# Patient Record
Sex: Female | Born: 1935 | Race: White | Hispanic: No | Smoking: Former smoker
Health system: Southern US, Community
[De-identification: ages and names within clinical notes are randomized; demographics above are authoritative.]

## PROBLEM LIST (undated history)

## (undated) DIAGNOSIS — I700 Atherosclerosis of aorta: Secondary | ICD-10-CM

## (undated) DIAGNOSIS — M1990 Unspecified osteoarthritis, unspecified site: Secondary | ICD-10-CM

## (undated) DIAGNOSIS — I5032 Chronic diastolic (congestive) heart failure: Secondary | ICD-10-CM

## (undated) DIAGNOSIS — I499 Cardiac arrhythmia, unspecified: Secondary | ICD-10-CM

## (undated) DIAGNOSIS — Z8719 Personal history of other diseases of the digestive system: Secondary | ICD-10-CM

## (undated) DIAGNOSIS — C801 Malignant (primary) neoplasm, unspecified: Secondary | ICD-10-CM

## (undated) DIAGNOSIS — I509 Heart failure, unspecified: Secondary | ICD-10-CM

## (undated) DIAGNOSIS — I4891 Unspecified atrial fibrillation: Secondary | ICD-10-CM

## (undated) DIAGNOSIS — I10 Essential (primary) hypertension: Secondary | ICD-10-CM

## (undated) DIAGNOSIS — I619 Nontraumatic intracerebral hemorrhage, unspecified: Secondary | ICD-10-CM

## (undated) DIAGNOSIS — K219 Gastro-esophageal reflux disease without esophagitis: Secondary | ICD-10-CM

## (undated) DIAGNOSIS — M4800 Spinal stenosis, site unspecified: Secondary | ICD-10-CM

## (undated) DIAGNOSIS — F419 Anxiety disorder, unspecified: Secondary | ICD-10-CM

## (undated) DIAGNOSIS — I639 Cerebral infarction, unspecified: Secondary | ICD-10-CM

## (undated) HISTORY — PX: HIP SURGERY: SHX245

## (undated) HISTORY — PX: CARPAL TUNNEL RELEASE: SHX101

## (undated) HISTORY — PX: KNEE SURGERY: SHX244

## (undated) HISTORY — PX: TONSILLECTOMY: SUR1361

## (undated) HISTORY — PX: ABDOMINAL HYSTERECTOMY: SHX81

## (undated) HISTORY — PX: ROTATOR CUFF REPAIR: SHX139

## (undated) HISTORY — PX: CHOLECYSTECTOMY: SHX55

## (undated) HISTORY — PX: KIDNEY SURGERY: SHX687

---

## 2004-08-25 ENCOUNTER — Other Ambulatory Visit: Payer: Self-pay

## 2004-08-31 ENCOUNTER — Ambulatory Visit: Payer: Self-pay | Admitting: Unknown Physician Specialty

## 2004-09-26 ENCOUNTER — Encounter: Payer: Self-pay | Admitting: Rheumatology

## 2004-09-27 ENCOUNTER — Encounter: Payer: Self-pay | Admitting: Rheumatology

## 2004-10-27 ENCOUNTER — Encounter: Payer: Self-pay | Admitting: Rheumatology

## 2004-11-27 ENCOUNTER — Encounter: Payer: Self-pay | Admitting: Rheumatology

## 2004-12-28 ENCOUNTER — Encounter: Payer: Self-pay | Admitting: Rheumatology

## 2005-01-27 ENCOUNTER — Ambulatory Visit: Payer: Self-pay | Admitting: Unknown Physician Specialty

## 2005-04-06 ENCOUNTER — Ambulatory Visit: Payer: Self-pay | Admitting: Internal Medicine

## 2005-05-04 ENCOUNTER — Ambulatory Visit: Payer: Self-pay | Admitting: Unknown Physician Specialty

## 2006-06-01 ENCOUNTER — Ambulatory Visit: Payer: Self-pay

## 2006-06-05 ENCOUNTER — Ambulatory Visit: Payer: Self-pay | Admitting: Internal Medicine

## 2006-06-07 ENCOUNTER — Ambulatory Visit: Payer: Self-pay | Admitting: Internal Medicine

## 2006-12-10 ENCOUNTER — Ambulatory Visit: Payer: Self-pay | Admitting: Internal Medicine

## 2007-12-25 ENCOUNTER — Ambulatory Visit: Payer: Self-pay | Admitting: Internal Medicine

## 2008-06-17 ENCOUNTER — Ambulatory Visit: Payer: Self-pay | Admitting: Unknown Physician Specialty

## 2009-01-04 ENCOUNTER — Ambulatory Visit: Payer: Self-pay | Admitting: Internal Medicine

## 2010-01-19 ENCOUNTER — Ambulatory Visit: Payer: Self-pay | Admitting: Internal Medicine

## 2010-10-08 ENCOUNTER — Emergency Department: Payer: Self-pay | Admitting: Emergency Medicine

## 2010-11-24 ENCOUNTER — Ambulatory Visit: Payer: Self-pay | Admitting: General Surgery

## 2010-11-25 LAB — PATHOLOGY REPORT

## 2011-02-07 ENCOUNTER — Ambulatory Visit: Payer: Self-pay | Admitting: Internal Medicine

## 2011-11-23 ENCOUNTER — Encounter: Payer: Self-pay | Admitting: Internal Medicine

## 2011-11-28 ENCOUNTER — Encounter: Payer: Self-pay | Admitting: Internal Medicine

## 2012-02-06 ENCOUNTER — Ambulatory Visit: Payer: Self-pay | Admitting: Physical Medicine and Rehabilitation

## 2012-05-15 ENCOUNTER — Ambulatory Visit: Payer: Self-pay | Admitting: Internal Medicine

## 2012-11-18 ENCOUNTER — Emergency Department: Payer: Self-pay | Admitting: Emergency Medicine

## 2012-11-19 LAB — BASIC METABOLIC PANEL
Anion Gap: 11 (ref 7–16)
BUN: 20 mg/dL — ABNORMAL HIGH (ref 7–18)
Chloride: 108 mmol/L — ABNORMAL HIGH (ref 98–107)
Co2: 22 mmol/L (ref 21–32)
Creatinine: 0.96 mg/dL (ref 0.60–1.30)
EGFR (Non-African Amer.): 55 — ABNORMAL LOW
Osmolality: 284 (ref 275–301)
Potassium: 4.1 mmol/L (ref 3.5–5.1)

## 2012-11-19 LAB — CBC
HGB: 12.9 g/dL (ref 12.0–16.0)
MCH: 31.5 pg (ref 26.0–34.0)
MCHC: 33.7 g/dL (ref 32.0–36.0)
Platelet: 208 10*3/uL (ref 150–440)
RBC: 4.1 10*6/uL (ref 3.80–5.20)
WBC: 8.2 10*3/uL (ref 3.6–11.0)

## 2012-11-19 LAB — CK TOTAL AND CKMB (NOT AT ARMC): CK, Total: 53 U/L (ref 21–215)

## 2012-11-19 LAB — TROPONIN I: Troponin-I: 0.02 ng/mL

## 2013-03-06 ENCOUNTER — Ambulatory Visit: Payer: Self-pay | Admitting: General Practice

## 2013-03-06 LAB — BASIC METABOLIC PANEL
Anion Gap: 4 — ABNORMAL LOW (ref 7–16)
BUN: 21 mg/dL — ABNORMAL HIGH (ref 7–18)
Calcium, Total: 9.1 mg/dL (ref 8.5–10.1)
Chloride: 100 mmol/L (ref 98–107)
EGFR (African American): >60
EGFR (Non-African Amer.): >60
Glucose: 127 mg/dL — ABNORMAL HIGH (ref 65–99)
Osmolality: 277 (ref 275–301)
Potassium: 3.5 mmol/L (ref 3.5–5.1)

## 2013-03-06 LAB — HEMOGLOBIN: HGB: 13.4 g/dL (ref 12.0–16.0)

## 2013-03-12 ENCOUNTER — Ambulatory Visit: Payer: Self-pay | Admitting: General Practice

## 2013-05-28 ENCOUNTER — Ambulatory Visit: Payer: Self-pay | Admitting: Internal Medicine

## 2013-09-17 ENCOUNTER — Ambulatory Visit: Payer: Self-pay | Admitting: Unknown Physician Specialty

## 2014-06-02 ENCOUNTER — Ambulatory Visit: Payer: Self-pay | Admitting: Internal Medicine

## 2014-06-04 ENCOUNTER — Ambulatory Visit: Payer: Self-pay | Admitting: Internal Medicine

## 2014-06-18 ENCOUNTER — Ambulatory Visit: Payer: Self-pay | Admitting: General Practice

## 2014-06-30 ENCOUNTER — Emergency Department: Payer: Self-pay | Admitting: Emergency Medicine

## 2014-06-30 LAB — BASIC METABOLIC PANEL
Anion Gap: 10 (ref 7–16)
BUN: 14 mg/dL (ref 7–18)
CALCIUM: 9.1 mg/dL (ref 8.5–10.1)
CHLORIDE: 98 mmol/L (ref 98–107)
CREATININE: 0.86 mg/dL (ref 0.60–1.30)
Co2: 27 mmol/L (ref 21–32)
EGFR (African American): >60
EGFR (Non-African Amer.): >60
GLUCOSE: 104 mg/dL — AB (ref 65–99)
Osmolality: 271 (ref 275–301)
POTASSIUM: 3.7 mmol/L (ref 3.5–5.1)
Sodium: 135 mmol/L — ABNORMAL LOW (ref 136–145)

## 2014-06-30 LAB — CBC
HCT: 38.4 % (ref 35.0–47.0)
HGB: 12.8 g/dL (ref 12.0–16.0)
MCH: 31.8 pg (ref 26.0–34.0)
MCHC: 33.3 g/dL (ref 32.0–36.0)
MCV: 96 fL (ref 80–100)
PLATELETS: 193 10*3/uL (ref 150–440)
RBC: 4.01 10*6/uL (ref 3.80–5.20)
RDW: 13 % (ref 11.5–14.5)
WBC: 6 10*3/uL (ref 3.6–11.0)

## 2014-06-30 LAB — TROPONIN I

## 2014-06-30 LAB — PRO B NATRIURETIC PEPTIDE: B-Type Natriuretic Peptide: 1521 pg/mL — ABNORMAL HIGH (ref 0–450)

## 2014-08-19 ENCOUNTER — Ambulatory Visit: Payer: Self-pay | Admitting: General Practice

## 2014-08-19 LAB — CBC WITH DIFFERENTIAL/PLATELET
BASOS PCT: 0.4 %
Basophil #: 0 10*3/uL (ref 0.0–0.1)
EOS PCT: 0.3 %
Eosinophil #: 0 10*3/uL (ref 0.0–0.7)
HCT: 38.9 % (ref 35.0–47.0)
HGB: 12.9 g/dL (ref 12.0–16.0)
LYMPHS ABS: 1.7 10*3/uL (ref 1.0–3.6)
Lymphocyte %: 27.7 %
MCH: 31.4 pg (ref 26.0–34.0)
MCHC: 33.1 g/dL (ref 32.0–36.0)
MCV: 95 fL (ref 80–100)
MONO ABS: 0.6 x10 3/mm (ref 0.2–0.9)
Monocyte %: 9.2 %
NEUTROS ABS: 3.8 10*3/uL (ref 1.4–6.5)
Neutrophil %: 62.4 %
PLATELETS: 221 10*3/uL (ref 150–440)
RBC: 4.11 10*6/uL (ref 3.80–5.20)
RDW: 13.3 % (ref 11.5–14.5)
WBC: 6.2 10*3/uL (ref 3.6–11.0)

## 2014-08-26 ENCOUNTER — Ambulatory Visit: Payer: Self-pay | Admitting: General Practice

## 2015-03-19 NOTE — Op Note (Signed)
PATIENT NAME:  Theresa Harris, Theresa Harris MR#:  [id number] DATE OF BIRTH:  07-Apr-1927  DATE OF PROCEDURE:  03/12/2013  PREOPERATIVE DIAGNOSIS: Left carpal tunnel syndrome.   POSTOPERATIVE DIAGNOSIS: Left carpal tunnel syndrome.  PROCEDURE PERFORMED: Left carpal tunnel release.   SURGEON: Laurice Record. Hooten, MD  ANESTHESIA: General.   ESTIMATED BLOOD LOSS: Minimal.   FLUIDS REPLACED:  550 mL of crystalloid.   DRAINS: None.   TOURNIQUET TIME:  26 minutes.   INDICATIONS FOR SURGERY:   The patient is an 81 year old female who has been seen for complaints of left hand numbness and tingling. She demonstrated decreased sensation in the median nerve distribution with no significant improvement despite splinting. After discussion of the risks and benefits of surgical intervention, the patient expressed understanding of the risks, benefits and agreed with plans for left carpal tunnel release.   PROCEDURE IN DETAIL: The patient was brought into the operating room and after adequate general anesthesia was achieved, a tourniquet was placed on the patient's upper left arm. The patient's left hand and arm were cleaned and prepped with alcohol and DuraPrep draped in the usual sterile fashion. A "timeout" was performed as per usual protocol. Loupe magnification was used throughout the procedure. The left upper extremity was exsanguinated using an Esmarch, and the tourniquet was inflated to 250 mmHg. Curvilinear incision was made just ulnar to the thenar palmar crease. Dissection was carried down through the palmar fascia to the transverse carpal ligament. The transverse carpal ligament was sharply incised, taking care to protect the underlying structures within the carpal tunnel.  A complete release of the transverse carpal ligament was achieved. Inspection of the carpal tunnel and showed no evidence of ganglion cyst or lipoma. The median nerve had an hourglass type appearance under the extent of the transverse carpal  ligament and this improved after decompression.   The wound was irrigated with copious amounts of normal saline with antibiotic solution. Skin edges were reapproximated using interrupted sutures of 5-0 nylon. 10 mL of 0.25% Marcaine were injected along the incision line. A sterile dressing was applied followed by application of a volar splint. Tourniquet was deflated after total tourniquet time of 26 minutes. The patient tolerated procedure well. She was transported to the recovery room in stable condition.     ____________________________ Laurice Record. Holley Bouche., MD jph:ct D: 03/12/2013 08:43:51 ET T: 03/12/2013 09:00:07 ET JOB#: [id number]  cc: Jeneen Rinks P. Holley Bouche., MD, <Dictator> JAMES P Holley Bouche MD ELECTRONICALLY SIGNED 03/12/2013 20:19

## 2015-03-20 NOTE — Consult Note (Signed)
PATIENT NAME:  Theresa Harris, Theresa Harris MR#:  [id number] DATE OF BIRTH:  1927-01-24  DATE OF CONSULTATION:  08/26/2014   CONSULTING PHYSICIAN:  Epifanio Lesches, MD  PRIMARY DOCTOR:  Dr. Ouida Sills.    CONSULTATION REQUESTING PHYSICIAN:  Dr. Marry Guan.    REASON FOR CONSULTATION:  Atrial fibrillation.    HISTORY OF PRESENT ILLNESS: The patient is an 81 year old female patient with right knee arthroscopy seen by Dr. Marry Guan, supposed to go home, developed atrial fibrillation. The patient never had RVR and heart rate is controlled around 60 and the patient's rhythm strips showed she developed atrial fibrillation in PACU. The patient is asymptomatic and she is a recovering from arthroscopy. The patient's vitals, blood pressure is 170/80. Rhythm strips reviewed, she had multiple rhythm strips and EKG, EKG showed atrial fibrillation and rate of 68 beats per minute. The patient EKG results reviewed and we contacted Dr. Clayborn Bigness, he recommended that the patient can go home on aspirin 325 mg daily in addition to her medications and they will see the patient in the office. The patient's primary cardiologist is Dr. Ubaldo Glassing.   PAST MEDICAL HISTORY: Significant for hypertension, anxiety, history of ventricular bigeminy before.    ALLERGIES: MORPHINE.   PAST SURGICAL HISTORY: Significant for hysterectomy, cataract operation, gallbladder surgery, right kidney operation, appendectomy.   MEDICATIONS:  At home she is on Tylenol 500 mg every 4 hours as needed for pain, estradiol 1 mg p.o. daily, HCTZ 25 mg p.o. daily, magnesium 250 mg p.o. daily, metoprolol succinate 25 mg p.o. every other day, omeprazole 20 mg p.o. by mouth daily, oxazepam 15 mg p.o. daily as needed for anxiety, triamcinolone nasal spray 2 sprays in nostril daily.   FAMILY HISTORY: No hypertension or diabetes.   REVIEW OF SYSTEMS.  CONSTITUTIONAL: Denies any fever or fatigue.  EYES: No blurred vision. EARS, NOSE, AND THROAT: No tinnitus. No ear pain. No  epistaxis. No difficulty swallowing.  RESPIRATIONS: No cough. No wheezing.  CARDIOVASCULAR: No chest pain.  No orthopnea.  GASTROINTESTINAL: No nausea.  No vomiting. No abdominal pain.   GENITOURINARY: No dysuria.  ENDOCRINE: No polyuria or nocturia.  HEMATOLOGIC: No anemia.  INTEGUMENTARY: No skin rash.   MUSCULOSKELETAL:  The patient had a right knee arthroscopic.  PSYCHIATRIC: Denies any history of anxiety.   PHYSICAL EXAMINATION:  VITAL SIGNS: Temperature 98 Fahrenheit, heart rate around 64, blood pressure 170/60. The patient's heart rate averages around 60.   GENERAL:  Alert, awake, oriented, elderly female, not in distress, answering questions appropriately.  HEENT: Head is normocephalic.  EYES: Pupils equal, reacting to light. Extraocular movements are intact.  EARS, NOSE, AND THROAT: No turbinate hypertrophy.  No tympanic membrane congestion. No lymphadenopathy.  NECK: Supple. No JVD. No carotid bruit.   CARDIOVASCULAR: S1, S2 irregularly irregular.  Good pedal pulses present. No peripheral edema.  ABDOMEN: Soft, nontender, nondistended. Bowel sounds present.  MUSCULOSKELETAL: Strength 5/5 in upper and lower extremities. No skin rashes.  NEUROLOGIC: Cranial nerves II-XII intact. 4 out of 5 upper and lower extremity strength. Sensation intact. DTRs 2 + bilaterally.  PSYCHIATRIC: The patient is slightly anxious.   EKG showed atrial fibrillation with rate of 64 beats per minute.   ASSESSMENT AND PLAN: The patient is an 81 year old female with atrial fibrillation postoperatively. The patient's rate is controlled and she is asymptomatic and hemodynamically stable. I spoke with Dr. Clayborn Bigness, he recommended to discharge her and he will see her in the office and he also said the patient can have aspirin 325  mg daily in addition to her medications. Her CHADS2 score is 2.  The patient will be seen by Dr. Ubaldo Glassing or Dr. Clayborn Bigness in the office tomorrow and we have faxed the EKG to his office  number.  The patient's previous EKGs are compared and she has a history of ventricular bigeminy and some PVCs before on this EKG when compared with August 4 EKG.    TIME SPENT: About 50 minutes.   The patient can be discharged from PACU.    ____________________________ Epifanio Lesches, MD sk:bu D: 08/26/2014 18:39:01 ET T: 08/26/2014 19:20:06 ET JOB#: [id number]  cc: Epifanio Lesches, MD, <Dictator> Epifanio Lesches MD ELECTRONICALLY SIGNED 09/19/2014 18:34

## 2015-03-20 NOTE — Op Note (Signed)
PATIENT NAME:  Theresa Harris, HAGANS MR#:  [id number] DATE OF BIRTH:  [birth year]  DATE OF PROCEDURE:  08/26/2014  PREOPERATIVE DIAGNOSIS: Internal derangement of the right knee.   POSTOPERATIVE DIAGNOSES: 1.  Tear of the anterior and posterior horns of the lateral meniscus, right knee.  2.  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   PROCEDURES PERFORMED: Right knee arthroscopy, partial lateral meniscectomy, and medial chondroplasty.   SURGEON: Laurice Record. Holley Bouche., MD.   ANESTHESIA: General.   ESTIMATED BLOOD LOSS: Minimal.   TOURNIQUET TIME: Not used.   DRAINS: None.   INDICATIONS FOR SURGERY: The patient is an 81 year old female who has been seen for complaints of right knee pain and swelling. MRI demonstrated findings suggestive of meniscal pathology. After discussion of risks and benefits of surgical intervention, the patient expressed understanding of the risks, benefits, and agreed with plans for surgical intervention.   PROCEDURE IN DETAIL: The patient was brought to the Operating Room and after adequate general anesthesia was achieved, a tourniquet was placed on the patient's right thigh, and the leg was placed in a leg holder. All bony prominences were well padded. The patient's right knee and leg were cleaned and prepped with alcohol and DuraPrep draped in the usual sterile fashion. A "timeout" was performed as per usual protocol. The anticipated portal sites were injected with 0.25% Marcaine with epinephrine. An anterolateral portal was created and a cannula was inserted. A moderate effusion was evacuated. The scope was inserted and the knee was distended with fluid using the pump. The scope was advanced down the medial gutter into the medial compartment of the knee. Under visualization with the scope, an anteromedial portal was created and a hook probe was inserted. Inspection of the medial compartment demonstrated some grade 2 to early grade 3 changes of  chondromalacia involving both the femur and tibia. These areas were debrided and contoured using the 50-degree ArthroCare wand. The medial meniscus was visualized and probed and felt to be stable. Some mild fraying was noted along the inner aspect of the meniscus, and this was freed using the 50-degree ArthroCare wand. The scope was advanced into the intracondylar region. The anterior cruciate ligament was visualized and probed and felt to be stable. The scope was removed from the anterolateral portal and reinserted via the anteromedial portal so as to better visualize the lateral compartment. There was a complex tear of the lateral meniscus involving both the anterior and posterior horns. The tear was debrided using meniscal punches and a 4.5 mm shaver. Additional contouring and debridement was performed using a 50-degree ArthroCare wand. The remaining rim of meniscus was visualized and probed and felt to be stable. There was some mild fraying of the articular surface. This was debrided using the ArthroCare wand. Finally, the scope was positioned so as to visualize the patellofemoral articulation. Mild chondral changes were noted. Good patellar tracking was appreciated.   The knee was irrigated with copious amounts of fluid and then suctioned dry. The anterolateral portal was reapproximated using 3-0 nylon. A combination of 0.25% Marcaine with epinephrine and 4 mg morphine was injected via the scope. The scope was removed and the anteromedial portal was reapproximated using 3-0 nylon. A sterile dressing was applied followed by application of an ice wrap. The patient tolerated the procedure well. She was transported to the recovery room in stable condition.   ____________________________ Laurice Record. Holley Bouche., MD jph:at D: 08/26/2014 18:40:05 ET T: 08/26/2014 18:56:34 ET JOB#: [id number]  cc: Laurice Record. Holley Bouche., MD, <Dictator> JAMES P Holley Bouche MD ELECTRONICALLY SIGNED 08/27/2014 23:14

## 2015-10-22 ENCOUNTER — Emergency Department: Payer: PPO

## 2015-10-22 ENCOUNTER — Inpatient Hospital Stay (HOSPITAL_COMMUNITY)
Admission: AD | Admit: 2015-10-22 | Discharge: 2015-10-27 | DRG: 065 | Disposition: A | Discharge status: Rehabilitation Facility | Payer: PPO | Source: Other Acute Inpatient Hospital | Attending: Neurology | Admitting: Neurology

## 2015-10-22 ENCOUNTER — Encounter: Payer: Self-pay | Admitting: Emergency Medicine

## 2015-10-22 ENCOUNTER — Emergency Department
Admission: EM | Admit: 2015-10-22 | Discharge: 2015-10-22 | Disposition: A | Discharge status: Other Acute Inpatient Hospital | Payer: PPO | Attending: Emergency Medicine | Admitting: Emergency Medicine

## 2015-10-22 DIAGNOSIS — Y9389 Activity, other specified: Secondary | ICD-10-CM | POA: Insufficient documentation

## 2015-10-22 DIAGNOSIS — Z79899 Other long term (current) drug therapy: Secondary | ICD-10-CM

## 2015-10-22 DIAGNOSIS — I639 Cerebral infarction, unspecified: Secondary | ICD-10-CM

## 2015-10-22 DIAGNOSIS — D329 Benign neoplasm of meninges, unspecified: Secondary | ICD-10-CM

## 2015-10-22 DIAGNOSIS — Z79890 Hormone replacement therapy (postmenopausal): Secondary | ICD-10-CM

## 2015-10-22 DIAGNOSIS — D320 Benign neoplasm of cerebral meninges: Secondary | ICD-10-CM | POA: Diagnosis present

## 2015-10-22 DIAGNOSIS — I5022 Chronic systolic (congestive) heart failure: Secondary | ICD-10-CM | POA: Diagnosis present

## 2015-10-22 DIAGNOSIS — I5032 Chronic diastolic (congestive) heart failure: Secondary | ICD-10-CM | POA: Diagnosis not present

## 2015-10-22 DIAGNOSIS — R197 Diarrhea, unspecified: Secondary | ICD-10-CM | POA: Diagnosis present

## 2015-10-22 DIAGNOSIS — S8991XA Unspecified injury of right lower leg, initial encounter: Secondary | ICD-10-CM | POA: Insufficient documentation

## 2015-10-22 DIAGNOSIS — I610 Nontraumatic intracerebral hemorrhage in hemisphere, subcortical: Principal | ICD-10-CM | POA: Diagnosis present

## 2015-10-22 DIAGNOSIS — S8992XA Unspecified injury of left lower leg, initial encounter: Secondary | ICD-10-CM | POA: Insufficient documentation

## 2015-10-22 DIAGNOSIS — S199XXA Unspecified injury of neck, initial encounter: Secondary | ICD-10-CM | POA: Diagnosis not present

## 2015-10-22 DIAGNOSIS — F419 Anxiety disorder, unspecified: Secondary | ICD-10-CM | POA: Diagnosis present

## 2015-10-22 DIAGNOSIS — I10 Essential (primary) hypertension: Secondary | ICD-10-CM | POA: Diagnosis present

## 2015-10-22 DIAGNOSIS — M4800 Spinal stenosis, site unspecified: Secondary | ICD-10-CM | POA: Diagnosis present

## 2015-10-22 DIAGNOSIS — R29703 NIHSS score 3: Secondary | ICD-10-CM | POA: Diagnosis present

## 2015-10-22 DIAGNOSIS — Z96642 Presence of left artificial hip joint: Secondary | ICD-10-CM | POA: Diagnosis not present

## 2015-10-22 DIAGNOSIS — I671 Cerebral aneurysm, nonruptured: Secondary | ICD-10-CM

## 2015-10-22 DIAGNOSIS — R270 Ataxia, unspecified: Secondary | ICD-10-CM | POA: Diagnosis not present

## 2015-10-22 DIAGNOSIS — R739 Hyperglycemia, unspecified: Secondary | ICD-10-CM | POA: Diagnosis present

## 2015-10-22 DIAGNOSIS — W11XXXA Fall on and from ladder, initial encounter: Secondary | ICD-10-CM | POA: Diagnosis not present

## 2015-10-22 DIAGNOSIS — Y9289 Other specified places as the place of occurrence of the external cause: Secondary | ICD-10-CM | POA: Diagnosis not present

## 2015-10-22 DIAGNOSIS — R52 Pain, unspecified: Secondary | ICD-10-CM

## 2015-10-22 DIAGNOSIS — I6789 Other cerebrovascular disease: Secondary | ICD-10-CM | POA: Diagnosis not present

## 2015-10-22 DIAGNOSIS — M6281 Muscle weakness (generalized): Secondary | ICD-10-CM | POA: Diagnosis present

## 2015-10-22 DIAGNOSIS — S4992XA Unspecified injury of left shoulder and upper arm, initial encounter: Secondary | ICD-10-CM | POA: Insufficient documentation

## 2015-10-22 DIAGNOSIS — S4991XA Unspecified injury of right shoulder and upper arm, initial encounter: Secondary | ICD-10-CM | POA: Diagnosis not present

## 2015-10-22 DIAGNOSIS — G4762 Sleep related leg cramps: Secondary | ICD-10-CM | POA: Diagnosis not present

## 2015-10-22 DIAGNOSIS — I69898 Other sequelae of other cerebrovascular disease: Secondary | ICD-10-CM | POA: Diagnosis not present

## 2015-10-22 DIAGNOSIS — I619 Nontraumatic intracerebral hemorrhage, unspecified: Secondary | ICD-10-CM | POA: Insufficient documentation

## 2015-10-22 DIAGNOSIS — Y998 Other external cause status: Secondary | ICD-10-CM | POA: Insufficient documentation

## 2015-10-22 DIAGNOSIS — Z87891 Personal history of nicotine dependence: Secondary | ICD-10-CM | POA: Diagnosis not present

## 2015-10-22 DIAGNOSIS — S06340A Traumatic hemorrhage of right cerebrum without loss of consciousness, initial encounter: Secondary | ICD-10-CM | POA: Diagnosis not present

## 2015-10-22 DIAGNOSIS — I509 Heart failure, unspecified: Secondary | ICD-10-CM | POA: Diagnosis not present

## 2015-10-22 DIAGNOSIS — R208 Other disturbances of skin sensation: Secondary | ICD-10-CM | POA: Diagnosis not present

## 2015-10-22 HISTORY — DX: Essential (primary) hypertension: I10

## 2015-10-22 HISTORY — DX: Heart failure, unspecified: I50.9

## 2015-10-22 HISTORY — DX: Anxiety disorder, unspecified: F41.9

## 2015-10-22 HISTORY — DX: Spinal stenosis, site unspecified: M48.00

## 2015-10-22 LAB — PROTIME-INR
INR: 1.08
PROTHROMBIN TIME: 14.2 s (ref 11.4–15.0)

## 2015-10-22 LAB — COMPREHENSIVE METABOLIC PANEL
ALBUMIN: 4 g/dL (ref 3.5–5.0)
ALT: 12 U/L — ABNORMAL LOW (ref 14–54)
ANION GAP: 8 (ref 5–15)
AST: 25 U/L (ref 15–41)
Alkaline Phosphatase: 54 U/L (ref 38–126)
BILIRUBIN TOTAL: 0.4 mg/dL (ref 0.3–1.2)
BUN: 20 mg/dL (ref 6–20)
CALCIUM: 9.4 mg/dL (ref 8.9–10.3)
CO2: 27 mmol/L (ref 22–32)
Chloride: 102 mmol/L (ref 101–111)
Creatinine, Ser: 0.72 mg/dL (ref 0.44–1.00)
Glucose, Bld: 141 mg/dL — ABNORMAL HIGH (ref 65–99)
POTASSIUM: 3.5 mmol/L (ref 3.5–5.1)
Sodium: 137 mmol/L (ref 135–145)
TOTAL PROTEIN: 7.2 g/dL (ref 6.5–8.1)

## 2015-10-22 LAB — DIFFERENTIAL
BASOS PCT: 0 %
Basophils Absolute: 0 10*3/uL (ref 0–0.1)
EOS ABS: 0.1 10*3/uL (ref 0–0.7)
Eosinophils Relative: 1 %
Lymphocytes Relative: 26 %
Lymphs Abs: 2 10*3/uL (ref 1.0–3.6)
MONO ABS: 0.8 10*3/uL (ref 0.2–0.9)
MONOS PCT: 11 %
NEUTROS ABS: 4.6 10*3/uL (ref 1.4–6.5)
Neutrophils Relative %: 62 %

## 2015-10-22 LAB — CBC
HEMATOCRIT: 39.1 % (ref 35.0–47.0)
Hemoglobin: 13 g/dL (ref 12.0–16.0)
MCH: 30.3 pg (ref 26.0–34.0)
MCHC: 33.2 g/dL (ref 32.0–36.0)
MCV: 91.3 fL (ref 80.0–100.0)
PLATELETS: 201 10*3/uL (ref 150–440)
RBC: 4.28 MIL/uL (ref 3.80–5.20)
RDW: 13.7 % (ref 11.5–14.5)
WBC: 7.4 10*3/uL (ref 3.6–11.0)

## 2015-10-22 LAB — MRSA PCR SCREENING: MRSA BY PCR: NEGATIVE

## 2015-10-22 LAB — APTT: aPTT: 31 seconds (ref 24–36)

## 2015-10-22 MED ORDER — ACETAMINOPHEN 650 MG RE SUPP: 650.0000 mg | RECTAL | Status: DC | PRN

## 2015-10-22 MED ORDER — SENNOSIDES-DOCUSATE SODIUM 8.6-50 MG PO TABS
1.0000 | ORAL_TABLET | Freq: Two times a day (BID) | ORAL | Status: DC
Start: 2015-10-22 — End: 2015-10-25
  Administered 2015-10-23: 1 via ORAL
  Filled 2015-10-22 (×4): qty 1

## 2015-10-22 MED ORDER — SODIUM CHLORIDE 0.9 % IV SOLN
INTRAVENOUS | Status: DC
  Administered 2015-10-22: 22:00:00 via INTRAVENOUS

## 2015-10-22 MED ORDER — STROKE: EARLY STAGES OF RECOVERY BOOK
Freq: Once | Status: DC
  Filled 2015-10-22: qty 1

## 2015-10-22 MED ORDER — LABETALOL HCL 5 MG/ML IV SOLN
10.0000 mg | Freq: Once | INTRAVENOUS | Status: AC
  Administered 2015-10-22: 10 mg via INTRAVENOUS
  Filled 2015-10-22: qty 4

## 2015-10-22 MED ORDER — LABETALOL HCL 5 MG/ML IV SOLN
10.0000 mg | INTRAVENOUS | Status: DC | PRN
  Administered 2015-10-23: 10 mg via INTRAVENOUS
  Administered 2015-10-23: 20 mg via INTRAVENOUS
  Filled 2015-10-22 (×2): qty 4

## 2015-10-22 MED ORDER — LORAZEPAM 2 MG/ML IJ SOLN
1.0000 mg | Freq: Once | INTRAMUSCULAR | Status: AC
  Administered 2015-10-22: 1 mg via INTRAVENOUS
  Filled 2015-10-22: qty 1

## 2015-10-22 MED ORDER — ACETAMINOPHEN 325 MG PO TABS
650.0000 mg | ORAL_TABLET | ORAL | Status: DC | PRN
  Administered 2015-10-22 – 2015-10-23 (×2): 650 mg via ORAL
  Administered 2015-10-24: 325 mg via ORAL
  Administered 2015-10-26: 650 mg via ORAL
  Filled 2015-10-22 (×4): qty 2

## 2015-10-22 MED ORDER — PANTOPRAZOLE SODIUM 40 MG IV SOLR
40.0000 mg | Freq: Every day | INTRAVENOUS | Status: DC
Start: 2015-10-22 — End: 2015-10-23
  Administered 2015-10-22: 40 mg via INTRAVENOUS
  Filled 2015-10-22: qty 40

## 2015-10-22 NOTE — ED Provider Notes (Signed)
Time Seen: Approximately 1610 I have reviewed the triage notes  Chief Complaint: Code Stroke   History of Present Illness: Theresa Harris is a 81 y.o. female who presents after she fell from a short small ladder. Patient states that she was putting stuff in a cabinet and her leg gave out. She initially had some left-sided weakness in both upper and lower extremity and some slurred speech. She still has trouble with left leg weakness however the speech and left arm weakness has corrected. She was apparently on the floor for an unknown period of time. She denies any loss of consciousness. She denies any thoracic or lumbar spine pain but does have some discomfort at the base of her cervical spine. She was transported here by EMS uneventfully. She denies any headache, nausea, vomiting. She denies being on any current blood thinners. Past Medical History  Diagnosis Date  . Spinal stenosis   . CHF (congestive heart failure) ([place])   . Anxiety   . HTN (hypertension)     Patient Active Problem List   Diagnosis Date Noted  . ICH (intracerebral hemorrhage) ([place]) 10/22/2015    Past Surgical History  Procedure Laterality Date  . Knee surgery      right  . Abdominal hysterectomy    . Hip surgery    . Cholecystectomy    . Carpal tunnel release      Past Surgical History  Procedure Laterality Date  . Knee surgery      right  . Abdominal hysterectomy    . Hip surgery    . Cholecystectomy    . Carpal tunnel release      No current outpatient prescriptions on file.  Allergies:  Morphine and related  Family History: No family history on file.  Social History: Social History  Substance Use Topics  . Smoking status: Former Research scientist (life sciences)  . Smokeless tobacco: None  . Alcohol Use: No     Review of Systems:   10 point review of systems was performed and was otherwise negative:  Constitutional: No fever Eyes: No visual disturbances ENT: No sore throat, ear pain Cardiac: No chest  pain Respiratory: No shortness of breath, wheezing, or stridor Abdomen: No abdominal pain, no vomiting, No diarrhea Endocrine: No weight loss, No night sweats Extremities: No peripheral edema, cyanosis Skin: No rashes, easy bruising Neurologic: Focal weakness mentioned above which is improving. She denies any trouble swallowing. Urologic: No dysuria, Hematuria, or urinary frequency   Physical Exam:  ED Triage Vitals  Enc Vitals Group     BP 10/22/15 1555 188/45 mmHg     Pulse Rate 10/22/15 1555 56     Resp 10/22/15 1555 13     Temp 10/22/15 1555 97.4 F (36.3 C)     Temp Source 10/22/15 1555 Oral     SpO2 10/22/15 1555 99 %     Weight 10/22/15 1555 126 lb (57.153 kg)     Height 10/22/15 1555 5' (1.524 m)     Head Cir --      Peak Flow --      Pain Score 10/22/15 1556 9     Pain Loc --      Pain Edu? --      Excl. in [place]? --     General: Awake , Alert , and Oriented times 3; GCS 49fcial asymmetry. Head: Normal cephalic , atraumatic Eyes: Pupils equal , round, reactive to light Nose/Throat: No nasal drainage, patent upper airway without erythema or exudate.  Neck: Mild tenderness at the base of the cervical spine with no crepitus or step-off noted. No neuropraxia Lungs: Clear to ascultation without wheezes , rhonchi, or rales Heart: Regular rate, regular rhythm without murmurs , gallops , or rubs Abdomen: Soft, non tender without rebound, guarding , or rigidity; bowel sounds positive and symmetric in all 4 quadrants. No organomegaly .        Extremities: 2 plus symmetric pulses. No edema, clubbing or cyanosis Neurologic: Patient has left lower extremity weakness compared to the right. The left upper and right upper extremity appeared abnormal 5 out of 5 strength. 3 out of 5 in the left lower extremity, 5 out of 5 in the right lower extremity. Skin: warm, dry, no rashes   Labs:   All laboratory work was reviewed including any pertinent negatives or positives listed below:   Labs Reviewed  COMPREHENSIVE METABOLIC PANEL - Abnormal; Notable for the following:    Glucose, Bld 141 (*)    ALT 12 (*)    All other components within normal limits  CBC  DIFFERENTIAL  PROTIME-INR  APTT  CBG MONITORING, ED   laboratory work was reviewed and shows no significant findings  EKG:  ED ECG REPORT I, Daymon Larsen, the attending physician, personally viewed and interpreted this ECG.  Date: 10/22/2015 EKG Time: 1549 Rate: 67 Rhythm: normal sinus rhythm QRS Axis: normal Intervals: Right bundle-branch block pattern ST/T Wave abnormalities: normal Conduction Disutrbances: none Narrative Interpretation: unremarkable No acute ischemic changes noted  Radiology: EXAM: CT HEAD WITHOUT CONTRAST  CT CERVICAL SPINE WITHOUT CONTRAST  TECHNIQUE: Multidetector CT imaging of the head and cervical spine was performed following the standard protocol without intravenous contrast. Multiplanar CT image reconstructions of the cervical spine were also generated.  COMPARISON: MRI of the cervical spine 02/06/2012.  FINDINGS: CT HEAD FINDINGS  An acute hemorrhagic infarct is present in the posterior limb right internal capsule and thalamus measuring 14 x 9 mm. No other acute hemorrhage is present. A calcified extra-axial mass adjacent of the right frontal lobe measures 15 mm, compatible with a meningioma. There is a remote lacunar infarct in the left lentiform nucleus.  Mild periventricular white matter hypoattenuation is present. There is blood in the right lateral ventricle without hydrocephalus.  Atherosclerotic calcifications are present in the cavernous internal carotid arteries bilaterally. The paranasal sinuses and mastoid air cells are clear. The calvarium is intact. No significant extracranial soft tissue lesions are present. Bilateral lens replacements are present. The globes and orbits are otherwise intact.  CT CERVICAL SPINE FINDINGS  The cervical spine  is imaged from the skullbase through T1-2. Vertebral body heights alignment are maintained. There is chronic loss of disc height at each level from C3-4 through C7-T1. Facet hypertrophy is noted. Osseous foraminal narrowing is present on the left at C3-4. Mild to moderate foraminal narrowing is present bilaterally from C4-5 through C7-T1. No acute fracture or traumatic subluxation is evident.  Atherosclerotic calcifications are present proximally in the great vessels. Atherosclerotic changes are also present at the carotid bifurcations bilaterally without definite stenosis. Scarring is present at the lung apices. The soft tissues are otherwise unremarkable. Calcifications within the posterior cervical musculature at C5 and C6 is likely related to prior trauma.  IMPRESSION: 1. 14 x 9 mm acute hemorrhagic infarct involving the posterior limb right internal capsule and right thalamus. This is likely a hypertensive hemorrhage. For 2. Extension of hemorrhage into the right lateral ventricle without hydrocephalus. 3. Remote nonhemorrhagic infarct of the  left lentiform nucleus. 4. 15 mm right frontal meningioma. 5. Advanced degenerative changes in the cervical spine without evidence for acute fracture or traumatic subluxation. These results were called by telephone at the time of interpretation on 10/22/2015 at 3:54 pm to Dr. Lenise Arena , who verbally acknowledged these results.   Electronically Signed       I personally reviewed the radiologic studies    Critical Care: CRITICAL CARE Performed by: Daymon Larsen   Total critical care time: 33 minutes minutes  Critical care time was exclusive of separately billable procedures and treating other patients.  Critical care was necessary to treat or prevent imminent or life-threatening deterioration.  Critical care was time spent personally by me on the following activities: development of treatment plan with patient  and/or surrogate as well as nursing, discussions with consultants, evaluation of patient's response to treatment, examination of patient, obtaining history from patient or surrogate, ordering and performing treatments and interventions, ordering and review of laboratory studies, ordering and review of radiographic studies, pulse oximetry and re-evaluation of patient's condition. Management of acute hemorrhagic stroke    ED Course: Patient arrives hypertensive and was given labetalol 10 mg IV. Patient was also given some Ativan which also seemed to help with her blood pressure. Her findings were reviewed with her at the bedside. She was initially called as a code stroke and had immediate head CT evaluation. Patient will require transfer and I spoke to the neurologist at [facility] accepted [facility]. Patient will be transferred by critical care unit.   Assessment:  Hemorrhagic stroke  Final Clinical Impression:  Final diagnoses:  Hemorrhagic stroke [facility])     Plan: * Transferred to [place], MD 10/22/15 [id number]

## 2015-10-22 NOTE — H&P (Signed)
Admission H&P    Chief Complaint: Numbness involving left side of the face and left upper extremity as well as weakness and coordination difficulty of left upper extremity.  HPI: Theresa Harris is an 81 y.o. female history of hypertension, congestive heart failure, spinal stenosis and anxiety, transferred from [facility] with an acute hemorrhage or parenchymal right cerebral hemorrhage. Patient was last known well at about 1300 today. She was standing on a step stool at the time of symptom onset and fell to her left side. CT scan of her head showed a small to parenchymal hemorrhage involving right thalamus and internal capsule. There was no significant mass effect. NIH stroke score was 3. Patient has no previous history of stroke or TIA.  LSN: 1 PM on 10/22/2015 tPA Given: No: Acute ICH mRankin:  Past Medical History  Diagnosis Date  . Spinal stenosis   . CHF (congestive heart failure) ([place])   . Anxiety   . HTN (hypertension)     Past Surgical History  Procedure Laterality Date  . Knee surgery      right  . Abdominal hysterectomy    . Hip surgery    . Cholecystectomy    . Carpal tunnel release      No family history on file. Social History:  reports that she has quit smoking. She does not have any smokeless tobacco history on file. She reports that she does not drink alcohol or use illicit drugs.  Allergies:  Allergies  Allergen Reactions  . Morphine And Related Nausea And Vomiting    Medications Prior to Admission  Medication Sig Dispense Refill  . acetaminophen (TYLENOL) 500 MG tablet Take 500 mg by mouth every 6 (six) hours as needed for mild pain.    Marland Kitchen b complex vitamins tablet Take 1 tablet by mouth daily.    . Calcium Carbonate-Vitamin D (CALCIUM 600+D) 600-400 MG-UNIT tablet Take 1 tablet by mouth 2 (two) times daily.    Marland Kitchen estradiol (ESTRACE) 1 MG tablet Take 0.5 mg by mouth daily.     . hydrochlorothiazide (HYDRODIURIL) 25 MG tablet Take 25 mg by mouth daily.    .  magnesium oxide (MAG-OX) 400 MG tablet Take 400 mg by mouth daily.    . metoprolol succinate (TOPROL-XL) 25 MG 24 hr tablet Take 25 mg by mouth 2 (two) times daily.    Marland Kitchen omeprazole (PRILOSEC) 20 MG capsule Take 20 mg by mouth 2 (two) times daily.    Marland Kitchen oxazepam (SERAX) 15 MG capsule Take 15 mg by mouth 3 (three) times daily as needed for sleep.    Marland Kitchen tiZANidine (ZANAFLEX) 2 MG tablet Take 2 mg by mouth every 8 (eight) hours as needed for muscle spasms.    Marland Kitchen triamcinolone (NASACORT ALLERGY 24HR) 55 MCG/ACT AERO nasal inhaler Place 2 sprays into the nose daily as needed (for rhinitis).      ROS: History obtained from the patient  General ROS: negative for - chills, fatigue, fever, night sweats, weight gain or weight loss Psychological ROS: negative for - behavioral disorder, hallucinations, memory difficulties, mood swings or suicidal ideation Ophthalmic ROS: negative for - blurry vision, double vision, eye pain or loss of vision ENT ROS: negative for - epistaxis, nasal discharge, oral lesions, sore throat, tinnitus or vertigo Allergy and Immunology ROS: negative for - hives or itchy/watery eyes Hematological and Lymphatic ROS: negative for - bleeding problems, bruising or swollen lymph nodes Endocrine ROS: negative for - galactorrhea, hair pattern changes, polydipsia/polyuria or temperature intolerance Respiratory  ROS: negative for - cough, hemoptysis, shortness of breath or wheezing Cardiovascular ROS: negative for - chest pain, dyspnea on exertion, edema or irregular heartbeat Gastrointestinal ROS: negative for - abdominal pain, diarrhea, hematemesis, nausea/vomiting or stool incontinence Genito-Urinary ROS: negative for - dysuria, hematuria, incontinence or urinary frequency/urgency Musculoskeletal ROS: negative for - joint swelling or muscular weakness Neurological ROS: as noted in HPI Dermatological ROS: negative for rash and skin lesion changes  Physical Examination: Blood pressure  153/68, pulse 101, resp. rate 14, height 5' (1.524 m), weight 58.7 kg (129 lb 6.6 oz), SpO2 97 %.  HEENT-  Normocephalic, no lesions, without obvious abnormality.  Normal external eye and conjunctiva.  Normal TM's bilaterally.  Normal auditory canals and external ears. Normal external nose, mucus membranes and septum.  Normal pharynx. Neck supple with no masses, nodes, nodules or enlargement. Cardiovascular - regular rate and rhythm, S1, S2 normal, no murmur, click, rub or gallop Lungs - chest clear, no wheezing, rales, normal symmetric air entry Abdomen - soft, non-tender; bowel sounds normal; no masses,  no organomegaly Extremities - no joint deformities, effusion, or inflammation and no edema  Neurologic Examination: Mental Status: Alert, oriented, thought content appropriate.  Speech fluent without evidence of aphasia. Able to follow commands without difficulty. Cranial Nerves: II-Visual fields were normal. III/IV/VI-Pupils were equal and reacted. Extraocular movements were full and conjugate.    V/VII-mild left lower facial numbness; no facial weakness. VIII-normal. X-normal speech and symmetrical palatal movement. XI: trapezius strength/neck flexion strength normal bilaterally XII-midline tongue extension with normal strength. Motor: Mild drift of left upper extremity as well as reduced grip strength of left hand; motor exam otherwise unremarkable Sensory: Reduced sensation involving left hand, compared to the right.. Deep Tendon Reflexes: 1+ and symmetric. Plantars: Flexor bilaterally Cerebellar: Moderately impaired coordination of upper extremity. Carotid auscultation: Normal  Results for orders placed or performed during the hospital encounter of 10/22/15 (from the past 48 hour(s))  CBC     Status: None   Collection Time: 10/22/15  5:12 PM  Result Value Ref Range   WBC 7.4 3.6 - 11.0 K/uL   RBC 4.28 3.80 - 5.20 MIL/uL   Hemoglobin 13.0 12.0 - 16.0 g/dL   HCT 39.1 35.0 -  47.0 %   MCV 91.3 80.0 - 100.0 fL   MCH 30.3 26.0 - 34.0 pg   MCHC 33.2 32.0 - 36.0 g/dL   RDW 13.7 11.5 - 14.5 %   Platelets 201 150 - 440 K/uL  Differential     Status: None   Collection Time: 10/22/15  5:12 PM  Result Value Ref Range   Neutrophils Relative % 62 %   Neutro Abs 4.6 1.4 - 6.5 K/uL   Lymphocytes Relative 26 %   Lymphs Abs 2.0 1.0 - 3.6 K/uL   Monocytes Relative 11 %   Monocytes Absolute 0.8 0.2 - 0.9 K/uL   Eosinophils Relative 1 %   Eosinophils Absolute 0.1 0 - 0.7 K/uL   Basophils Relative 0 %   Basophils Absolute 0.0 0 - 0.1 K/uL  Protime-INR     Status: None   Collection Time: 10/22/15  5:12 PM  Result Value Ref Range   Prothrombin Time 14.2 11.4 - 15.0 seconds   INR 1.08   APTT     Status: None   Collection Time: 10/22/15  5:12 PM  Result Value Ref Range   aPTT 31 24 - 36 seconds  Comprehensive metabolic panel     Status: Abnormal   Collection  Time: 10/22/15  5:12 PM  Result Value Ref Range   Sodium 137 135 - 145 mmol/L   Potassium 3.5 3.5 - 5.1 mmol/L   Chloride 102 101 - 111 mmol/L   CO2 27 22 - 32 mmol/L   Glucose, Bld 141 (H) 65 - 99 mg/dL   BUN 20 6 - 20 mg/dL   Creatinine, Ser 0.72 0.44 - 1.00 mg/dL   Calcium 9.4 8.9 - 10.3 mg/dL   Total Protein 7.2 6.5 - 8.1 g/dL   Albumin 4.0 3.5 - 5.0 g/dL   AST 25 15 - 41 U/L   ALT 12 (L) 14 - 54 U/L   Alkaline Phosphatase 54 38 - 126 U/L   Total Bilirubin 0.4 0.3 - 1.2 mg/dL   GFR calc non Af Amer >60 >60 mL/min   GFR calc Af Amer >60 >60 mL/min    Comment: (NOTE) The eGFR has been calculated using the CKD EPI equation. This calculation has not been validated in all clinical situations. eGFR's persistently <60 mL/min signify possible Chronic Kidney Disease.    Anion gap 8 5 - 15   Ct Head Wo Contrast  10/22/2015  CLINICAL DATA:  Acute onset of left-sided weakness and slurred speech beginning 3 hours ago. EXAM: CT HEAD WITHOUT CONTRAST CT CERVICAL SPINE WITHOUT CONTRAST TECHNIQUE: Multidetector  CT imaging of the head and cervical spine was performed following the standard protocol without intravenous contrast. Multiplanar CT image reconstructions of the cervical spine were also generated. COMPARISON:  MRI of the cervical spine 02/06/2012. FINDINGS: CT HEAD FINDINGS An acute hemorrhagic infarct is present in the posterior limb right internal capsule and thalamus measuring 14 x 9 mm. No other acute hemorrhage is present. A calcified extra-axial mass adjacent of the right frontal lobe measures 15 mm, compatible with a meningioma. There is a remote lacunar infarct in the left lentiform nucleus. Mild periventricular white matter hypoattenuation is present. There is blood in the right lateral ventricle without hydrocephalus. Atherosclerotic calcifications are present in the cavernous internal carotid arteries bilaterally. The paranasal sinuses and mastoid air cells are clear. The calvarium is intact. No significant extracranial soft tissue lesions are present. Bilateral lens replacements are present. The globes and orbits are otherwise intact. CT CERVICAL SPINE FINDINGS The cervical spine is imaged from the skullbase through T1-2. Vertebral body heights alignment are maintained. There is chronic loss of disc height at each level from C3-4 through C7-T1. Facet hypertrophy is noted. Osseous foraminal narrowing is present on the left at C3-4. Mild to moderate foraminal narrowing is present bilaterally from C4-5 through C7-T1. No acute fracture or traumatic subluxation is evident. Atherosclerotic calcifications are present proximally in the great vessels. Atherosclerotic changes are also present at the carotid bifurcations bilaterally without definite stenosis. Scarring is present at the lung apices. The soft tissues are otherwise unremarkable. Calcifications within the posterior cervical musculature at C5 and C6 is likely related to prior trauma. IMPRESSION: 1. 14 x 9 mm acute hemorrhagic infarct involving the  posterior limb right internal capsule and right thalamus. This is likely a hypertensive hemorrhage. For 2. Extension of hemorrhage into the right lateral ventricle without hydrocephalus. 3. Remote nonhemorrhagic infarct of the left lentiform nucleus. 4. 15 mm right frontal meningioma. 5. Advanced degenerative changes in the cervical spine without evidence for acute fracture or traumatic subluxation. These results were called by telephone at the time of interpretation on 10/22/2015 at 3:54 pm to Dr. Lenise Arena , who verbally acknowledged these results. Electronically Signed  By: San Morelle M.D.   On: 10/22/2015 15:58   Ct Cervical Spine Wo Contrast  10/22/2015  CLINICAL DATA:  Acute onset of left-sided weakness and slurred speech beginning 3 hours ago. EXAM: CT HEAD WITHOUT CONTRAST CT CERVICAL SPINE WITHOUT CONTRAST TECHNIQUE: Multidetector CT imaging of the head and cervical spine was performed following the standard protocol without intravenous contrast. Multiplanar CT image reconstructions of the cervical spine were also generated. COMPARISON:  MRI of the cervical spine 02/06/2012. FINDINGS: CT HEAD FINDINGS An acute hemorrhagic infarct is present in the posterior limb right internal capsule and thalamus measuring 14 x 9 mm. No other acute hemorrhage is present. A calcified extra-axial mass adjacent of the right frontal lobe measures 15 mm, compatible with a meningioma. There is a remote lacunar infarct in the left lentiform nucleus. Mild periventricular white matter hypoattenuation is present. There is blood in the right lateral ventricle without hydrocephalus. Atherosclerotic calcifications are present in the cavernous internal carotid arteries bilaterally. The paranasal sinuses and mastoid air cells are clear. The calvarium is intact. No significant extracranial soft tissue lesions are present. Bilateral lens replacements are present. The globes and orbits are otherwise intact. CT  CERVICAL SPINE FINDINGS The cervical spine is imaged from the skullbase through T1-2. Vertebral body heights alignment are maintained. There is chronic loss of disc height at each level from C3-4 through C7-T1. Facet hypertrophy is noted. Osseous foraminal narrowing is present on the left at C3-4. Mild to moderate foraminal narrowing is present bilaterally from C4-5 through C7-T1. No acute fracture or traumatic subluxation is evident. Atherosclerotic calcifications are present proximally in the great vessels. Atherosclerotic changes are also present at the carotid bifurcations bilaterally without definite stenosis. Scarring is present at the lung apices. The soft tissues are otherwise unremarkable. Calcifications within the posterior cervical musculature at C5 and C6 is likely related to prior trauma. IMPRESSION: 1. 14 x 9 mm acute hemorrhagic infarct involving the posterior limb right internal capsule and right thalamus. This is likely a hypertensive hemorrhage. For 2. Extension of hemorrhage into the right lateral ventricle without hydrocephalus. 3. Remote nonhemorrhagic infarct of the left lentiform nucleus. 4. 15 mm right frontal meningioma. 5. Advanced degenerative changes in the cervical spine without evidence for acute fracture or traumatic subluxation. These results were called by telephone at the time of interpretation on 10/22/2015 at 3:54 pm to Dr. Lenise Arena , who verbally acknowledged these results. Electronically Signed   By: San Morelle M.D.   On: 10/22/2015 15:58    Assessment: 81 y.o. female with a history of hypertension presenting with acute right thalamic and internal capsular hemorrhage most likely hypertension-related  Stroke Risk Factors - hypertension  Plan: 1. HgbA1c, fasting lipid panel 2. MRI, MRA  of the brain without contrast 3. PT consult, OT consult, Speech consult 4. Echocardiogram 5. Carotid dopplers 6. Prophylactic therapy-None 7. Risk factor  modification 8. Telemetry monitoring  This patient is critically ill and at significant risk of neurological worsening or death, and care requires constant monitoring of vital signs, hemodynamics,respiratory and cardiac monitoring, neurological assessment, discussion with family, other specialists and medical decision making of high complexity. Total critical care time was 50 minutes.  C.R. Nicole Kindred, MD 10/22/2015, 7:18 PM

## 2015-10-22 NOTE — ED Notes (Signed)
Pt reports falling and hitting head; reports laying in floor for unknown amount of time until neighbor found her. Pt reports last time looking at clock was 1300 before falling. Pt with left sided weakness and slurred speech.

## 2015-10-22 NOTE — ED Notes (Signed)
MD at bedside explaining CT scan to patient and discussing treatment plan.

## 2015-10-23 ENCOUNTER — Inpatient Hospital Stay (HOSPITAL_COMMUNITY): Payer: PPO

## 2015-10-23 ENCOUNTER — Encounter (HOSPITAL_COMMUNITY): Payer: Self-pay | Admitting: General Practice

## 2015-10-23 MED ORDER — PANTOPRAZOLE SODIUM 40 MG PO TBEC
40.0000 mg | DELAYED_RELEASE_TABLET | Freq: Every day | ORAL | Status: DC
  Administered 2015-10-23 – 2015-10-26 (×4): 40 mg via ORAL
  Filled 2015-10-23 (×4): qty 1

## 2015-10-23 MED ORDER — METOPROLOL SUCCINATE ER 25 MG PO TB24
25.0000 mg | ORAL_TABLET | Freq: Two times a day (BID) | ORAL | Status: DC
  Administered 2015-10-23 – 2015-10-26 (×7): 25 mg via ORAL
  Filled 2015-10-23 (×9): qty 1

## 2015-10-23 MED ORDER — CALCIUM CARBONATE-VITAMIN D 500-200 MG-UNIT PO TABS
1.0000 | ORAL_TABLET | Freq: Two times a day (BID) | ORAL | Status: DC
  Administered 2015-10-23 – 2015-10-26 (×6): 1 via ORAL
  Filled 2015-10-23 (×9): qty 1

## 2015-10-23 MED ORDER — MAGNESIUM OXIDE 400 (241.3 MG) MG PO TABS
400.0000 mg | ORAL_TABLET | Freq: Every day | ORAL | Status: DC
  Administered 2015-10-23 – 2015-10-27 (×5): 400 mg via ORAL
  Filled 2015-10-23 (×5): qty 1

## 2015-10-23 MED ORDER — HYDROCHLOROTHIAZIDE 25 MG PO TABS
25.0000 mg | ORAL_TABLET | Freq: Every day | ORAL | Status: DC
  Administered 2015-10-23 – 2015-10-24 (×2): 25 mg via ORAL
  Filled 2015-10-23 (×2): qty 1

## 2015-10-23 MED ORDER — LORAZEPAM 1 MG PO TABS
1.0000 mg | ORAL_TABLET | Freq: Three times a day (TID) | ORAL | Status: DC | PRN
  Administered 2015-10-23 – 2015-10-27 (×8): 1 mg via ORAL
  Filled 2015-10-23 (×8): qty 1

## 2015-10-23 MED ORDER — ESTRADIOL 1 MG PO TABS
0.5000 mg | ORAL_TABLET | Freq: Every day | ORAL | Status: DC
  Administered 2015-10-23 – 2015-10-27 (×4): 0.5 mg via ORAL
  Filled 2015-10-23 (×6): qty 0.5

## 2015-10-23 NOTE — Evaluation (Signed)
Physical Therapy Evaluation Patient Details Name: Theresa Harris MRN: [id number] DOB: March 13, 1927 Today's Date: 10/23/2015   History of Present Illness  pt presents R Thalamic and Internal Capsule ICH.  pt with hx of L hip fx and THA, HTN, CHF, and Anxiety.    Clinical Impression  Pt very motivated to improve mobility to return to home.  Pt with L sided weakness, ataxic movement, and decreased proprioception requiring A for all aspects of mobility.  Feel pt will need CIR level of therapies at D/C to maximize independence.  Will continue to follow.      Follow Up Recommendations CIR    Equipment Recommendations  None recommended by PT    Recommendations for Other Services Rehab consult     Precautions / Restrictions Precautions Precautions: Fall Restrictions Weight Bearing Restrictions: No      Mobility  Bed Mobility               General bed mobility comments: pt sitting in recliner.    Transfers Overall transfer level: Needs assistance Equipment used: Rolling walker (2 wheeled) Transfers: Sit to/from Stand Sit to Stand: Min assist         General transfer comment: cues for UE use and controlling descent to sitting.    Ambulation/Gait Ambulation/Gait assistance: Mod assist Ambulation Distance (Feet): 10 Feet Assistive device: Rolling walker (2 wheeled) Gait Pattern/deviations: Step-through pattern;Decreased stance time - left;Ataxic;Trunk flexed     General Gait Details: pt with L lateral lean and drift.  pt with ataxic movements in L LE and pt needs max cueing for safe technique.  A with RW management as well.    Stairs            Wheelchair Mobility    Modified Rankin (Stroke Patients Only) Modified Rankin (Stroke Patients Only) Pre-Morbid Rankin Score: Slight disability Modified Rankin: Moderately severe disability     Balance Overall balance assessment: Needs assistance;History of Falls Sitting-balance support: No upper extremity  supported;Feet supported Sitting balance-Leahy Scale: Good     Standing balance support: Bilateral upper extremity supported;During functional activity Standing balance-Leahy Scale: Poor                               Pertinent Vitals/Pain Pain Assessment: No/denies pain    Home Living Family/patient expects to be discharged to:: Inpatient rehab                      Prior Function Level of Independence: Independent with assistive device(s)         Comments: pt uses single crutch for stability.       Hand Dominance        Extremity/Trunk Assessment   Upper Extremity Assessment: Defer to OT evaluation           Lower Extremity Assessment: LLE deficits/detail   LLE Deficits / Details: Strength grossly 4-/5, decreased proprioception, ataxic movement.    Cervical / Trunk Assessment: Kyphotic  Communication   Communication: No difficulties  Cognition Arousal/Alertness: Awake/alert Behavior During Therapy: WFL for tasks assessed/performed Overall Cognitive Status: Within Functional Limits for tasks assessed                      General Comments      Exercises        Assessment/Plan    PT Assessment Patient needs continued PT services  PT Diagnosis Difficulty walking;Hemiplegia non-dominant side  PT Problem List Decreased strength;Decreased activity tolerance;Decreased balance;Decreased mobility;Decreased coordination;Decreased knowledge of use of DME;Impaired sensation  PT Treatment Interventions DME instruction;Gait training;Stair training;Functional mobility training;Therapeutic activities;Therapeutic exercise;Balance training;Neuromuscular re-education;Patient/family education   PT Goals (Current goals can be found in the Care Plan section) Acute Rehab PT Goals Patient Stated Goal: Get home. PT Goal Formulation: With patient Time For Goal Achievement: 11/06/15 Potential to Achieve Goals: Good    Frequency Min 4X/week    Barriers to discharge        Co-evaluation               End of Session Equipment Utilized During Treatment: Gait belt Activity Tolerance: Patient tolerated treatment well Patient left: in chair;with call bell/phone within reach;with family/visitor present Nurse Communication: Mobility status         Time: 8101-7510 PT Time Calculation (min) (ACUTE ONLY): 33 min   Charges:   PT Evaluation $Initial PT Evaluation Tier I: 1 Procedure PT Treatments $Gait Training: 8-22 mins   PT G CodesCatarina Hartshorn, [place] 10/23/2015, 11:52 AM

## 2015-10-23 NOTE — Progress Notes (Signed)
Stroke Team Progress Note  HISTORY  81 y.o. female history of hypertension, congestive heart failure, spinal stenosis and anxiety, transferred from [facility] with an acute R subcortical hemorrhage.   ICH score of 1 Much improved today  SUBJECTIVE LUE numbness improved.     OBJECTIVE Most recent Vital Signs: Filed Vitals:   10/23/15 0600 10/23/15 0700 10/23/15 0739 10/23/15 0800  BP: 146/67 124/35  137/41  Pulse: 92 68  42  Temp:   98.1 F (36.7 C)   TempSrc:   Oral   Resp: '14 17  14  '$ Height:      Weight:      SpO2: 97% 94%  94%   CBG (last 3)  No results for input(s): GLUCAP in the last 72 hours.  IV Fluid Intake:   . sodium chloride 75 mL/hr at 10/22/15 2200    MEDICATIONS  .  stroke: mapping our early stages of recovery book   Does not apply Once  . pantoprazole (PROTONIX) IV  40 mg Intravenous QHS  . senna-docusate  1 tablet Oral BID   PRN:  acetaminophen **OR** acetaminophen, labetalol  Diet:  Diet Heart Room service appropriate?: Yes; Fluid consistency:: Thin Activity:  Up with assistance DVT Prophylaxis:  SCDs  CLINICALLY SIGNIFICANT STUDIES Basic Metabolic Panel:  Recent Labs Lab 10/22/15 1712  NA 137  K 3.5  CL 102  CO2 27  GLUCOSE 141*  BUN 20  CREATININE 0.72  CALCIUM 9.4   Liver Function Tests:  Recent Labs Lab 10/22/15 1712  AST 25  ALT 12*  ALKPHOS 54  BILITOT 0.4  PROT 7.2  ALBUMIN 4.0   CBC:  Recent Labs Lab 10/22/15 1712  WBC 7.4  NEUTROABS 4.6  HGB 13.0  HCT 39.1  MCV 91.3  PLT 201   Coagulation:  Recent Labs Lab 10/22/15 1712  LABPROT 14.2  INR 1.08   Cardiac Enzymes: No results for input(s): CKTOTAL, CKMB, CKMBINDEX, TROPONINI in the last 168 hours. Urinalysis: No results for input(s): COLORURINE, LABSPEC, PHURINE, GLUCOSEU, HGBUR, BILIRUBINUR, KETONESUR, PROTEINUR, UROBILINOGEN, NITRITE, LEUKOCYTESUR in the last 168 hours.  Invalid input(s): APPERANCEUR Lipid PanelNo results found for: CHOL, TRIG, HDL, CHOLHDL,  VLDL, LDLCALC HgbA1C No results found for: HGBA1C  Urine Drug Screen:  No results found for: LABOPIA, COCAINSCRNUR, LABBENZ, AMPHETMU, THCU, LABBARB  Alcohol Level: No results for input(s): ETH in the last 168 hours.  Ct Head Wo Contrast  10/22/2015  CLINICAL DATA:  Acute onset of left-sided weakness and slurred speech beginning 3 hours ago. EXAM: CT HEAD WITHOUT CONTRAST CT CERVICAL SPINE WITHOUT CONTRAST TECHNIQUE: Multidetector CT imaging of the head and cervical spine was performed following the standard protocol without intravenous contrast. Multiplanar CT image reconstructions of the cervical spine were also generated. COMPARISON:  MRI of the cervical spine 02/06/2012. FINDINGS: CT HEAD FINDINGS An acute hemorrhagic infarct is present in the posterior limb right internal capsule and thalamus measuring 14 x 9 mm. No other acute hemorrhage is present. A calcified extra-axial mass adjacent of the right frontal lobe measures 15 mm, compatible with a meningioma. There is a remote lacunar infarct in the left lentiform nucleus. Mild periventricular white matter hypoattenuation is present. There is blood in the right lateral ventricle without hydrocephalus. Atherosclerotic calcifications are present in the cavernous internal carotid arteries bilaterally. The paranasal sinuses and mastoid air cells are clear. The calvarium is intact. No significant extracranial soft tissue lesions are present. Bilateral lens replacements are present. The globes and orbits are otherwise intact. CT  CERVICAL SPINE FINDINGS The cervical spine is imaged from the skullbase through T1-2. Vertebral body heights alignment are maintained. There is chronic loss of disc height at each level from C3-4 through C7-T1. Facet hypertrophy is noted. Osseous foraminal narrowing is present on the left at C3-4. Mild to moderate foraminal narrowing is present bilaterally from C4-5 through C7-T1. No acute fracture or traumatic subluxation is evident.  Atherosclerotic calcifications are present proximally in the great vessels. Atherosclerotic changes are also present at the carotid bifurcations bilaterally without definite stenosis. Scarring is present at the lung apices. The soft tissues are otherwise unremarkable. Calcifications within the posterior cervical musculature at C5 and C6 is likely related to prior trauma. IMPRESSION: 1. 14 x 9 mm acute hemorrhagic infarct involving the posterior limb right internal capsule and right thalamus. This is likely a hypertensive hemorrhage. For 2. Extension of hemorrhage into the right lateral ventricle without hydrocephalus. 3. Remote nonhemorrhagic infarct of the left lentiform nucleus. 4. 15 mm right frontal meningioma. 5. Advanced degenerative changes in the cervical spine without evidence for acute fracture or traumatic subluxation. These results were called by telephone at the time of interpretation on 10/22/2015 at 3:54 pm to Dr. Lenise Arena , who verbally acknowledged these results. Electronically Signed   By: San Morelle M.D.   On: 10/22/2015 15:58   Ct Cervical Spine Wo Contrast  10/22/2015  CLINICAL DATA:  Acute onset of left-sided weakness and slurred speech beginning 3 hours ago. EXAM: CT HEAD WITHOUT CONTRAST CT CERVICAL SPINE WITHOUT CONTRAST TECHNIQUE: Multidetector CT imaging of the head and cervical spine was performed following the standard protocol without intravenous contrast. Multiplanar CT image reconstructions of the cervical spine were also generated. COMPARISON:  MRI of the cervical spine 02/06/2012. FINDINGS: CT HEAD FINDINGS An acute hemorrhagic infarct is present in the posterior limb right internal capsule and thalamus measuring 14 x 9 mm. No other acute hemorrhage is present. A calcified extra-axial mass adjacent of the right frontal lobe measures 15 mm, compatible with a meningioma. There is a remote lacunar infarct in the left lentiform nucleus. Mild periventricular white  matter hypoattenuation is present. There is blood in the right lateral ventricle without hydrocephalus. Atherosclerotic calcifications are present in the cavernous internal carotid arteries bilaterally. The paranasal sinuses and mastoid air cells are clear. The calvarium is intact. No significant extracranial soft tissue lesions are present. Bilateral lens replacements are present. The globes and orbits are otherwise intact. CT CERVICAL SPINE FINDINGS The cervical spine is imaged from the skullbase through T1-2. Vertebral body heights alignment are maintained. There is chronic loss of disc height at each level from C3-4 through C7-T1. Facet hypertrophy is noted. Osseous foraminal narrowing is present on the left at C3-4. Mild to moderate foraminal narrowing is present bilaterally from C4-5 through C7-T1. No acute fracture or traumatic subluxation is evident. Atherosclerotic calcifications are present proximally in the great vessels. Atherosclerotic changes are also present at the carotid bifurcations bilaterally without definite stenosis. Scarring is present at the lung apices. The soft tissues are otherwise unremarkable. Calcifications within the posterior cervical musculature at C5 and C6 is likely related to prior trauma. IMPRESSION: 1. 14 x 9 mm acute hemorrhagic infarct involving the posterior limb right internal capsule and right thalamus. This is likely a hypertensive hemorrhage. For 2. Extension of hemorrhage into the right lateral ventricle without hydrocephalus. 3. Remote nonhemorrhagic infarct of the left lentiform nucleus. 4. 15 mm right frontal meningioma. 5. Advanced degenerative changes in the cervical spine without  evidence for acute fracture or traumatic subluxation. These results were called by telephone at the time of interpretation on 10/22/2015 at 3:54 pm to Dr. Lenise Arena , who verbally acknowledged these results. Electronically Signed   By: San Morelle M.D.   On: 10/22/2015  15:58    CT of the brain  14 x 9 mm acute hemorrhagic infarct involving the posterior limb right internal capsule and right thalamus.   MRI of the brain    MRA of the brain    Carotid Doppler  Pending   2D Echocardiogram  Pending   CXR    EKG  sinus Therapy Recommendations pending  Physical Exam   Neurologic Examination: Mental Status: Alert, oriented, thought content appropriate. Speech fluent without evidence of aphasia. Able to follow commands without difficulty. Cranial Nerves: II-Visual fields were normal. III/IV/VI-Pupils were equal and reacted. Extraocular movements were full and conjugate.   V/VII-mild left lower facial numbness; no facial weakness. VIII-normal. X-normal speech and symmetrical palatal movement. XI: trapezius strength/neck flexion strength normal bilaterally XII-midline tongue extension with normal strength. Motor: Mild drift of left upper extremity as well as reduced grip strength of left hand; motor exam otherwise unremarkable Sensory: Reduced sensation involving left hand, compared to the right.. Deep Tendon Reflexes: 1+ and symmetric. Plantars: Flexor bilaterally Cerebellar: Moderately impaired coordination of upper extremity. Carotid auscultation: Normal  ASSESSMENT  81 y.o. female history of hypertension, congestive heart failure, spinal stenosis and anxiety, transferred from [facility] with an acute R subcortical hemorrhage.   ICH score of 1 Hospital day # 1  TREATMENT/PLAN - Con't SCDs, SQ heparin start tomorrow - No need for MRI as this is a hypertensive bleed - out of bed to chair - PT/OT - X ray R shoulder due to pain 2 view - labetalol/hydralazine PRN for SBP <160  SIGNED    To contact Stroke Continuity provider, please refer to [email or web address] After hours, contact General Neurology

## 2015-10-23 NOTE — Progress Notes (Signed)
Rehab Admissions Coordinator Note:  Patient was screened by Cleatrice Burke for appropriateness for an Inpatient Acute Rehab Consult per PT recommendation.  At this time, we are recommending an inpt rehab consult.  Cleatrice Burke 10/23/2015, 2:19 PM  I can be reached at [phone].

## 2015-10-23 NOTE — Progress Notes (Signed)
PT Cancellation Note  Patient Details Name: Theresa Harris MRN: [id number] DOB: 1927/01/30   Cancelled Treatment:    Reason Eval/Treat Not Completed: Patient not medically ready.  Pt currently with bedrest ordered.  Please advance activity order once appropriate for PT and mobility.     Camara Rosander, Thornton Papas 10/23/2015, 8:00 AM

## 2015-10-23 NOTE — Evaluation (Signed)
Speech Language Pathology Evaluation Patient Details Name: Theresa Harris MRN: [id number] DOB: 06-08-1927 Today's Date: 10/23/2015 Time: 6010-9323 SLP Time Calculation (min) (ACUTE ONLY): 30 min  Problem List:  Patient Active Problem List   Diagnosis Date Noted  . ICH (intracerebral hemorrhage) ([place]) 10/22/2015   Past Medical History:  Past Medical History  Diagnosis Date  . Spinal stenosis   . CHF (congestive heart failure) ([place])   . Anxiety   . HTN (hypertension)    Past Surgical History:  Past Surgical History  Procedure Laterality Date  . Knee surgery      right  . Abdominal hysterectomy    . Hip surgery    . Cholecystectomy    . Carpal tunnel release     HPI:  81 y.o. female history of hypertension, congestive heart failure, spinal stenosis and anxiety, transferred from [facility] with an acute R subcortical hemorrhage.    Assessment / Plan / Recommendation Clinical Impression  Patient presents with cognitive-linguisitic and speech functioning WFL-WNL. Patient did not exhibit any speech impairment, no concerns for safety awareness or problem solving, no impairment of memory, reasoning, orientation as per this evaluation.     SLP Assessment  Patient does not need any further Speech Lanaguage Pathology Services    Follow Up Recommendations  None    Frequency and Duration           SLP Evaluation Prior Functioning  Cognitive/Linguistic Baseline: Within functional limits   Cognition  Overall Cognitive Status: Within Functional Limits for tasks assessed Orientation Level: Oriented X4    Comprehension  Auditory Comprehension Overall Auditory Comprehension: Appears within functional limits for tasks assessed    Expression Expression Primary Mode of Expression: Verbal Verbal Expression Overall Verbal Expression: Appears within functional limits for tasks assessed   Oral / Motor Oral Motor/Sensory Function Overall Oral Motor/Sensory Function: Within functional  limits Motor Speech Overall Motor Speech: Appears within functional limits for tasks assessed    Sonia Baller, MA, CCC-SLP 10/23/2015 4:52 PM

## 2015-10-24 MED ORDER — ALUM & MAG HYDROXIDE-SIMETH 200-200-20 MG/5ML PO SUSP
30.0000 mL | ORAL | Status: DC | PRN
  Administered 2015-10-24: 30 mL via ORAL
  Filled 2015-10-24: qty 30

## 2015-10-24 MED ORDER — ONDANSETRON HCL 4 MG/2ML IJ SOLN
4.0000 mg | Freq: Four times a day (QID) | INTRAMUSCULAR | Status: DC | PRN
  Administered 2015-10-24: 4 mg via INTRAVENOUS

## 2015-10-24 MED ORDER — HYDROCHLOROTHIAZIDE 12.5 MG PO CAPS
12.5000 mg | ORAL_CAPSULE | Freq: Every day | ORAL | Status: DC
  Administered 2015-10-26: 12.5 mg via ORAL
  Filled 2015-10-24 (×3): qty 1

## 2015-10-24 NOTE — Progress Notes (Signed)
STROKE TEAM PROGRESS NOTE   HISTORY Theresa Harris is an 81 y.o. female history of hypertension, congestive heart failure, spinal stenosis and anxiety, transferred from [facility] with an acute hemorrhage or parenchymal right cerebral hemorrhage. Patient was last known well at about 1300 today. She was standing on a step stool at the time of symptom onset and fell to her left side. CT scan of her head showed a small to parenchymal hemorrhage involving right thalamus and internal capsule. There was no significant mass effect. NIH stroke score was 3. Patient has no previous history of stroke or TIA.  LSN: 1 PM on 10/22/2015 tPA Given: No: Acute ICH mRankin:    SUBJECTIVE (INTERVAL HISTORY) Her daughter  is at the bedside.  Overall she feels her condition is improved. She had some nausea this morning which improved. No headache, no dizziness, no new focal neurologic symptoms. Numbness in the face is improved.    OBJECTIVE Temp:  [97.3 F (36.3 C)-98.9 F (37.2 C)] 97.3 F (36.3 C) (11/27 0800) Pulse Rate:  [43-87] 72 (11/27 0800) Cardiac Rhythm:  [-] Heart block (11/27 0700) Resp:  [13-21] 16 (11/27 0800) BP: (109-182)/(24-157) 161/66 mmHg (11/27 0800) SpO2:  [93 %-99 %] 93 % (11/27 0800)  CBC:  Recent Labs Lab 10/22/15 1712  WBC 7.4  NEUTROABS 4.6  HGB 13.0  HCT 39.1  MCV 91.3  PLT 505    Basic Metabolic Panel:  Recent Labs Lab 10/22/15 1712  NA 137  K 3.5  CL 102  CO2 27  GLUCOSE 141*  BUN 20  CREATININE 0.72  CALCIUM 9.4    Lipid Panel: No results found for: CHOL, TRIG, HDL, CHOLHDL, VLDL, LDLCALC HgbA1c: No results found for: HGBA1C Urine Drug Screen: No results found for: LABOPIA, COCAINSCRNUR, LABBENZ, AMPHETMU, THCU, LABBARB    IMAGING  Dg Shoulder Right 10/23/2015   1. No fracture or dislocation in the right shoulder.  2. Mild right AC joint and moderate inferior right glenohumeral joint osteoarthritis.    Ct Head Wo Contrast 10/22/2015   1. 14 x 9  mm acute hemorrhagic infarct involving the posterior limb right internal capsule and right thalamus. This is likely a hypertensive hemorrhage. 2. Extension of hemorrhage into the right lateral ventricle without hydrocephalus.  3. Remote nonhemorrhagic infarct of the left lentiform nucleus.  4. 15 mm right frontal meningioma.  5. Advanced degenerative changes in the cervical spine without evidence for acute fracture or traumatic subluxation.    Ct Cervical Spine Wo Contrast 10/22/2015    Advanced degenerative changes in the cervical spine without evidence for acute fracture or traumatic subluxation.   PHYSICAL EXAM CV: RRR, no carotid bruits. No m/r/g General: NAD  Neurologic Examination: Mental Status: Alert, oriented, thought content appropriate.Alert, oriented to name, date, location, situation. Follows complex commands. Speech fluent without evidence of aphasia, no dysarthria. Able to follow commands without difficulty. Cranial Nerves: II-Visual fields were normal. III/IV/VI-Pupils were equal and reacted. Extraocular movements were full and conjugate.  V/VII-no facial weakness, facial numbness of the left side improved with some mild residual sensory changes. VIII-normal. X-normal speech and symmetrical palatal movement. XI: trapezius strength/neck flexion strength normal bilaterally XII-midline tongue extension with normal strength. Motor: Mild drift of left upper extremity as well as reduced grip strength of left hand with mildly reduced fine motor of the left hand; left leg 4/5. motor exam otherwise unremarkable Sensory: Reduced sensation involving left hand, compared to the right. Deep Tendon Reflexes: 1+ and symmetric. Plantars: Flexor bilaterally  Carotid auscultation: Normal    ASSESSMENT/PLAN Ms. SHERINA Harris is a 81 y.o. female with history of hypertension, congestive heart failure, spinal stenosis, and anxiety presenting with numbness and weakness involving the  left side of the face and left upper extremity as well as coordination difficulties of the left upper extremity . She did not receive IV t-PA due to acute intracranial hemorrhage.  Stroke:  Non-dominant hemorrhagic infarct secondary to hypertension.  Resultant  Mild left face and hand numbness and very minimal left-sided weakness. (Patient has some chronic left-sided leg weakness due to hip and knee surgeries.)  MRI  not performed  MRA  not performed  CT - 14 x 9 mm acute hemorrhagic infarct involving the posterior limb right internal capsule and right thalamus.  Carotid Doppler not indicated  2D Echo - pending  LDL - not indicated  HgbA1c - pending  VTE prophylaxis - SCDs  Diet Heart Room service appropriate?: Yes; Fluid consistency:: Thin  No antithrombotic prior to admission, now on No antithrombotic secondary to hemorrhage.  Ongoing aggressive stroke risk factor management  Therapy recommendations: Pending  Disposition: Pending  Hypertension  Stable   Other Stroke Risk Factors  Advanced age  Cigarette smoker, quit smoking    Other Active Problems  Elevated glucose - check hemoglobin A1c   TREATMENT/PLAN - Con't SCDs, SQ heparin start tomorrow - No need for MRI as this is a hypertensive bleed - out of bed to chair - PT/OT - X ray R shoulder due to pain 2 view - no fracture - labetalol/hydralazine PRN for SBP <160     Hospital day # 2  Personally examined patient and images, personally conducted neuro exam,documented assessment and plan as stated above.  I have personally obtained the history, evaluated lab data, reviewed imaging studies and agree with radiology interpretations.     Sarina Ill, MD Stroke Neurology [phone] [facility]    To contact Stroke Continuity provider, please refer to [email or web address] After hours, contact General Neurology

## 2015-10-24 NOTE — Progress Notes (Signed)
Pt transferred from [facility] with diagnosis of hemorhagic stroke, alert and oriented, pt settled in bed with call light within pt's reach, family (son Hoover Browns) called and notified of transfer, will however continue to monitor. Obasogie-Asidi, Amandeep Hogston Efe

## 2015-10-25 ENCOUNTER — Inpatient Hospital Stay (HOSPITAL_COMMUNITY): Payer: PPO

## 2015-10-25 ENCOUNTER — Other Ambulatory Visit (HOSPITAL_COMMUNITY): Payer: PPO

## 2015-10-25 DIAGNOSIS — I509 Heart failure, unspecified: Secondary | ICD-10-CM

## 2015-10-25 DIAGNOSIS — I10 Essential (primary) hypertension: Secondary | ICD-10-CM

## 2015-10-25 DIAGNOSIS — I6789 Other cerebrovascular disease: Secondary | ICD-10-CM

## 2015-10-25 DIAGNOSIS — Z96642 Presence of left artificial hip joint: Secondary | ICD-10-CM

## 2015-10-25 DIAGNOSIS — F411 Generalized anxiety disorder: Secondary | ICD-10-CM

## 2015-10-25 DIAGNOSIS — I610 Nontraumatic intracerebral hemorrhage in hemisphere, subcortical: Principal | ICD-10-CM

## 2015-10-25 DIAGNOSIS — M4806 Spinal stenosis, lumbar region: Secondary | ICD-10-CM

## 2015-10-25 MED ORDER — LOPERAMIDE HCL 2 MG PO CAPS: 2.0000 mg | ORAL_CAPSULE | ORAL | Status: DC | PRN

## 2015-10-25 MED ORDER — HYDROCORTISONE 1 % EX CREA
TOPICAL_CREAM | Freq: Three times a day (TID) | CUTANEOUS | Status: DC
  Administered 2015-10-25: 22:00:00 via TOPICAL
  Administered 2015-10-26: 1 via TOPICAL
  Administered 2015-10-26 – 2015-10-27 (×3): via TOPICAL
  Filled 2015-10-25 (×2): qty 28

## 2015-10-25 MED ORDER — LOPERAMIDE HCL 2 MG PO CAPS
2.0000 mg | ORAL_CAPSULE | Freq: Once | ORAL | Status: AC
  Administered 2015-10-25: 2 mg via ORAL
  Filled 2015-10-25: qty 1

## 2015-10-25 NOTE — Progress Notes (Signed)
[facility] STROKE TEAM PROGRESS NOTE   HISTORY Theresa Harris is an 81 y.o. female history of hypertension, congestive heart failure, spinal stenosis and anxiety, transferred from [facility] with an acute hemorrhage or parenchymal right cerebral hemorrhage. Patient was last known well at about 1300 today 10/22/2015. She was standing on a step stool at the time of symptom onset and fell to her left side. CT scan of her head showed a small to parenchymal hemorrhage involving right thalamus and internal capsule. There was no significant mass effect. NIH stroke score was 3. Patient has no previous history of stroke or TIA.   SUBJECTIVE (INTERVAL HISTORY) Complains of diarrhea. Agreeable to MRI. Neurologically stable. Blood pressure adequately controlled.   OBJECTIVE Temp:  [97 F (36.1 C)-100.1 F (37.8 C)] 98.3 F (36.8 C) (11/28 0934) Pulse Rate:  [35-83] 79 (11/28 0934) Cardiac Rhythm:  [-] Heart block (11/27 2000) Resp:  [14-32] 16 (11/28 0934) BP: (110-149)/(33-51) 145/40 mmHg (11/28 0934) SpO2:  [95 %-98 %] 98 % (11/28 0934)  CBC:   Recent Labs Lab 10/22/15 1712  WBC 7.4  NEUTROABS 4.6  HGB 13.0  HCT 39.1  MCV 91.3  PLT 710    Basic Metabolic Panel:   Recent Labs Lab 10/22/15 1712  NA 137  K 3.5  CL 102  CO2 27  GLUCOSE 141*  BUN 20  CREATININE 0.72  CALCIUM 9.4    Lipid Panel: No results found for: CHOL, TRIG, HDL, CHOLHDL, VLDL, LDLCALC HgbA1c: No results found for: HGBA1C Urine Drug Screen: No results found for: LABOPIA, COCAINSCRNUR, LABBENZ, AMPHETMU, THCU, LABBARB    IMAGING  Dg Shoulder Right 10/23/2015   1. No fracture or dislocation in the right shoulder.  2. Mild right AC joint and moderate inferior right glenohumeral joint osteoarthritis.   Ct Head Wo Contrast 10/22/2015   1. 14 x 9 mm acute hemorrhagic infarct involving the posterior limb right internal capsule and right thalamus. This is likely a hypertensive hemorrhage. 2. Extension of hemorrhage  into the right lateral ventricle without hydrocephalus.  3. Remote nonhemorrhagic infarct of the left lentiform nucleus.  4. 15 mm right frontal meningioma.  5. Advanced degenerative changes in the cervical spine without evidence for acute fracture or traumatic subluxation.   Ct Cervical Spine Wo Contrast 10/22/2015    Advanced degenerative changes in the cervical spine without evidence for acute fracture or traumatic subluxation.    PHYSICAL EXAM CV: RRR, no carotid bruits. No m/r/g General: NAD Neurologic Examination: Mental Status: Alert, oriented, thought content appropriate.Alert, oriented to name, date, location, situation. Follows complex commands. Speech fluent without evidence of aphasia, no dysarthria. Able to follow commands without difficulty. Cranial Nerves: II-Visual fields were normal. III/IV/VI-Pupils were equal and reacted. Extraocular movements were full and conjugate.  V/VII-no facial weakness, facial numbness of the left side improved with some mild residual sensory changes. VIII-normal. X-normal speech and symmetrical palatal movement. XI: trapezius strength/neck flexion strength normal bilaterally XII-midline tongue extension with normal strength. Motor: Mild drift of left upper extremity as well as reduced grip strength of left hand with mildly reduced fine motor of the left hand; left leg 4/5. motor exam otherwise unremarkable Sensory: Reduced sensation involving left hand, compared to the right. Deep Tendon Reflexes: 1+ and symmetric. Plantars: Flexor bilaterally Carotid auscultation: Normal    ASSESSMENT/PLAN Ms. Theresa Harris is a 81 y.o. female with history of hypertension, congestive heart failure, spinal stenosis, and anxiety presenting with numbness and weakness involving the left side of the  face and left upper extremity as well as coordination difficulties of the left upper extremity . She did not receive IV t-PA due to acute intracranial  hemorrhage.  Stroke:  Non-dominant R thalamic/PLIC hemorrhage vs hemorrhagic infarct, unclear etiology. Hemorrhage is not consistent with trauma.  Resultant  Mild left face and hand numbness and very minimal left-sided weakness. (Patient has some chronic left-sided leg weakness due to hip and knee surgeries.)  CT - 14 x 9 mm acute hemorrhagic infarct involving the posterior limb right internal capsule and right thalamus.  Check MRI / MRA to look for source of hemorrhage  Carotid Doppler ordered  2D Echo - canceled with bubble, ordered regular echo  HgbA1c - pending   VTE prophylaxis - SCDs Diet regular Room service appropriate?: Yes; Fluid consistency:: Thin  No antithrombotic prior to admission, now on No antithrombotic secondary to hemorrhage.  Ongoing aggressive stroke risk factor management  Therapy recommendations:  No ST, CIR. Consult in place  Disposition pending.   Hypertension  BP 188/50 on arrival  Stable  labetalol/hydralazine PRN for SBP <160  Other Stroke Risk Factors  Advanced age  Cigarette smoker, quit smoking   Other Active Problems  Elevated glucose - check hemoglobin A1c  R shoulder pain  X ray R shoulder - no fracture, dislocation  Loose stools  X 3 this am  Present at home, PTA since cholecystectomy  Discontinued bid senokot   imodium prn, preparation H  Incidental R frontal meningioma  No intervention recommended  Hospital day # [place] for Pager information 10/25/2015 11:34 AM    I have personally examined this patient, reviewed notes, independently viewed imaging studies, participated in medical decision making and plan of care. I have made any additions or clarifications directly to the above note. Agree with note above.  Plan check MRI scan of the brain with MRA. Mobilize out of bed. Therapy consults.  Antony Contras, MD Medical Director [facility] Pager:  [phone] 10/25/2015 4:05 PM  To contact Stroke Continuity provider, please refer to [email or web address] After hours, contact General Neurology

## 2015-10-25 NOTE — Progress Notes (Signed)
Physical Therapy Treatment Patient Details Name: Theresa Harris MRN: [id number] DOB: Oct 15, 1927 Today's Date: 10/25/2015    History of Present Illness pt presents R Thalamic and Internal Capsule ICH.  pt with hx of L hip fx and THA, HTN, CHF, and Anxiety.      PT Comments    Pt progressing towards physical therapy goals. Was able to ambulate around the bed to the recliner - about 15 feet. Pt continues to demonstrate increased L lateral lean, and required assist to maintain 4 legs of walker on floor. In standing, pt able to achieve L-side SLS with BUE support, and assist for terminal knee extension/hip extension, as part of therapeutic exercise. Will continue to follow.   Follow Up Recommendations  CIR     Equipment Recommendations  None recommended by PT    Recommendations for Other Services Rehab consult     Precautions / Restrictions Precautions Precautions: Fall Restrictions Weight Bearing Restrictions: No    Mobility  Bed Mobility Overal bed mobility: Needs Assistance Bed Mobility: Supine to Sit     Supine to sit: Min assist     General bed mobility comments: Pt able to transition to EOB with heavy use of bed rails, and min assist for trunk support as she elevated shoulders to full sitting position. Pt swaying in sitting initially, requiring assist for safety, and was able to scoot out to EOB so feet were supported, with extra time.   Transfers Overall transfer level: Needs assistance Equipment used: Rolling walker (2 wheeled) Transfers: Sit to/from Stand Sit to Stand: Min assist         General transfer comment: VC's for hand placement on seated surface for safety. Pt was able to power-up to full standing with assist for balance/support initially.   Ambulation/Gait Ambulation/Gait assistance: Min assist;+2 physical assistance;+2 safety/equipment Ambulation Distance (Feet): 15 Feet Assistive device: Rolling walker (2 wheeled) Gait Pattern/deviations:  Step-through pattern;Decreased stride length;Trunk flexed;Ataxic;Decreased weight shift to left;Decreased stance time - left Gait velocity: Decreased Gait velocity interpretation: Below normal speed for age/gender General Gait Details: Pt with L lateral lean and drift. Assist to keep R side of walker on ground as well as to manage walker direction during turns, and frequent cues required for safe technique.    Stairs            Wheelchair Mobility    Modified Rankin (Stroke Patients Only) Modified Rankin (Stroke Patients Only) Pre-Morbid Rankin Score: Slight disability Modified Rankin: Moderately severe disability     Balance Overall balance assessment: Needs assistance;History of Falls Sitting-balance support: Feet supported;No upper extremity supported Sitting balance-Leahy Scale: Fair Sitting balance - Comments: Occasional assist required to maintain sitting balance initially   Standing balance support: Bilateral upper extremity supported;During functional activity Standing balance-Leahy Scale: Poor                      Cognition Arousal/Alertness: Awake/alert Behavior During Therapy: WFL for tasks assessed/performed Overall Cognitive Status: Within Functional Limits for tasks assessed                      Exercises Other Exercises Other Exercises: In standing with BUE support, pt able to stand on RLE with cues for increased R lateral lean. SLS on L required assist for terminal knee extension, and support to avoid hip flexion. x5 on RLE, x3 on LLE    General Comments        Pertinent Vitals/Pain Pain Assessment: Faces Faces Pain  Scale: Hurts little more Pain Location: Burning from recent bouts of diarrhea.  Pain Intervention(s): Monitored during session;Repositioned    Home Living                      Prior Function            PT Goals (current goals can now be found in the care plan section) Acute Rehab PT Goals Patient Stated  Goal: Get home. PT Goal Formulation: With patient Time For Goal Achievement: 11/06/15 Potential to Achieve Goals: Good Progress towards PT goals: Progressing toward goals    Frequency  Min 4X/week    PT Plan Current plan remains appropriate    Co-evaluation             End of Session Equipment Utilized During Treatment: Gait belt Activity Tolerance: Patient tolerated treatment well Patient left: in chair;with call bell/phone within reach;with chair alarm set     Time: 4996-9249 PT Time Calculation (min) (ACUTE ONLY): 19 min  Charges:  $Gait Training: 8-22 mins                    G Codes:      Rolinda Roan October 29, 2015, 9:09 AM   Rolinda Roan, PT, DPT Acute Rehabilitation Services Pager: [phone]

## 2015-10-25 NOTE — Care Management Note (Signed)
Case Management Note  Patient Details  Name: Theresa Harris MRN: [id number] Date of Birth: Sep 14, 1927  Subjective/Objective:                    Action/Plan: Patient admitted with [facility]. Patient is from home with family. PT recommending CIR. CM will continue to follow for discharge planning.   Expected Discharge Date:                  Expected Discharge Plan:  [place]  In-House Referral:     Discharge planning Services     Post Acute Care Choice:    Choice offered to:     DME Arranged:    DME Agency:     HH Arranged:    [facility] Agency:     Status of Service:  In process, will continue to follow  Medicare Important Message Given:  Yes Date Medicare IM Given:    Medicare IM give by:    Date Additional Medicare IM Given:    Additional Medicare Important Message give by:     If discussed at [facility] of Stay Meetings, dates discussed:    Additional Comments:  Pollie Friar, RN 10/25/2015, 4:15 PM

## 2015-10-25 NOTE — Care Management Important Message (Signed)
Important Message  Patient Details  Name: Theresa Harris MRN: [id number] Date of Birth: 12/29/1926   Medicare Important Message Given:  Yes    Brandi Tomlinson P Gillespie 10/25/2015, 1:58 PM

## 2015-10-25 NOTE — Evaluation (Addendum)
Occupational Therapy Evaluation Patient Details Name: Theresa Harris MRN: [id number] DOB: May 08, 1927 Today's Date: 10/25/2015    History of Present Illness pt presents R Thalamic and Internal Capsule ICH.  pt with hx of L hip fx and THA, HTN, CHF, and Anxiety.     Clinical Impression   Patient presenting with decreased ADL and functional mobility independence secondary to above. Patient independent to mod I PTA. Patient currently functioning at an overall min to mod assist level. Patient will benefit from acute OT to increase overall independence in the areas of ADLs, functional mobility, and overall safety in order to safely discharge to venue listed below.     Follow Up Recommendations  CIR;Supervision/Assistance - 24 hour    Equipment Recommendations  Other (comment) (TBD)    Recommendations for Other Services  None at this time   Precautions / Restrictions Precautions Precautions: Fall Restrictions Weight Bearing Restrictions: No    Mobility Bed Mobility - Per PT note Overal bed mobility: Needs Assistance Bed Mobility: Supine to Sit     Supine to sit: Min assist     General bed mobility comments: Pt able to transition to EOB with heavy use of bed rails, and min assist for trunk support as she elevated shoulders to full sitting position. Pt swaying in sitting initially, requiring assist for safety, and was able to scoot out to EOB so feet were supported, with extra time.   Transfers Overall transfer level: Needs assistance Equipment used: None Transfers: Sit to/from [facility] Sit to Stand: Min assist Stand pivot transfers: Min assist       General transfer comment: No AD used, pt performed sit to/from stand and stand pivot transfer from [facility] to recliner    Balance Overall balance assessment: Needs assistance Sitting-balance support: No upper extremity supported;Feet supported Sitting balance-Leahy Scale: Fair Sitting balance - Comments:  Occasional assist required to maintain sitting balance initially Postural control: Left lateral lean Standing balance support: No upper extremity supported;During functional activity Standing balance-Leahy Scale: Poor    ADL Overall ADL's : Needs assistance/impaired Eating/Feeding: Set up;Sitting   Grooming: Set up;Sitting   Upper Body Bathing: Minimal assitance;Sitting   Lower Body Bathing: Moderate assistance;Sit to/from stand   Upper Body Dressing : Minimal assistance;Sitting   Lower Body Dressing: Moderate assistance;Sit to/from stand   Toilet Transfer: Moderate assistance;Stand-pivot;BSC   Toileting- Clothing Manipulation and Hygiene: Minimal assistance;Sitting/lateral lean       Functional mobility during ADLs: Moderate assistance General ADL Comments: Pt with left lateral lean with dynamic sitting and standing activities.     Vision Vision Assessment?: No apparent visual deficits          Pertinent Vitals/Pain Pain Assessment: Faces Faces Pain Scale: Hurts little more Pain Location: "rectum" from recent bouts of diarrhea Pain Descriptors / Indicators: Burning Pain Intervention(s): Monitored during session     Hand Dominance Right   Extremity/Trunk Assessment Upper Extremity Assessment Upper Extremity Assessment: LUE deficits/detail;RUE deficits/detail RUE Deficits / Details: h/o frozen shoulder LUE Deficits / Details: decreased shoulder strength (3/5), elbow to distally WFL; pt with complaints of tingling sensation in left hand; decreased coordination    Lower Extremity Assessment Lower Extremity Assessment: Defer to PT evaluation   Cervical / Trunk Assessment Cervical / Trunk Assessment: Kyphotic   Communication Communication Communication: No difficulties   Cognition Arousal/Alertness: Awake/alert Behavior During Therapy: WFL for tasks assessed/performed Overall Cognitive Status: Within Functional Limits for tasks assessed  Home  Living Family/patient expects to be discharged to:: Inpatient rehab Living Arrangements: Alone Available Help at Discharge:  (none, "unless I hire someone.Marland KitchenMarland KitchenI have long term health insurance") Type of Home: House Home Access: Ramped entrance     Home Layout: One level     Bathroom Shower/Tub: Tub/shower unit;Curtain   Bathroom Toilet: Handicapped height     Home Equipment: Hand held shower head;Tub bench;Bedside commode;Walker - 2 wheels;Cane - single point;Crutches   Prior Functioning/Environment Level of Independence: Independent with assistive device(s)  Comments: Pt uses RW in house, pt uses single crutch for stability in community.      OT Diagnosis: Generalized weakness;Acute pain;Hemiplegia non-dominant side   OT Problem List: Decreased strength;Decreased range of motion;Decreased activity tolerance;Impaired balance (sitting and/or standing);Decreased coordination;Decreased safety awareness;Decreased knowledge of use of DME or AE;Decreased knowledge of precautions;Pain   OT Treatment/Interventions: Self-care/ADL training;Therapeutic exercise;Neuromuscular education;Energy conservation;DME and/or AE instruction;Therapeutic activities;Patient/family education;Balance training    OT Goals(Current goals can be found in the care plan section) Acute Rehab OT Goals Patient Stated Goal: go to rehab OT Goal Formulation: With patient Time For Goal Achievement: 11/08/15 Potential to Achieve Goals: Good ADL Goals Pt Will Perform Grooming: with set-up;standing Pt Will Perform Lower Body Bathing: with set-up;sit to/from stand Pt Will Perform Lower Body Dressing: with set-up;sit to/from stand Pt Will Transfer to Toilet: with supervision;ambulating;bedside commode Pt Will Perform Tub/Shower Transfer: Tub transfer;tub bench;rolling walker;ambulating;with supervision Additional ADL Goal #1: Pt will be supervision using RW for functional mobility   OT Frequency: Min 2X/week   Barriers to  D/C: Decreased caregiver support   End of Session Activity Tolerance: Patient tolerated treatment well Patient left: in chair;with call bell/phone within reach;with chair alarm set   Time: 0928-1000 OT Time Calculation (min): 32 min Charges:  OT General Charges $OT Visit: 1 Procedure OT Evaluation $Initial OT Evaluation Tier I: 1 Procedure OT Treatments $Self Care/Home Management : 8-22 mins  Brennon Otterness , MS, OTR/L, CLT Pager: [phone]  10/25/2015, 10:15 AM

## 2015-10-25 NOTE — Progress Notes (Signed)
Echocardiogram 2D Echocardiogram has been performed.  Theresa Harris 10/25/2015, 12:48 PM

## 2015-10-25 NOTE — Consult Note (Addendum)
Physical Medicine and Rehabilitation Consult Reason for Consult: Traumatic ICH Referring Physician: Dr.Xu   HPI: Theresa Harris is a 81 y.o. right handed female with history of hypertension, systolic congestive heart failure, spinal stenosis. Patient lives alone in [place]  Name 1 story home and used a crutch and walker prior to admission due to history of multiple left hip surgeries. One level home with ramp. Presented to [facility] 10/22/2015 after a fall from a step stool landing on her left side. Blood pressure 188/45. CT of the head showed a small parenchymal hemorrhage involving right thalamus internal capsule measuring 14 x 9 mm with extension of hemorrhage into the right lateral ventricle without hydrocephalus. Incidental finding of 15 mm right frontal meningioma. She was transferred to [facility] for further evaluation. Patient did not receive TPA secondary to [place]. Echocardiogram is pending. Neurology consulted with the advise of close monitoring of blood pressure. Tolerating a regular consistency diet. Physical therapy evaluation completed 10/23/2015 with recommendations of physical medicine rehabilitation consult.  Review of Systems  Constitutional: Negative for fever and chills.  HENT: Negative for hearing loss.        Occasional headaches  Eyes: Negative for blurred vision and double vision.  Respiratory: Negative for cough.        Occasional shortness of breath with exertion  Cardiovascular: Positive for leg swelling. Negative for chest pain and palpitations.  Gastrointestinal: Positive for diarrhea. Negative for nausea and vomiting.  Genitourinary: Negative for dysuria and hematuria.  Musculoskeletal: Positive for myalgias, back pain, joint pain and falls.  Skin: Negative for rash.  Neurological: Positive for dizziness and tingling. Negative for seizures and weakness.       Spinal stenosis  Psychiatric/Behavioral:   Anxiety  All other systems reviewed and are negative.  Past Medical History  Diagnosis Date  . Spinal stenosis   . CHF (congestive heart failure) ([place])   . Anxiety   . HTN (hypertension)    Past Surgical History  Procedure Laterality Date  . Knee surgery      right  . Abdominal hysterectomy    . Hip surgery    . Cholecystectomy    . Carpal tunnel release     History reviewed. No pertinent family history. Social History:  reports that she has quit smoking. She does not have any smokeless tobacco history on file. She reports that she does not drink alcohol or use illicit drugs. Allergies:  Allergies  Allergen Reactions  . Morphine And Related Nausea And Vomiting   Medications Prior to Admission  Medication Sig Dispense Refill  . acetaminophen (TYLENOL) 500 MG tablet Take 500 mg by mouth every 6 (six) hours as needed for mild pain.    Marland Kitchen b complex vitamins tablet Take 1 tablet by mouth daily.    . Calcium Carbonate-Vitamin D (CALCIUM 600+D) 600-400 MG-UNIT tablet Take 1 tablet by mouth 2 (two) times daily.    Marland Kitchen estradiol (ESTRACE) 1 MG tablet Take 0.5 mg by mouth daily.     . hydrochlorothiazide (HYDRODIURIL) 25 MG tablet Take 25 mg by mouth daily.    . magnesium oxide (MAG-OX) 400 MG tablet Take 400 mg by mouth daily.    . metoprolol succinate (TOPROL-XL) 25 MG 24 hr tablet Take 25 mg by mouth 2 (two) times daily.    Marland Kitchen omeprazole (PRILOSEC) 20 MG capsule Take 20 mg by mouth 2 (two) times daily.    Marland Kitchen oxazepam (SERAX) 15 MG  capsule Take 15 mg by mouth 3 (three) times daily as needed for sleep.    Marland Kitchen tiZANidine (ZANAFLEX) 2 MG tablet Take 2 mg by mouth every 8 (eight) hours as needed for muscle spasms.    Marland Kitchen triamcinolone (NASACORT ALLERGY 24HR) 55 MCG/ACT AERO nasal inhaler Place 2 sprays into the nose daily as needed (for rhinitis).      Home: Home Living Family/patient expects to be discharged to:: Inpatient rehab Living Arrangements: Other relatives  Functional  History: Prior Function Level of Independence: Independent with assistive device(s) Comments: pt uses single crutch for stability.   Functional Status:  Mobility: Bed Mobility General bed mobility comments: pt sitting in recliner.   Transfers Overall transfer level: Needs assistance Equipment used: Rolling walker (2 wheeled) Transfers: Sit to/from Stand Sit to Stand: Min assist General transfer comment: cues for UE use and controlling descent to sitting.   Ambulation/Gait Ambulation/Gait assistance: Mod assist Ambulation Distance (Feet): 10 Feet Assistive device: Rolling walker (2 wheeled) Gait Pattern/deviations: Step-through pattern, Decreased stance time - left, Ataxic, Trunk flexed General Gait Details: pt with L lateral lean and drift.  pt with ataxic movements in L LE and pt needs max cueing for safe technique.  A with RW management as well.      ADL:    Cognition: Cognition Overall Cognitive Status: Within Functional Limits for tasks assessed Orientation Level: Oriented X4 Cognition Arousal/Alertness: Awake/alert Behavior During Therapy: WFL for tasks assessed/performed Overall Cognitive Status: Within Functional Limits for tasks assessed  Blood pressure 144/33, pulse 57, temperature 99.6 F (37.6 C), temperature source Oral, resp. rate 16, height 5' (1.524 m), weight 58.7 kg (129 lb 6.6 oz), SpO2 98 %. Physical Exam  Vitals reviewed. Constitutional: She is oriented to person, place, and time. She appears well-developed and well-nourished.  HENT:  Head: Normocephalic.  Right Ear: External ear normal.  Left Ear: External ear normal.  Eyes: Conjunctivae and EOM are normal.  Neck: Normal range of motion. Neck supple. No thyromegaly present.  Cardiovascular: Normal rate and regular rhythm.   Respiratory: Effort normal and breath sounds normal. No respiratory distress.  GI: Soft. Bowel sounds are normal. She exhibits no distension.  Musculoskeletal: She exhibits  tenderness. She exhibits no edema.  Neurological: She is alert and oriented to person, place, and time.   Alert and oriented 3. Follows commands.  Fair awareness of deficits. DTRs 3+ LUE/LLE Sensation intact to light touch Motor: RUE 4+/5 proximal to distal RLE 4+/5 proximal to distal LUE: 4/5 proximal to distal LLE hip flexion 4-/5 (previous hip surgery), ankle dorsi/plantar flexion 4/5  Skin: Skin is warm and dry.  Psychiatric: She has a normal mood and affect. Her behavior is normal. Judgment normal.    No results found for this or any previous visit (from the past 24 hour(s)). Dg Shoulder Right  10/23/2015  CLINICAL DATA:  Right shoulder pain after fall 1 day prior. EXAM: RIGHT SHOULDER - 2+ VIEW COMPARISON:  None. FINDINGS: Soft tissue anchors overlie the lateral right humeral head. No fracture, dislocation or suspicious focal osseous lesion. Mild osteoarthritis in the right acromioclavicular joint. Moderate osteoarthritis in the right glenohumeral joint, most prominent inferiorly. IMPRESSION: 1. No fracture or dislocation in the right shoulder. 2. Mild right AC joint and moderate inferior right glenohumeral joint osteoarthritis. Electronically Signed   By: Ilona Sorrel M.D.   On: 10/23/2015 14:29    Assessment/Plan: Diagnosis: Right ICH Labs and images independently reviewed.  Records reviewed and summated above.  Stroke: Continue  secondary stroke prophylaxis and Risk Factor Modification listed below:   Blood Pressure Management:  Continue current medication with prn's with permisive HTN per primary team PT/OT for mobility, ADL training  1. Does the need for close, 24 hr/day medical supervision in concert with the patient's rehab needs make it unreasonable for this patient to be served in a less intensive setting? Yes Co-Morbidities requiring supervision/potential complications: THA (ensure pain does not limit therapies), HTN (monitor and provide prns in accordance with  increased physical exertion and pain), CHF (Monitor in accordance with increased physical activity and avoid UE resistance excercises), and Anxiety (ensure anxiety and resulting apprehension do not limit functional progress; consider prn medications if warranted), spinal stenosis (cont to monitor pain and avoid excessive extension based exercises).  2. Due to safety, skin/wound care, disease management, medication administration and patient education, does the patient require 24 hr/day rehab nursing? Yes 3. Does the patient require coordinated care of a physician, rehab nurse, PT (1.5-2 hrs/day, 5 days/week) and OT (1.5-2 hrs/day, 5 days/week) to address physical and functional deficits in the context of the above medical diagnosis(es)? Yes Addressing deficits in the following areas: balance, endurance, locomotion, strength, transferring, dressing, toileting and psychosocial support 4. Can the patient actively participate in an intensive therapy program of at least 3 hrs of therapy per day at least 5 days per week? Yes 5. The potential for patient to make measurable gains while on inpatient rehab is excellent 6. Anticipated functional outcomes upon discharge from inpatient rehab are modified independent  with PT, modified independent with OT, n/a with SLP. 7. Estimated rehab length of stay to reach the above functional goals is: 16-19 days. 8. Does the patient have adequate social supports and living environment to accommodate these discharge functional goals? Yes 9. Anticipated D/C setting: Home 10. Anticipated post D/C treatments: HH therapy and Home excercise program 11. Overall Rehab/Functional Prognosis: good  RECOMMENDATIONS: This patient's condition is appropriate for continued rehabilitative care in the following setting: CIR Patient has agreed to participate in recommended program. Yes Note that insurance prior authorization may be required for reimbursement for recommended care.  Comment:  Rehab Admissions Coordinator to follow up.  Delice Lesch, MD 10/25/2015

## 2015-10-26 ENCOUNTER — Inpatient Hospital Stay (HOSPITAL_COMMUNITY): Payer: PPO

## 2015-10-26 DIAGNOSIS — D329 Benign neoplasm of meninges, unspecified: Secondary | ICD-10-CM

## 2015-10-26 DIAGNOSIS — I610 Nontraumatic intracerebral hemorrhage in hemisphere, subcortical: Secondary | ICD-10-CM

## 2015-10-26 DIAGNOSIS — I671 Cerebral aneurysm, nonruptured: Secondary | ICD-10-CM

## 2015-10-26 LAB — HEMOGLOBIN A1C
Hgb A1c MFr Bld: 5.9 % — ABNORMAL HIGH (ref 4.8–5.6)
MEAN PLASMA GLUCOSE: 123 mg/dL

## 2015-10-26 LAB — LIPID PANEL
Cholesterol: 113 mg/dL (ref 0–200)
HDL: 47 mg/dL
LDL Cholesterol: 53 mg/dL (ref 0–99)
Total CHOL/HDL Ratio: 2.4 ratio
Triglycerides: 67 mg/dL
VLDL: 13 mg/dL (ref 0–40)

## 2015-10-26 NOTE — Progress Notes (Signed)
VASCULAR LAB PRELIMINARY  PRELIMINARY  PRELIMINARY  PRELIMINARY  Carotid duplex Completed. Right ICA 40-59% stenosis , left ICA 1-39%. Vertebral arteries antegrade flow bilaterally.   Alla German, RVT 10/26/2015, 12:22 PM

## 2015-10-26 NOTE — Progress Notes (Signed)
Inpatient Rehabilitation  I met with the patient at the bedside to discuss recommendation for IP rehab.  She would like for me to pursue authorization for IP  Rehab and says if for any reason that her insurance does not approve or if no bed available, she would consider SNF in [place].  I have initiated insurance approval process .  I have notified Bashera Nixon,SW of pt's second choice of SNF for rehab.  I will follow up as soon as we have received a decision from pt's [employer].  Please call if questions.  [facility] Admissions Coordinator Cell [phone] Office [phone]

## 2015-10-26 NOTE — Progress Notes (Signed)
STROKE TEAM PROGRESS NOTE   SUBJECTIVE (INTERVAL HISTORY)  Patient is neurologically stable and has no complaints today MRI scan shows right basal ganglia hemorrhage without definite infarction. MRA shows tiny incidental 4 mm) terminal internal carotid artery aneurysm   OBJECTIVE Temp:  [97.9 F (36.6 C)-99.2 F (37.3 C)] 97.9 F (36.6 C) (11/29 0952) Pulse Rate:  [64-79] 64 (11/29 0952) Cardiac Rhythm:  [-]  Resp:  [16-20] 20 (11/29 0952) BP: (110-140)/(35-42) 112/42 mmHg (11/29 0952) SpO2:  [95 %-100 %] 99 % (11/29 0952)  CBC:   Recent Labs Lab 10/22/15 1712  WBC 7.4  NEUTROABS 4.6  HGB 13.0  HCT 39.1  MCV 91.3  PLT 254    Basic Metabolic Panel:   Recent Labs Lab 10/22/15 1712  NA 137  K 3.5  CL 102  CO2 27  GLUCOSE 141*  BUN 20  CREATININE 0.72  CALCIUM 9.4    Lipid Panel: No results found for: CHOL, TRIG, HDL, CHOLHDL, VLDL, LDLCALC HgbA1c:  Lab Results  Component Value Date   HGBA1C 5.9* 10/25/2015     IMAGING  Dg Shoulder Right 10/23/2015   1. No fracture or dislocation in the right shoulder.  2. Mild right AC joint and moderate inferior right glenohumeral joint osteoarthritis.   Ct Head Wo Contrast 10/22/2015   1. 14 x 9 mm acute hemorrhagic infarct involving the posterior limb right internal capsule and right thalamus. This is likely a hypertensive hemorrhage. 2. Extension of hemorrhage into the right lateral ventricle without hydrocephalus.  3. Remote nonhemorrhagic infarct of the left lentiform nucleus.  4. 15 mm right frontal meningioma.  5. Advanced degenerative changes in the cervical spine without evidence for acute fracture or traumatic subluxation.   Ct Cervical Spine Wo Contrast 10/22/2015    Advanced degenerative changes in the cervical spine without evidence for acute fracture or traumatic subluxation.   MRI HEAD  10/25/2015   1.2 x 1 x 0.8 cm hemorrhage posterior limb right internal capsule with surrounding mild vasogenic  edema and local mass effect. No acute thrombotic infarct. Right frontal lobe 1.5 cm meningioma with slight deformity adjacent gyri and without vasogenic edema.   MRA HEAD  10/25/2015   Cavernous and supraclinoid segment internal carotid artery atherosclerotic type changes with mild to slightly moderate irregularity and narrowing. Additionally there is a 4 mm aneurysm arising from the lateral aspect of the right internal carotid artery cavernous segment. Mild to moderate narrowing M1 segment middle cerebral artery bilaterally. Middle cerebral artery branch vessel mild irregularity and narrowing. Mild narrowing A1 and A2 segment anterior cerebral artery bilaterally. No abnormal vessels extend into posterior limb right internal capsule hemorrhage. Slight irregularity distal vertebral arteries without significant narrowing. Slight irregularity basilar artery with very mild narrowing proximal aspect. Poor delineation left anterior inferior cerebral artery. Poor delineation majority right posterior cerebral artery. Bulbous atypical appearance of the basilar tip on the left from which vessel rises without discrete separate saccular aneurysm.     2D Echocardiogram  - Left ventricle: The cavity size was normal. Wall thickness wasnormal. Systolic function was normal. The estimated ejectionfraction was in the range of 60% to 65%. Wall motion was normal;there were no regional wall motion abnormalities. The study isnot technically sufficient to allow evaluation of LV diastolicfunction. - Aortic valve: Mildly calcified annulus. Trileaflet. There wasmild regurgitation. - Mitral valve: Calcified annulus. There was trivial regurgitation. - Left atrium: The atrium was mildly dilated. - Right atrium: Central venous pressure (est): 3 mm Hg. - Atrial  septum: No defect or patent foramen ovale was identified. - Tricuspid valve: There was mild regurgitation. - Pulmonary arteries: PA peak pressure: 38 mm Hg (S). -  Pericardium, extracardiac: There was no pericardial effusion. Impressions:  Normal LV wall thickness with LVEF 60-65%. Indeterminatediastolic function. Mild left atrial enlargement. MAC withtrivial mitral regurgitation. Mildly sclerotic aortic valve withmild aortic regurgitation. Mild tricuspid regurgitation with PASP38 mmHg. No PFO or ASD.   PHYSICAL EXAM CV: RRR, no carotid bruits. No m/r/g General: NAD Neurologic Examination: Mental Status: Alert, oriented, thought content appropriate.Alert, oriented to name, date, location, situation. Follows complex commands. Speech fluent without evidence of aphasia, no dysarthria. Able to follow commands without difficulty. Cranial Nerves: II-Visual fields were normal. III/IV/VI-Pupils were equal and reacted. Extraocular movements were full and conjugate.  V/VII-no facial weakness, facial numbness of the left side improved with some mild residual sensory changes. VIII-normal. X-normal speech and symmetrical palatal movement. XI: trapezius strength/neck flexion strength normal bilaterally XII-midline tongue extension with normal strength. Motor: Mild drift of left upper extremity as well as reduced grip strength of left hand with mildly reduced fine motor of the left hand; left leg 4/5. motor exam otherwise unremarkable Sensory: Reduced sensation involving left hand, compared to the right. Deep Tendon Reflexes: 1+ and symmetric. Plantars: Flexor bilaterally Carotid auscultation: Normal    ASSESSMENT/PLAN Ms. Theresa Harris is a 81 y.o. female with history of hypertension, congestive heart failure, spinal stenosis, and anxiety presenting with numbness and weakness involving the left side of the face and left upper extremity as well as coordination difficulties of the left upper extremity . She did not receive IV t-PA due to acute intracranial hemorrhage.  Stroke:  Non-dominant R thalamic/PLIC hemorrhage vs hemorrhagic infarct, unclear  etiology. Hemorrhage is not consistent with trauma.  Resultant  Mild left face and hand numbness and very minimal left-sided weakness. (Patient has some chronic left-sided leg weakness due to hip and knee surgeries.)  CT - 14 x 9 mm acute hemorrhagic infarct involving the posterior limb right internal capsule and right thalamus.  MRI  1.2 x 1 x 0.8 cm hemorrhage posterior limb right internal capsule with surrounding mild vasogenic edema and local mass effect. No acute thrombotic infarct. Right frontal lobe 1.5 cm meningioma with slight deformity adjacent gyri and without vasogenic edema.   MRA Cavernous and supraclinoid segment internal carotid artery atherosclerotic type changes with mild to slightly moderate irregularity and narrowing. Additionally there is a 4 mm aneurysm arising from the lateral aspect of the right internal carotid artery cavernous segment. M  Carotid Doppler : Right ICA 40-59% stenosis , left ICA 1-39%. Vertebral arteries antegrade flow bilaterally.  2D Echo  :Left ventricle: The cavity size was normal. Wall thickness was normal. Systolic function was normal. The estimated ejection fraction was in the range of 60% to 65%. Wall motion was normal  HgbA1c - 5.9  VTE prophylaxis - SCDs Diet regular Room service appropriate?: Yes; Fluid consistency:: Thin  No antithrombotic prior to admission, now on No antithrombotic secondary to hemorrhage.  Ongoing aggressive stroke risk factor management  Therapy recommendations:  No ST, CIR. Consult in place  Disposition pending.   Hypertension  BP 188/50 on arrival  Stable  labetalol/hydralazine PRN for SBP <160  Other Stroke Risk Factors  Advanced age  Cigarette smoker, quit smoking   Other Active Problems  Elevated glucose - check hemoglobin A1c  R shoulder pain  X ray R shoulder - no fracture, dislocation  Loose stools  X 3  this am  Present at home, PTA since cholecystectomy  Discontinued bid  senokot   imodium prn, preparation H  Incidental R frontal meningioma and 4 mm right terminal ICA aneurysm  No intervention recommended  Hospital day # 4  I have personally examined this patient, reviewed notes, independently viewed imaging studies, participated in medical decision making and plan of care. I have made any additions or clarifications directly to the above note. Agree with note above. Small incidental meningiomas and Mobilize out of bed. Rehabilitation consult. Anticipate discharge over the next couple of days  Antony Contras, MD Medical Director [facility] Pager: [phone] 10/26/2015 12:53 PM   To contact Stroke Continuity provider, please refer to [email or web address] After hours, contact General Neurology

## 2015-10-26 NOTE — Progress Notes (Signed)
Physical Therapy Treatment Patient Details Name: Theresa Harris MRN: [id number] DOB: 06-27-1927 Today's Date: 10/26/2015    History of Present Illness pt presents R Thalamic and Internal Capsule ICH.  pt with hx of L hip fx and THA, HTN, CHF, and Anxiety.      PT Comments    Focus of session was standing activity at the sink for balance training and correction of L lateral lean. Pt progressing with use of walker, and feel pt will be able to tolerate increased distance next session. Pt states she is not a morning person, and increased encouragement was required for pt to participate. She was generally pleasant but slow to get moving OOB. Will continue to follow and progress as able per POC.   Follow Up Recommendations  CIR     Equipment Recommendations  None recommended by PT    Recommendations for Other Services Rehab consult     Precautions / Restrictions Precautions Precautions: Fall Restrictions Weight Bearing Restrictions: No    Mobility  Bed Mobility Overal bed mobility: Needs Assistance Bed Mobility: Supine to Sit     Supine to sit: Min assist     General bed mobility comments: Pt able to transition to EOB with heavy use of bed rails, and min assist for trunk support as she elevated shoulders to full sitting position. Pt swaying in sitting initially, requiring assist for safety, and was able to scoot out to EOB so feet were supported, with extra time.   Transfers Overall transfer level: Needs assistance Equipment used: Rolling walker (2 wheeled) Transfers: Sit to/from Stand Sit to Stand: Min assist         General transfer comment: VC's for hand placement on seated surface for safety. Pt was able to power-up to full standing with assist for balance/support initially.   Ambulation/Gait Ambulation/Gait assistance: Min assist;+2 safety/equipment Ambulation Distance (Feet): 15 Feet Assistive device: Rolling walker (2 wheeled) Gait Pattern/deviations:  Step-through pattern;Decreased stride length;Trunk flexed;Ataxic;Decreased stance time - left Gait velocity: Decreased Gait velocity interpretation: Below normal speed for age/gender General Gait Details: Pt with L lateral lean and drift. Assist to keep R side of walker on ground as well as to manage walker direction during turns, and frequent cues required for safe technique.    Stairs            Wheelchair Mobility    Modified Rankin (Stroke Patients Only) Modified Rankin (Stroke Patients Only) Pre-Morbid Rankin Score: Slight disability Modified Rankin: Moderately severe disability     Balance Overall balance assessment: Needs assistance;History of Falls Sitting-balance support: No upper extremity supported;Feet supported Sitting balance-Leahy Scale: Fair Sitting balance - Comments: Occasional assist required to maintain sitting balance initially Postural control: Left lateral lean Standing balance support: No upper extremity supported;During functional activity Standing balance-Leahy Scale: Poor                      Cognition Arousal/Alertness: Awake/alert Behavior During Therapy: WFL for tasks assessed/performed (Distracted) Overall Cognitive Status: Within Functional Limits for tasks assessed                      Exercises Other Exercises Other Exercises: Pt standing at sink in front of mirror. Pt was cued to watch her reflection and try to correct her posture when she began to lean to the L. Pt required many cues to stay focused on task. She was able to make corrective changes quickly at first but as she fatigued, eventually  did not make any changes.    General Comments        Pertinent Vitals/Pain Pain Assessment: No/denies pain    Home Living                      Prior Function            PT Goals (current goals can now be found in the care plan section) Acute Rehab PT Goals Patient Stated Goal: go to rehab PT Goal  Formulation: With patient Time For Goal Achievement: 11/06/15 Potential to Achieve Goals: Good Progress towards PT goals: Progressing toward goals    Frequency  Min 4X/week    PT Plan Current plan remains appropriate    Co-evaluation             End of Session Equipment Utilized During Treatment: Gait belt Activity Tolerance: Patient tolerated treatment well Patient left: in chair;with call bell/phone within reach;with chair alarm set     Time: 8882-8003 PT Time Calculation (min) (ACUTE ONLY): 28 min  Charges:  $Gait Training: 8-22 mins $Therapeutic Activity: 8-22 mins                    G Codes:      Rolinda Roan 11/10/15, 1:41 PM   Rolinda Roan, PT, DPT Acute Rehabilitation Services Pager: [phone]

## 2015-10-27 ENCOUNTER — Inpatient Hospital Stay (HOSPITAL_COMMUNITY)
Admission: RE | Admit: 2015-10-27 | Discharge: 2015-11-06 | DRG: 065 | Disposition: A | Discharge status: Home Health | Payer: PPO | Source: Intra-hospital | Attending: Physical Medicine & Rehabilitation | Admitting: Physical Medicine & Rehabilitation

## 2015-10-27 DIAGNOSIS — M170 Bilateral primary osteoarthritis of knee: Secondary | ICD-10-CM | POA: Diagnosis present

## 2015-10-27 DIAGNOSIS — S0636AA Traumatic hemorrhage of cerebrum, unspecified, with loss of consciousness status unknown, initial encounter: Secondary | ICD-10-CM | POA: Diagnosis present

## 2015-10-27 DIAGNOSIS — S06369A Traumatic hemorrhage of cerebrum, unspecified, with loss of consciousness of unspecified duration, initial encounter: Secondary | ICD-10-CM | POA: Diagnosis present

## 2015-10-27 DIAGNOSIS — I69898 Other sequelae of other cerebrovascular disease: Secondary | ICD-10-CM | POA: Diagnosis not present

## 2015-10-27 DIAGNOSIS — I610 Nontraumatic intracerebral hemorrhage in hemisphere, subcortical: Secondary | ICD-10-CM

## 2015-10-27 DIAGNOSIS — S06340A Traumatic hemorrhage of right cerebrum without loss of consciousness, initial encounter: Secondary | ICD-10-CM

## 2015-10-27 DIAGNOSIS — I10 Essential (primary) hypertension: Secondary | ICD-10-CM | POA: Diagnosis present

## 2015-10-27 DIAGNOSIS — K219 Gastro-esophageal reflux disease without esophagitis: Secondary | ICD-10-CM | POA: Diagnosis present

## 2015-10-27 DIAGNOSIS — R270 Ataxia, unspecified: Secondary | ICD-10-CM

## 2015-10-27 DIAGNOSIS — G4762 Sleep related leg cramps: Secondary | ICD-10-CM | POA: Diagnosis not present

## 2015-10-27 DIAGNOSIS — D329 Benign neoplasm of meninges, unspecified: Secondary | ICD-10-CM

## 2015-10-27 DIAGNOSIS — I69998 Other sequelae following unspecified cerebrovascular disease: Secondary | ICD-10-CM | POA: Insufficient documentation

## 2015-10-27 DIAGNOSIS — I5032 Chronic diastolic (congestive) heart failure: Secondary | ICD-10-CM | POA: Diagnosis present

## 2015-10-27 DIAGNOSIS — F419 Anxiety disorder, unspecified: Secondary | ICD-10-CM | POA: Diagnosis present

## 2015-10-27 DIAGNOSIS — I619 Nontraumatic intracerebral hemorrhage, unspecified: Secondary | ICD-10-CM | POA: Diagnosis present

## 2015-10-27 DIAGNOSIS — R209 Unspecified disturbances of skin sensation: Secondary | ICD-10-CM

## 2015-10-27 DIAGNOSIS — G8194 Hemiplegia, unspecified affecting left nondominant side: Secondary | ICD-10-CM | POA: Diagnosis present

## 2015-10-27 DIAGNOSIS — R208 Other disturbances of skin sensation: Secondary | ICD-10-CM

## 2015-10-27 MED ORDER — SORBITOL 70 % SOLN
30.0000 mL | Freq: Every day | Status: DC | PRN
Start: 2015-10-27 — End: 2015-11-06

## 2015-10-27 MED ORDER — ACETAMINOPHEN 325 MG PO TABS
650.0000 mg | ORAL_TABLET | ORAL | Status: DC | PRN
  Administered 2015-10-27 – 2015-11-03 (×3): 650 mg via ORAL
  Filled 2015-10-27 (×5): qty 2

## 2015-10-27 MED ORDER — PANTOPRAZOLE SODIUM 40 MG PO TBEC
40.0000 mg | DELAYED_RELEASE_TABLET | Freq: Two times a day (BID) | ORAL | Status: DC
  Administered 2015-10-27 – 2015-11-06 (×20): 40 mg via ORAL
  Filled 2015-10-27 (×20): qty 1

## 2015-10-27 MED ORDER — HYDROCORTISONE 1 % EX CREA
TOPICAL_CREAM | Freq: Three times a day (TID) | CUTANEOUS | Status: DC
  Administered 2015-10-27 – 2015-11-03 (×7): via TOPICAL
  Administered 2015-11-04: 1 via TOPICAL
  Filled 2015-10-27 (×2): qty 28

## 2015-10-27 MED ORDER — LOPERAMIDE HCL 2 MG PO CAPS
2.0000 mg | ORAL_CAPSULE | ORAL | Status: DC | PRN
  Administered 2015-11-04: 2 mg via ORAL
  Filled 2015-10-27: qty 1

## 2015-10-27 MED ORDER — PANTOPRAZOLE SODIUM 40 MG PO TBEC
40.0000 mg | DELAYED_RELEASE_TABLET | Freq: Two times a day (BID) | ORAL | Status: DC
  Administered 2015-10-27: 40 mg via ORAL
  Filled 2015-10-27: qty 1

## 2015-10-27 MED ORDER — LORAZEPAM 1 MG PO TABS
1.0000 mg | ORAL_TABLET | Freq: Three times a day (TID) | ORAL | Status: DC | PRN
  Administered 2015-10-27 – 2015-11-03 (×9): 1 mg via ORAL
  Filled 2015-10-27 (×9): qty 1

## 2015-10-27 MED ORDER — CALCIUM CARBONATE-VITAMIN D 500-200 MG-UNIT PO TABS
1.0000 | ORAL_TABLET | Freq: Two times a day (BID) | ORAL | Status: DC
  Administered 2015-10-27 – 2015-11-06 (×20): 1 via ORAL
  Filled 2015-10-27 (×20): qty 1

## 2015-10-27 MED ORDER — ACETAMINOPHEN 650 MG RE SUPP: 650.0000 mg | RECTAL | Status: DC | PRN

## 2015-10-27 MED ORDER — ONDANSETRON HCL 4 MG PO TABS: 4.0000 mg | ORAL_TABLET | Freq: Four times a day (QID) | ORAL | Status: DC | PRN

## 2015-10-27 MED ORDER — ONDANSETRON HCL 4 MG/2ML IJ SOLN: 4.0000 mg | Freq: Four times a day (QID) | INTRAMUSCULAR | Status: DC | PRN

## 2015-10-27 MED ORDER — METOPROLOL SUCCINATE ER 25 MG PO TB24
25.0000 mg | ORAL_TABLET | Freq: Two times a day (BID) | ORAL | Status: DC
  Administered 2015-10-27 – 2015-11-06 (×17): 25 mg via ORAL
  Filled 2015-10-27 (×20): qty 1

## 2015-10-27 MED ORDER — HYDROCHLOROTHIAZIDE 12.5 MG PO CAPS
12.5000 mg | ORAL_CAPSULE | Freq: Every day | ORAL | Status: DC
  Administered 2015-10-28 – 2015-11-06 (×10): 12.5 mg via ORAL
  Filled 2015-10-27 (×10): qty 1

## 2015-10-27 MED ORDER — MAGNESIUM OXIDE 400 (241.3 MG) MG PO TABS
400.0000 mg | ORAL_TABLET | Freq: Every day | ORAL | Status: DC
  Administered 2015-10-28 – 2015-11-06 (×10): 400 mg via ORAL
  Filled 2015-10-27 (×10): qty 1

## 2015-10-27 MED ORDER — ESTRADIOL 1 MG PO TABS
0.5000 mg | ORAL_TABLET | Freq: Every day | ORAL | Status: DC
  Administered 2015-10-28 – 2015-11-06 (×10): 0.5 mg via ORAL
  Filled 2015-10-27 (×14): qty 0.5

## 2015-10-27 NOTE — Progress Notes (Signed)
Gunnar Fusi Rehab Admission Coordinator Signed Physical Medicine and Rehabilitation PMR Pre-admission 10/27/2015 2:21 PM  Related encounter: Admission (Current) from 10/22/2015 in [place] [facility]   PMR Admission Coordinator Pre-Admission Assessment  Patient: Theresa Harris is an 81 y.o., female MRN: [id number] DOB: Nov 04, 1927 Height: 5' (152.4 cm) Weight: 58.7 kg (129 lb 6.6 oz)  Insurance Information HMO: PPO: yes PCP: IPA: 80/20: OTHER:  PRIMARY: [employer] Policy#: [id number] Subscriber: Self CM Name: Esaw Grandchild Phone#: [phone] Fax#: [phone] Pre-Cert#: [id number] x7 days, update 11/01/16 Employer: Retired Benefits: Phone #: [phone] option 2 Name: [employer]. Date: 11/27/14 Deduct: $0 Out of Pocket Max: $3400.00 Life Max: unlimited  CIR: $275 days 1-7, $0 days 8-90 SNF: $0 days 1-20, $150 days 21-100 Outpatient: PT/OT/SLP Co-Pay: $15 Home Health: No visit limit Co-Pay: $25 DME: 80% Co-Pay: 20% Providers: in network  Emergency Contact Information Contact Information    Name Relation Home Work Wagoner R Daughter [phone]     Darlyne Russian [phone]       Current Medical History  Patient Admitting Diagnosis: Right ICH  History of Present Illness: Theresa Harris is a 81 y.o. right handed female with history of hypertension, diastolic congestive heart failure, spinal stenosis. Patient lives alone in [place]. One story home with ramp and used a crutch and walker prior to admission due to history of multiple left hip surgeries. Present[facility] 10/22/2015 after a fall from a step stool  landing on her left side. Blood pressure 188/45. CT of the head showed a small parenchymal hemorrhage involving right thalamus internal capsule measuring 14 x 9 mm with extension of hemorrhage into the right lateral ventricle without hydrocephalus. Incidental finding of 15 mm right frontal meningioma. She was transferred to [facility] for further evaluation. Patient did not receive TPA secondary to [place]. Echocardiogram with ejection fraction 65% no wall motion abnormalities.. Carotid Dopplers with right 40-59% ICA stenosis. Neurology consulted with the advise of close monitoring of blood pressure. Tolerating a regular consistency diet. Physical and occupational therapy evaluations completed 10/23/2015 with recommendations of physical medicine rehabilitation consult. Patient was admitted for a comprehensive rehabilitation program  NIH Total: 3   Past Medical History  Past Medical History  Diagnosis Date  . Spinal stenosis   . CHF (congestive heart failure) ([place])   . Anxiety   . HTN (hypertension)     Family History  family history is not on file.  Prior Rehab/Hospitalizations: Patient reports going to rehab ~2 years ago for knee and shoulder problems.  Has the patient had major surgery during 100 days prior to admission? No  Current Medications   Current facility-administered medications:  . stroke: mapping our early stages of recovery book, , Does not apply, Once, Wallie Char . acetaminophen (TYLENOL) tablet 650 mg, 650 mg, Oral, Q4H PRN, 650 mg at 10/26/15 2348 **OR** acetaminophen (TYLENOL) suppository 650 mg, 650 mg, Rectal, Q4H PRN, Wallie Char . alum & mag hydroxide-simeth (MAALOX/MYLANTA) 200-200-20 MG/5ML suspension 30 mL, 30 mL, Oral, Q4H PRN, David L Rinehuls, PA-C, 30 mL at 10/24/15 1656 . calcium-vitamin D (OSCAL WITH D) 500-200 MG-UNIT per tablet 1 tablet, 1 tablet, Oral, BID, Leotis Pain, MD, 1 tablet at 10/26/15 2140 . estradiol  (ESTRACE) tablet 0.5 mg, 0.5 mg, Oral, Daily, Leotis Pain, MD, 0.5 mg at 10/27/15 0949 . hydrochlorothiazide (MICROZIDE) capsule 12.5 mg, 12.5 mg, Oral, Daily, Berta Minor  Alfredia Ferguson, MD, 12.5 mg at 10/26/15 0914 . hydrocortisone cream 1 %, , Topical, TID, Donzetta Starch, NP . labetalol (NORMODYNE,TRANDATE) injection 10-40 mg, 10-40 mg, Intravenous, Q10 min PRN, Wallie Char, 20 mg at 10/23/15 1623 . loperamide (IMODIUM) capsule 2 mg, 2 mg, Oral, PRN, Donzetta Starch, NP . LORazepam (ATIVAN) tablet 1 mg, 1 mg, Oral, TID PRN, Leotis Pain, MD, 1 mg at 10/27/15 1023 . magnesium oxide (MAG-OX) tablet 400 mg, 400 mg, Oral, Daily, Leotis Pain, MD, 400 mg at 10/27/15 0949 . metoprolol succinate (TOPROL-XL) 24 hr tablet 25 mg, 25 mg, Oral, BID, Leotis Pain, MD, 25 mg at 10/26/15 2141 . ondansetron (ZOFRAN) injection 4 mg, 4 mg, Intravenous, Q6H PRN, Melvenia Beam, MD, 4 mg at 10/24/15 0823 . pantoprazole (PROTONIX) EC tablet 40 mg, 40 mg, Oral, BID, Donzetta Starch, NP, 40 mg at 10/27/15 1032  Patients Current Diet: Diet regular Room service appropriate?: Yes; Fluid consistency:: Thin  Precautions / Restrictions Precautions Precautions: Fall Restrictions Weight Bearing Restrictions: No   Has the patient had 2 or more falls or a fall with injury in the past year? No, except for fall that occurred with this admission   Prior Activity Level Limited Community (1-2x/wk): Patient reports that she drove to her appointments, ran errands and attended church weekly, but would try to do as many errands as possible when she went out so that she was not out of the house daily.   Home Assistive Devices / Equipment Home Assistive Devices/Equipment: Crutches Home Equipment: Hand held shower head, Tub bench, Bedside commode, Walker - 2 wheels, Cane - single point, Under arm crutch  Prior Device Use: Indicate devices/aids used by the patient prior to current illness, exacerbation or injury?  Walker  Prior Functional Level Prior Function Level of Independence: Independent with assistive device(s) Comments: pt reports using a walker in the home  Self Care: Did the patient need help bathing, dressing, using the toilet or eating? Independent with assistive device   Indoor Mobility: Did the patient need assistance with walking from room to room (with or without device)? Independent with assistive device   Stairs: Did the patient need assistance with internal or external stairs (with or without device)? Independent with assistive device  Functional Cognition: Did the patient need help planning regular tasks such as shopping or remembering to take medications? Independent  Current Functional Level Cognition  Overall Cognitive Status: Within Functional Limits for tasks assessed Orientation Level: Oriented X4   Extremity Assessment (includes Sensation/Coordination)  Upper Extremity Assessment: LUE deficits/detail, RUE deficits/detail RUE Deficits / Details: h/o frozen shoulder LUE Deficits / Details: decreased shoulder strength (3/5), elbow to distally WFL; pt with complaints of tingling sensation in left hand; decreased coordination   Lower Extremity Assessment: Defer to PT evaluation LLE Deficits / Details: Strength grossly 4-/5, decreased proprioception, ataxic movement.  LLE Sensation: decreased proprioception LLE Coordination: decreased fine motor, decreased gross motor    ADLs  Overall ADL's : Needs assistance/impaired Eating/Feeding: Set up, Sitting Grooming: Set up, Sitting Upper Body Bathing: Minimal assitance, Sitting Lower Body Bathing: Moderate assistance, Sit to/from stand Upper Body Dressing : Minimal assistance, Sitting Lower Body Dressing: Moderate assistance, Sit to/from stand Toilet Transfer: Moderate assistance, Stand-pivot, BSC Toileting- Clothing Manipulation and Hygiene: Minimal assistance, Sitting/lateral lean Functional mobility during  ADLs: Moderate assistance General ADL Comments: Pt with left lateral lean with dynamic sitting and standing activities.     Mobility  Overal bed mobility: Needs Assistance Bed Mobility: Supine  to Sit Supine to sit: Min assist General bed mobility comments: Pt able to transition to EOB with heavy use of bed rails, and min assist for trunk support as she elevated shoulders to full sitting position. Pt swaying in sitting initially, requiring assist for safety, and was able to scoot out to EOB so feet were supported, with extra time.     Transfers  Overall transfer level: Needs assistance Equipment used: Rolling walker (2 wheeled) Transfers: Sit to/from Stand Sit to Stand: Min assist Stand pivot transfers: Min assist General transfer comment: VC's for hand placement on seated surface for safety. Pt was able to power-up to full standing with assist for balance/support initially.     Ambulation / Gait / Stairs / Wheelchair Mobility  Ambulation/Gait Ambulation/Gait assistance: Min assist, +2 safety/equipment Ambulation Distance (Feet): 15 Feet Assistive device: Rolling walker (2 wheeled) Gait Pattern/deviations: Step-through pattern, Decreased stride length, Trunk flexed, Ataxic, Decreased stance time - left General Gait Details: Pt with L lateral lean and drift. Assist to keep R side of walker on ground as well as to manage walker direction during turns, and frequent cues required for safe technique.  Gait velocity: Decreased Gait velocity interpretation: Below normal speed for age/gender    Posture / Balance Dynamic Sitting Balance Sitting balance - Comments: Occasional assist required to maintain sitting balance initially Balance Overall balance assessment: Needs assistance, History of Falls Sitting-balance support: No upper extremity supported, Feet supported Sitting balance-Leahy Scale: Fair Sitting balance - Comments: Occasional assist required to maintain sitting balance  initially Postural control: Left lateral lean Standing balance support: No upper extremity supported, During functional activity Standing balance-Leahy Scale: Poor    Special needs/care consideration BiPAP/CPAP n/a CPM n/a Continuous Drip IV n/a Dialysis n/a  Life Vest n/a Oxygen n/a Special Bed n/a Trach Size n/a  Wound Vac (area) n/a  Skin n/a  Bowel mgmt: 11/30 urgency with some incontinence when she cannot make it there in time  Bladder mgmt: 11/30 urgency  Diabetic mgmt HgbA1c - 5.9    Previous Home Environment Living Arrangements: Alone Lives With: Alone Available Help at Discharge: (none, "unless I hire someone.Marland KitchenMarland KitchenI have long term health insurance") Type of Home: House Home Layout: One level Home Access: Other (comment) (3 steps to enter through front door, ramped back entrance) Bathroom Shower/Tub: Tub/shower unit, Architectural technologist: Handicapped height Bathroom Accessibility: No (not with a walker ) Home Care Services: No  Discharge Living Setting Plans for Discharge Living Setting: Patient's home Type of Home at Discharge: House Discharge Home Layout: One level Discharge Home Access: Stairs to enter, Ramped entrance (3 steps to enter in front and ramped back entrance ) Entrance Stairs-Number of Steps: 3 in the front Discharge Bathroom Shower/Tub: Tub/shower unit, Curtain Discharge Bathroom Toilet: Handicapped height Discharge Bathroom Accessibility: No (not with a walker) Does the patient have any problems obtaining your medications?: No  Social/Family/Support Systems Patient Roles: Parent, Other (Comment) (sister) Anticipated Caregiver: n/a patient with Mod I goals Discharge Plan Discussed with Primary Caregiver: No (n/a)  Goals/Additional Needs Patient/Family Goal for Rehab: Mod I OT/PT Expected length of stay: 16-19 days Cultural Considerations: [employer]  Dietary Needs: none Equipment Needs: TBA Special Service Needs: None Pt/Family  Agrees to Admission and willing to participate: Yes Program Orientation Provided & Reviewed with Pt/Caregiver Including Roles & Responsibilities: Yes  Decrease burden of Care through IP rehab admission: not anticipated at this time   Possible need for SNF placement upon discharge: not anticipated at this time; patient  with predicted Mod I goals  Patient Condition: This patient's medical and functional status has changed since the consult dated: 10/25/15 in which the Rehabilitation Physician determined and documented that the patient's condition is appropriate for intensive rehabilitative care in an inpatient rehabilitation facility. See "History of Present Illness" (above) for medical update. Functional changes are: currently requiring Min assist +2 for 15 feet . Patient's medical and functional status update has been discussed with the Rehabilitation physician and patient remains appropriate for inpatient rehabilitation. Will admit to inpatient rehab today.  Preadmission Screen Completed By: Gunnar Fusi, 10/27/2015 3:22 PM ______________________________________________________________________  Discussed status with Dr. Letta Pate on 10/27/15 at 1522 and received telephone approval for admission today.  Admission Coordinator: Gunnar Fusi, time 1522/Date 10/27/15          Cosigned by: Charlett Blake, MD at 10/27/2015 3:34 PM  Revision History     Date/Time User Provider Type Action   10/27/2015 3:34 PM Charlett Blake, MD Physician Cosign   10/27/2015 3:23 PM Gunnar Fusi Rehab Admission Coordinator Sign

## 2015-10-27 NOTE — Progress Notes (Signed)
[facility] notified that patient was still eating and will be transferred as soon as she finishes.

## 2015-10-27 NOTE — Discharge Summary (Signed)
Stroke Discharge Summary  Patient ID: Theresa Harris   MRN: [id number]      DOB: 1927-09-22  Date of Admission: 10/22/2015 Date of Discharge: 10/27/2015  Attending Physician:  Garvin Fila, MD, Stroke MD  Consulting Physician(s):    Delice Lesch, MD (Physical Medicine & Rehabtilitation)   Discharge Diagnoses:  1. Stroke: Non-dominant R thalamic/PLIC hemorrhage, likely hypertensive  2. Resultant Mild left face and hand numbness and very minimal left-sided weakness.  3. Hypertension 4. Cigarette smoker 5. Elevated glucose  6. R shoulder pain 7. Loose stools, hemorrhoids 8. Incidental R frontal meningioma 9. Incidental 4 mm right terminal ICA aneurysm   Past Medical History  Diagnosis Date  . Spinal stenosis   . CHF (congestive heart failure) ([place])   . Anxiety   . HTN (hypertension)    Past Surgical History  Procedure Laterality Date  . Knee surgery      right  . Abdominal hysterectomy    . Hip surgery    . Cholecystectomy    . Carpal tunnel release      Medications to be continued on Rehab .  stroke: mapping our early stages of recovery book   Does not apply Once  . calcium-vitamin D  1 tablet Oral BID  . estradiol  0.5 mg Oral Daily  . hydrochlorothiazide  12.5 mg Oral Daily  . hydrocortisone cream   Topical TID  . magnesium oxide  400 mg Oral Daily  . metoprolol succinate  25 mg Oral BID  . pantoprazole  40 mg Oral BID    LABORATORY STUDIES CBC    Component Value Date/Time   WBC 7.4 10/22/2015 1712   WBC 6.2 08/19/2014 1153   RBC 4.28 10/22/2015 1712   RBC 4.11 08/19/2014 1153   HGB 13.0 10/22/2015 1712   HGB 12.9 08/19/2014 1153   HCT 39.1 10/22/2015 1712   HCT 38.9 08/19/2014 1153   PLT 201 10/22/2015 1712   PLT 221 08/19/2014 1153   MCV 91.3 10/22/2015 1712   MCV 95 08/19/2014 1153   MCH 30.3 10/22/2015 1712   MCH 31.4 08/19/2014 1153   MCHC 33.2 10/22/2015 1712   MCHC 33.1 08/19/2014 1153   RDW 13.7 10/22/2015 1712   RDW 13.3  08/19/2014 1153   LYMPHSABS 2.0 10/22/2015 1712   LYMPHSABS 1.7 08/19/2014 1153   MONOABS 0.8 10/22/2015 1712   MONOABS 0.6 08/19/2014 1153   EOSABS 0.1 10/22/2015 1712   EOSABS 0.0 08/19/2014 1153   BASOSABS 0.0 10/22/2015 1712   BASOSABS 0.0 08/19/2014 1153   CMP    Component Value Date/Time   NA 137 10/22/2015 1712   NA 135* 06/30/2014 1025   K 3.5 10/22/2015 1712   K 3.7 06/30/2014 1025   CL 102 10/22/2015 1712   CL 98 06/30/2014 1025   CO2 27 10/22/2015 1712   CO2 27 06/30/2014 1025   GLUCOSE 141* 10/22/2015 1712   GLUCOSE 104* 06/30/2014 1025   BUN 20 10/22/2015 1712   BUN 14 06/30/2014 1025   CREATININE 0.72 10/22/2015 1712   CREATININE 0.86 06/30/2014 1025   CALCIUM 9.4 10/22/2015 1712   CALCIUM 9.1 06/30/2014 1025   PROT 7.2 10/22/2015 1712   ALBUMIN 4.0 10/22/2015 1712   AST 25 10/22/2015 1712   ALT 12* 10/22/2015 1712   ALKPHOS 54 10/22/2015 1712   BILITOT 0.4 10/22/2015 1712   GFRNONAA >60 10/22/2015 1712   GFRNONAA >60 06/30/2014 1025   GFRAA >60 10/22/2015 1712  GFRAA >60 06/30/2014 1025   COAGS Lab Results  Component Value Date   INR 1.08 10/22/2015   Lipid Panel    Component Value Date/Time   CHOL 113 10/26/2015 1128   TRIG 67 10/26/2015 1128   HDL 47 10/26/2015 1128   CHOLHDL 2.4 10/26/2015 1128   VLDL 13 10/26/2015 1128   LDLCALC 53 10/26/2015 1128   HgbA1C  Lab Results  Component Value Date   HGBA1C 5.9* 10/25/2015   Cardiac Panel (last 3 results) No results for input(s): CKTOTAL, CKMB, TROPONINI, RELINDX in the last 72 hours. Urinalysis No results found for: COLORURINE, APPEARANCEUR, LABSPEC, PHURINE, GLUCOSEU, HGBUR, BILIRUBINUR, KETONESUR, PROTEINUR, UROBILINOGEN, NITRITE, LEUKOCYTESUR Urine Drug Screen No results found for: LABOPIA, COCAINSCRNUR, LABBENZ, AMPHETMU, THCU, LABBARB  Alcohol Level No results found for: Mutual Dg Shoulder Right 10/23/2015  1. No fracture or dislocation in the  right shoulder.  2. Mild right AC joint and moderate inferior right glenohumeral joint osteoarthritis.   Ct Head Wo Contrast 10/22/2015  1. 14 x 9 mm acute hemorrhagic infarct involving the posterior limb right internal capsule and right thalamus. This is likely a hypertensive hemorrhage. 2. Extension of hemorrhage into the right lateral ventricle without hydrocephalus.  3. Remote nonhemorrhagic infarct of the left lentiform nucleus.  4. 15 mm right frontal meningioma.  5. Advanced degenerative changes in the cervical spine without evidence for acute fracture or traumatic subluxation.   Ct Cervical Spine Wo Contrast 10/22/2015  Advanced degenerative changes in the cervical spine without evidence for acute fracture or traumatic subluxation.   MRI HEAD  10/25/2015  1.2 x 1 x 0.8 cm hemorrhage posterior limb right internal capsule with surrounding mild vasogenic edema and local mass effect. No acute thrombotic infarct. Right frontal lobe 1.5 cm meningioma with slight deformity adjacent gyri and without vasogenic edema.   MRA HEAD  10/25/2015  Cavernous and supraclinoid segment internal carotid artery atherosclerotic type changes with mild to slightly moderate irregularity and narrowing. Additionally there is a 4 mm aneurysm arising from the lateral aspect of the right internal carotid artery cavernous segment. Mild to moderate narrowing M1 segment middle cerebral artery bilaterally. Middle cerebral artery branch vessel mild irregularity and narrowing. Mild narrowing A1 and A2 segment anterior cerebral artery bilaterally. No abnormal vessels extend into posterior limb right internal capsule hemorrhage. Slight irregularity distal vertebral arteries without significant narrowing. Slight irregularity basilar artery with very mild narrowing proximal aspect. Poor delineation left anterior inferior cerebral artery. Poor delineation majority right posterior cerebral artery. Bulbous atypical  appearance of the basilar tip on the left from which vessel rises without discrete separate saccular aneurysm.   2D Echocardiogram  - Left ventricle: The cavity size was normal. Wall thickness wasnormal. Systolic function was normal. The estimated ejectionfraction was in the range of 60% to 65%. Wall motion was normal;there were no regional wall motion abnormalities. The study isnot technically sufficient to allow evaluation of LV diastolicfunction. - Aortic valve: Mildly calcified annulus. Trileaflet. There wasmild regurgitation. - Mitral valve: Calcified annulus. There was trivial regurgitation. - Left atrium: The atrium was mildly dilated. - Right atrium: Central venous pressure (est): 3 mm Hg. - Atrial septum: No defect or patent foramen ovale was identified. - Tricuspid valve: There was mild regurgitation. - Pulmonary arteries: PA peak pressure: 38 mm Hg (S). - Pericardium, extracardiac: There was no pericardial effusion. Impressions: Normal LV wall thickness with LVEF 60-65%. Indeterminatediastolic function. Mild left atrial enlargement. MAC withtrivial mitral regurgitation. Mildly sclerotic  aortic valve withmild aortic regurgitation. Mild tricuspid regurgitation with PASP38 mmHg. No PFO or ASD.  Carotid Doppler   Right internal carotid artery findings are consistent with a 40-59% stenosis. Left internal carotid artery findings are consistent with 1-39% stenosis. Moderate heterogenous plaque with 40-59% RICA stenosis. mild plaque with 1-61% LICA stenosis. Both vertebrals have antegrade flow.     HISTORY OF PRESENT ILLNES Theresa Harris is an 81 y.o. female history of hypertension, congestive heart failure, spinal stenosis and anxiety, transferred from [facility] with an acute hemorrhage or parenchymal right cerebral hemorrhage. Patient was last known well at about 1300 today 10/22/2015. She was standing on a step stool at the time of symptom onset and fell to her left side. CT scan  of her head showed a small to parenchymal hemorrhage involving right thalamus and internal capsule. There was no significant mass effect. NIH stroke score was 3. Patient has no previous history of stroke or TIA.    HOSPITAL COURSE Theresa Harris is a 81 y.o. female with history of hypertension, congestive heart failure, spinal stenosis, and anxiety presenting with numbness and weakness involving the left side of the face and left upper extremity as well as coordination difficulties of the left upper extremity . She did not receive IV t-PA due to acute intracranial hemorrhage.  Stroke: Non-dominant R thalamic/PLIC hemorrhage, likely hypertensive. Hemorrhage is not consistent with trauma.  Resultant Mild left face and hand numbness and very minimal left-sided weakness. (Patient has some chronic left-sided leg weakness due to hip and knee surgeries.)  No antithrombotic prior to admission  CT - 14 x 9 mm acute hemorrhagic infarct involving the posterior limb right internal capsule and right thalamus.  MRI 1.2 x 1 x 0.8 cm hemorrhage posterior limb right internal capsule with surrounding mild vasogenic edema and local mass effect. No acute thrombotic infarct. Right frontal lobe 1.5 cm meningioma with slight deformity adjacent gyri and without vasogenic edema.   MRA Cavernous and supraclinoid segment internal carotid artery atherosclerotic type changes with mild to slightly moderate irregularity and narrowing. Additionally there is a 4 mm aneurysm arising from the lateral aspect of the right internal carotid artery cavernous segment. M  Carotid Doppler: Right ICA 40-59% stenosis   2D Echo No source of embolus   HgbA1c - 5.9  Ongoing aggressive stroke risk factor management  Therapy recommendations: CIR.   Disposition CIR  Hypertension  BP 188/50 on arrival  Stable  Long term BP control < 130/80  Other Stroke Risk Factors  Advanced age  Cigarette smoker, quit  smoking  Other Active Problems  Elevated glucose - hemoglobin A1c WNL  R shoulder pain  X ray R shoulder - no fracture, dislocation  Loose stools, hemorrhoids  X 3 this am  Present at home, PTA since cholecystectomy  Discontinued bid senokot  imodium prn, preparation H  Incidental R frontal meningioma Incidental 4 mm right terminal ICA aneurysm  No intervention recommended   DISCHARGE EXAM Blood pressure 137/37, pulse 51, temperature 97.7 F (36.5 C), temperature source Oral, resp. rate 16, height 5' (1.524 m), weight 58.7 kg (129 lb 6.6 oz), SpO2 96 %. CV: RRR, no carotid bruits. No m/r/g General: NAD Neurologic Examination: Mental Status: Alert, oriented, thought content appropriate.Alert, oriented to name, date, location, situation. Follows complex commands. Speech fluent without evidence of aphasia, no dysarthria. Able to follow commands without difficulty. Cranial Nerves: II-Visual fields were normal. III/IV/VI-Pupils were equal and reacted. Extraocular movements were full and conjugate.  V/VII-no facial weakness, facial numbness of the left side improved with some mild residual sensory changes. VIII-normal. X-normal speech and symmetrical palatal movement. XI: trapezius strength/neck flexion strength normal bilaterally XII-midline tongue extension with normal strength. Motor: Mild drift of left upper extremity as well as reduced grip strength of left hand with mildly reduced fine motor of the left hand; left leg 4/5. motor exam otherwise unremarkable Sensory: Reduced sensation involving left hand, compared to the right. Deep Tendon Reflexes: 1+ and symmetric. Plantars: Flexor bilaterally Carotid auscultation: Normal   Discharge Diet  Diet regular Room service appropriate?: Yes; Fluid consistency:: Thin liquids  DISCHARGE PLAN  Disposition:  Transfer to [place] for ongoing PT, OT and ST  Due to hemorrhage and risk of bleeding, do not  take aspirin, aspirin-containing medications, or ibuprofen products   Recommend ongoing risk factor control by Primary Care Physician at time of discharge from inpatient rehabilitation.  Follow-up No primary care provider on file. in 2 weeks following discharge from rehab.  Follow-up with Dr. Antony Contras, Stroke Clinic in 2 months.   35 minutes were spent preparing discharge.  Gilliam [place] for Pager information 10/29/2015 4:20 PM     I have personally examined this patient, reviewed notes, independently viewed imaging studies, participated in medical decision making and plan of care. I have made any additions or clarifications directly to the above note. Agree with note above.  Antony Contras, MD Medical Director [facility] Pager: [phone] 10/29/2015 5:28 PM

## 2015-10-27 NOTE — Progress Notes (Signed)
STROKE TEAM PROGRESS NOTE   SUBJECTIVE (INTERVAL HISTORY)  Patient is neurologically stable and has no complaints today .She is stable to go to rehabilitation today OBJECTIVE Temp:  [97.6 F (36.4 C)-98.2 F (36.8 C)] 97.7 F (36.5 C) (11/30 0908) Pulse Rate:  [51-77] 51 (11/30 0908) Cardiac Rhythm:  [-] Normal sinus rhythm (11/30 0800) Resp:  [16-20] 16 (11/30 0908) BP: (112-146)/(37-56) 137/37 mmHg (11/30 0908) SpO2:  [96 %-99 %] 96 % (11/30 0908)  CBC:   Recent Labs Lab 10/22/15 1712  WBC 7.4  NEUTROABS 4.6  HGB 13.0  HCT 39.1  MCV 91.3  PLT 458    Basic Metabolic Panel:   Recent Labs Lab 10/22/15 1712  NA 137  K 3.5  CL 102  CO2 27  GLUCOSE 141*  BUN 20  CREATININE 0.72  CALCIUM 9.4    Lipid Panel:     Component Value Date/Time   CHOL 113 10/26/2015 1128   TRIG 67 10/26/2015 1128   HDL 47 10/26/2015 1128   CHOLHDL 2.4 10/26/2015 1128   VLDL 13 10/26/2015 1128   LDLCALC 53 10/26/2015 1128   HgbA1c:  Lab Results  Component Value Date   HGBA1C 5.9* 10/25/2015     IMAGING  Dg Shoulder Right 10/23/2015   1. No fracture or dislocation in the right shoulder.  2. Mild right AC joint and moderate inferior right glenohumeral joint osteoarthritis.   Ct Head Wo Contrast 10/22/2015   1. 14 x 9 mm acute hemorrhagic infarct involving the posterior limb right internal capsule and right thalamus. This is likely a hypertensive hemorrhage. 2. Extension of hemorrhage into the right lateral ventricle without hydrocephalus.  3. Remote nonhemorrhagic infarct of the left lentiform nucleus.  4. 15 mm right frontal meningioma.  5. Advanced degenerative changes in the cervical spine without evidence for acute fracture or traumatic subluxation.   Ct Cervical Spine Wo Contrast 10/22/2015    Advanced degenerative changes in the cervical spine without evidence for acute fracture or traumatic subluxation.   MRI HEAD  10/25/2015   1.2 x 1 x 0.8 cm hemorrhage  posterior limb right internal capsule with surrounding mild vasogenic edema and local mass effect. No acute thrombotic infarct. Right frontal lobe 1.5 cm meningioma with slight deformity adjacent gyri and without vasogenic edema.   MRA HEAD  10/25/2015   Cavernous and supraclinoid segment internal carotid artery atherosclerotic type changes with mild to slightly moderate irregularity and narrowing. Additionally there is a 4 mm aneurysm arising from the lateral aspect of the right internal carotid artery cavernous segment. Mild to moderate narrowing M1 segment middle cerebral artery bilaterally. Middle cerebral artery branch vessel mild irregularity and narrowing. Mild narrowing A1 and A2 segment anterior cerebral artery bilaterally. No abnormal vessels extend into posterior limb right internal capsule hemorrhage. Slight irregularity distal vertebral arteries without significant narrowing. Slight irregularity basilar artery with very mild narrowing proximal aspect. Poor delineation left anterior inferior cerebral artery. Poor delineation majority right posterior cerebral artery. Bulbous atypical appearance of the basilar tip on the left from which vessel rises without discrete separate saccular aneurysm.     2D Echocardiogram  - Left ventricle: The cavity size was normal. Wall thickness wasnormal. Systolic function was normal. The estimated ejectionfraction was in the range of 60% to 65%. Wall motion was normal;there were no regional wall motion abnormalities. The study isnot technically sufficient to allow evaluation of LV diastolicfunction. - Aortic valve: Mildly calcified annulus. Trileaflet. There wasmild regurgitation. - Mitral valve: Calcified annulus.  There was trivial regurgitation. - Left atrium: The atrium was mildly dilated. - Right atrium: Central venous pressure (est): 3 mm Hg. - Atrial septum: No defect or patent foramen ovale was identified. - Tricuspid valve: There was mild  regurgitation. - Pulmonary arteries: PA peak pressure: 38 mm Hg (S). - Pericardium, extracardiac: There was no pericardial effusion. Impressions:  Normal LV wall thickness with LVEF 60-65%. Indeterminatediastolic function. Mild left atrial enlargement. MAC withtrivial mitral regurgitation. Mildly sclerotic aortic valve withmild aortic regurgitation. Mild tricuspid regurgitation with PASP38 mmHg. No PFO or ASD.   PHYSICAL EXAM CV: RRR, no carotid bruits. No m/r/g General: NAD Neurologic Examination: Mental Status: Alert, oriented, thought content appropriate.Alert, oriented to name, date, location, situation. Follows complex commands. Speech fluent without evidence of aphasia, no dysarthria. Able to follow commands without difficulty. Cranial Nerves: II-Visual fields were normal. III/IV/VI-Pupils were equal and reacted. Extraocular movements were full and conjugate.  V/VII-no facial weakness, facial numbness of the left side improved with some mild residual sensory changes. VIII-normal. X-normal speech and symmetrical palatal movement. XI: trapezius strength/neck flexion strength normal bilaterally XII-midline tongue extension with normal strength. Motor: Mild drift of left upper extremity as well as reduced grip strength of left hand with mildly reduced fine motor of the left hand; left leg 4/5. motor exam otherwise unremarkable Sensory: Reduced sensation involving left hand, compared to the right. Deep Tendon Reflexes: 1+ and symmetric. Plantars: Flexor bilaterally Carotid auscultation: Normal    ASSESSMENT/PLAN Ms. Theresa Harris is a 81 y.o. female with history of hypertension, congestive heart failure, spinal stenosis, and anxiety presenting with numbness and weakness involving the left side of the face and left upper extremity as well as coordination difficulties of the left upper extremity . She did not receive IV t-PA due to acute intracranial hemorrhage.  Stroke:   Non-dominant R thalamic/PLIC hemorrhage vs hemorrhagic infarct, unclear etiology. Hemorrhage is not consistent with trauma.  Resultant  Mild left face and hand numbness and very minimal left-sided weakness. (Patient has some chronic left-sided leg weakness due to hip and knee surgeries.)  CT - 14 x 9 mm acute hemorrhagic infarct involving the posterior limb right internal capsule and right thalamus.  MRI  1.2 x 1 x 0.8 cm hemorrhage posterior limb right internal capsule with surrounding mild vasogenic edema and local mass effect. No acute thrombotic infarct. Right frontal lobe 1.5 cm meningioma with slight deformity adjacent gyri and without vasogenic edema.   MRA Cavernous and supraclinoid segment internal carotid artery atherosclerotic type changes with mild to slightly moderate irregularity and narrowing. Additionally there is a 4 mm aneurysm arising from the lateral aspect of the right internal carotid artery cavernous segment. M  Carotid Doppler : Right ICA 40-59% stenosis , left ICA 1-39%. Vertebral arteries antegrade flow bilaterally.  2D Echo  :Left ventricle: The cavity size was normal. Wall thickness was normal. Systolic function was normal. The estimated ejection fraction was in the range of 60% to 65%. Wall motion was normal  HgbA1c - 5.9  VTE prophylaxis - SCDs Diet regular Room service appropriate?: Yes; Fluid consistency:: Thin  No antithrombotic prior to admission, now on No antithrombotic secondary to hemorrhage.  Ongoing aggressive stroke risk factor management  Therapy recommendations:  No ST, CIR. Consult in place  Disposition pending.   Hypertension  BP 188/50 on arrival  Stable  labetalol/hydralazine PRN for SBP <160  Other Stroke Risk Factors  Advanced age  Cigarette smoker, quit smoking   Other Active Problems  Elevated  glucose - check hemoglobin A1c  R shoulder pain  X ray R shoulder - no fracture, dislocation  Loose stools  X 3 this  am  Present at home, PTA since cholecystectomy  Discontinued bid senokot   imodium prn, preparation H  Incidental R frontal meningioma and 4 mm right terminal ICA aneurysm  No intervention recommended  Hospital day # 5  I have personally examined this patient, reviewed notes, independently viewed imaging studies, participated in medical decision making and plan of care. I have made any additions or clarifications directly to the above note. Agree with note above. Small incidental meningiomas   Anticipate discharge to rehab today    Antony Contras, MD Medical Director [facility] Pager: [phone] 10/27/2015 1:22 PM   To contact Stroke Continuity provider, please refer to [email or web address] After hours, contact General Neurology

## 2015-10-27 NOTE — Progress Notes (Signed)
Ankit Lorie Phenix, MD Physician Addendum Physical Medicine and Rehabilitation Consult Note 10/25/2015 7:02 AM  Related encounter: Admission (Current) from 10/22/2015 in [place] Collapse All        Physical Medicine and Rehabilitation Consult Reason for Consult: Traumatic ICH Referring Physician: Dr.Xu   HPI: Theresa Harris is a 81 y.o. right handed female with history of hypertension, systolic congestive heart failure, spinal stenosis. Patient lives alone in [place] 1 story home and used a crutch and walker prior to admission due to history of multiple left hip surgeries. One level home with ramp. Presented to [facility] 10/22/2015 after a fall from a step stool landing on her left side. Blood pressure 188/45. CT of the head showed a small parenchymal hemorrhage involving right thalamus internal capsule measuring 14 x 9 mm with extension of hemorrhage into the right lateral ventricle without hydrocephalus. Incidental finding of 15 mm right frontal meningioma. She was transferred to [facility] for further evaluation. Patient did not receive TPA secondary to [place]. Echocardiogram is pending. Neurology consulted with the advise of close monitoring of blood pressure. Tolerating a regular consistency diet. Physical therapy evaluation completed 10/23/2015 with recommendations of physical medicine rehabilitation consult.  Review of Systems  Constitutional: Negative for fever and chills.  HENT: Negative for hearing loss.   Occasional headaches  Eyes: Negative for blurred vision and double vision.  Respiratory: Negative for cough.   Occasional shortness of breath with exertion  Cardiovascular: Positive for leg swelling. Negative for chest pain and palpitations.  Gastrointestinal: Positive for diarrhea. Negative for nausea and vomiting.  Genitourinary: Negative for dysuria and hematuria.    Musculoskeletal: Positive for myalgias, back pain, joint pain and falls.  Skin: Negative for rash.  Neurological: Positive for dizziness and tingling. Negative for seizures and weakness.   Spinal stenosis  Psychiatric/Behavioral:   Anxiety  All other systems reviewed and are negative.  Past Medical History  Diagnosis Date  . Spinal stenosis   . CHF (congestive heart failure) ([place])   . Anxiety   . HTN (hypertension)    Past Surgical History  Procedure Laterality Date  . Knee surgery      right  . Abdominal hysterectomy    . Hip surgery    . Cholecystectomy    . Carpal tunnel release     History reviewed. No pertinent family history. Social History:  reports that she has quit smoking. She does not have any smokeless tobacco history on file. She reports that she does not drink alcohol or use illicit drugs. Allergies:  Allergies  Allergen Reactions  . Morphine And Related Nausea And Vomiting   Medications Prior to Admission  Medication Sig Dispense Refill  . acetaminophen (TYLENOL) 500 MG tablet Take 500 mg by mouth every 6 (six) hours as needed for mild pain.    Marland Kitchen b complex vitamins tablet Take 1 tablet by mouth daily.    . Calcium Carbonate-Vitamin D (CALCIUM 600+D) 600-400 MG-UNIT tablet Take 1 tablet by mouth 2 (two) times daily.    Marland Kitchen estradiol (ESTRACE) 1 MG tablet Take 0.5 mg by mouth daily.     . hydrochlorothiazide (HYDRODIURIL) 25 MG tablet Take 25 mg by mouth daily.    . magnesium oxide (MAG-OX) 400 MG tablet Take 400 mg by mouth daily.    . metoprolol succinate (TOPROL-XL) 25 MG 24 hr tablet Take 25 mg by mouth 2 (two) times  daily.    . omeprazole (PRILOSEC) 20 MG capsule Take 20 mg by mouth 2 (two) times daily.    Marland Kitchen oxazepam (SERAX) 15 MG capsule Take 15 mg by mouth 3 (three) times daily as needed for sleep.    Marland Kitchen tiZANidine (ZANAFLEX) 2 MG tablet Take 2 mg  by mouth every 8 (eight) hours as needed for muscle spasms.    Marland Kitchen triamcinolone (NASACORT ALLERGY 24HR) 55 MCG/ACT AERO nasal inhaler Place 2 sprays into the nose daily as needed (for rhinitis).      Home: Home Living Family/patient expects to be discharged to:: Inpatient rehab Living Arrangements: Other relatives  Functional History: Prior Function Level of Independence: Independent with assistive device(s) Comments: pt uses single crutch for stability.  Functional Status:  Mobility: Bed Mobility General bed mobility comments: pt sitting in recliner.  Transfers Overall transfer level: Needs assistance Equipment used: Rolling walker (2 wheeled) Transfers: Sit to/from Stand Sit to Stand: Min assist General transfer comment: cues for UE use and controlling descent to sitting.  Ambulation/Gait Ambulation/Gait assistance: Mod assist Ambulation Distance (Feet): 10 Feet Assistive device: Rolling walker (2 wheeled) Gait Pattern/deviations: Step-through pattern, Decreased stance time - left, Ataxic, Trunk flexed General Gait Details: pt with L lateral lean and drift. pt with ataxic movements in L LE and pt needs max cueing for safe technique. A with RW management as well.     ADL:    Cognition: Cognition Overall Cognitive Status: Within Functional Limits for tasks assessed Orientation Level: Oriented X4 Cognition Arousal/Alertness: Awake/alert Behavior During Therapy: WFL for tasks assessed/performed Overall Cognitive Status: Within Functional Limits for tasks assessed  Blood pressure 144/33, pulse 57, temperature 99.6 F (37.6 C), temperature source Oral, resp. rate 16, height 5' (1.524 m), weight 58.7 kg (129 lb 6.6 oz), SpO2 98 %. Physical Exam  Vitals reviewed. Constitutional: She is oriented to person, place, and time. She appears well-developed and well-nourished.  HENT:  Head: Normocephalic.  Right Ear: External ear normal.  Left Ear: External ear  normal.  Eyes: Conjunctivae and EOM are normal.  Neck: Normal range of motion. Neck supple. No thyromegaly present.  Cardiovascular: Normal rate and regular rhythm.  Respiratory: Effort normal and breath sounds normal. No respiratory distress.  GI: Soft. Bowel sounds are normal. She exhibits no distension.  Musculoskeletal: She exhibits tenderness. She exhibits no edema.  Neurological: She is alert and oriented to person, place, and time.  Alert and oriented 3. Follows commands.  Fair awareness of deficits. DTRs 3+ LUE/LLE Sensation intact to light touch Motor: RUE 4+/5 proximal to distal RLE 4+/5 proximal to distal LUE: 4/5 proximal to distal LLE hip flexion 4-/5 (previous hip surgery), ankle dorsi/plantar flexion 4/5  Skin: Skin is warm and dry.  Psychiatric: She has a normal mood and affect. Her behavior is normal. Judgment normal.     Lab Results Last 24 Hours    No results found for this or any previous visit (from the past 24 hour(s)).    Imaging Results (Last 48 hours)    Dg Shoulder Right  10/23/2015 CLINICAL DATA: Right shoulder pain after fall 1 day prior. EXAM: RIGHT SHOULDER - 2+ VIEW COMPARISON: None. FINDINGS: Soft tissue anchors overlie the lateral right humeral head. No fracture, dislocation or suspicious focal osseous lesion. Mild osteoarthritis in the right acromioclavicular joint. Moderate osteoarthritis in the right glenohumeral joint, most prominent inferiorly. IMPRESSION: 1. No fracture or dislocation in the right shoulder. 2. Mild right AC joint and moderate inferior right glenohumeral joint  osteoarthritis. Electronically Signed By: Ilona Sorrel M.D. On: 10/23/2015 14:29     Assessment/Plan: Diagnosis: Right ICH Labs and images independently reviewed. Records reviewed and summated above.  Stroke: Continue secondary stroke prophylaxis and Risk Factor Modification listed below:  Blood Pressure Management: Continue current medication with prn's  with permisive HTN per primary team PT/OT for mobility, ADL training  1. Does the need for close, 24 hr/day medical supervision in concert with the patient's rehab needs make it unreasonable for this patient to be served in a less intensive setting? Yes Co-Morbidities requiring supervision/potential complications: THA (ensure pain does not limit therapies), HTN (monitor and provide prns in accordance with increased physical exertion and pain), CHF (Monitor in accordance with increased physical activity and avoid UE resistance excercises), and Anxiety (ensure anxiety and resulting apprehension do not limit functional progress; consider prn medications if warranted), spinal stenosis (cont to monitor pain and avoid excessive extension based exercises).  2. Due to safety, skin/wound care, disease management, medication administration and patient education, does the patient require 24 hr/day rehab nursing? Yes 3. Does the patient require coordinated care of a physician, rehab nurse, PT (1.5-2 hrs/day, 5 days/week) and OT (1.5-2 hrs/day, 5 days/week) to address physical and functional deficits in the context of the above medical diagnosis(es)? Yes Addressing deficits in the following areas: balance, endurance, locomotion, strength, transferring, dressing, toileting and psychosocial support 4. Can the patient actively participate in an intensive therapy program of at least 3 hrs of therapy per day at least 5 days per week? Yes 5. The potential for patient to make measurable gains while on inpatient rehab is excellent 6. Anticipated functional outcomes upon discharge from inpatient rehab are modified independent with PT, modified independent with OT, n/a with SLP. 7. Estimated rehab length of stay to reach the above functional goals is: 16-19 days. 8. Does the patient have adequate social supports and living environment to accommodate these discharge functional goals? Yes 9. Anticipated D/C setting:  Home 10. Anticipated post D/C treatments: HH therapy and Home excercise program 11. Overall Rehab/Functional Prognosis: good  RECOMMENDATIONS: This patient's condition is appropriate for continued rehabilitative care in the following setting: CIR Patient has agreed to participate in recommended program. Yes Note that insurance prior authorization may be required for reimbursement for recommended care.  Comment: Rehab Admissions Coordinator to follow up.  Delice Lesch, MD 10/25/2015       Revision History     Date/Time User Provider Type Action   10/25/2015 1:05 PM Ankit Lorie Phenix, MD Physician Addend   10/25/2015 12:45 PM Ankit Lorie Phenix, MD Physician Addend   10/25/2015 12:12 PM Ankit Lorie Phenix, MD Physician Addend   10/25/2015 12:09 PM Ankit Lorie Phenix, MD Physician Sign   10/25/2015 7:33 AM Cathlyn Parsons, PA-C Physician Assistant [place]   View Details Report       Routing History     Date/Time From To Method   10/25/2015 1:05 PM Ankit Lorie Phenix, MD Ankit Lorie Phenix, MD In [facility]

## 2015-10-27 NOTE — Progress Notes (Signed)
Occupational Therapy Treatment Patient Details Name: Theresa Harris MRN: [id number] DOB: [birth year] Today's Date: 10/27/2015    History of present illness pt presents R Thalamic and Internal Capsule ICH.  pt with hx of L hip fx and THA, HTN, CHF, and Anxiety.     OT comments  Pt tolerated session well with family present in the room. Pt was highly distracted during mobility and required mod verbal cues to attend to task. Pt with L lateral leans through session with functional tasks that pt is unable to self correct. Verbal cues needed for proper technique, safety awareness, and hand placement.  Follow Up Recommendations  CIR;Supervision/Assistance - 24 hour    Equipment Recommendations  Other (comment) (TBD in next venue of care)    Recommendations for Other Services      Precautions / Restrictions Precautions Precautions: Fall Restrictions Weight Bearing Restrictions: No       Mobility Bed Mobility               General bed mobility comments: Pt seated in recliner chair up entering the room.   Transfers Overall transfer level: Needs assistance Equipment used: Rolling walker (2 wheeled) Transfers: Sit to/from Stand Sit to Stand: Min assist Stand pivot transfers: Min assist       General transfer comment: pt very distracted and requiring cues for safety and proper technique for hand placement.     Balance Overall balance assessment: History of Falls;Needs assistance Sitting-balance support: Feet supported;No upper extremity supported Sitting balance-Leahy Scale: Fair   Postural control: Left lateral lean Standing balance support: No upper extremity supported;During functional activity Standing balance-Leahy Scale: Poor                     ADL Overall ADL's : Needs assistance/impaired     Grooming: Wash/dry hands;Minimal assistance;Standing Grooming Details (indicate cue type and reason): Pt requires min A when standing but pt having L lateral  lean.                 Toilet Transfer: Minimal assistance;Squat-pivot;Comfort height toilet;RW;Cueing for safety;Cueing for sequencing   Toileting- Clothing Manipulation and Hygiene: Minimal assistance;Sit to/from stand;Cueing for sequencing;Cueing for safety;With adaptive equipment Toileting - Clothing Manipulation Details (indicate cue type and reason): standing with RW for clothing management with min assist for balance for L lateral lean. Vcs for proper technique and safety.                        Cognition   Behavior During Therapy: Impulsive Overall Cognitive Status: Within Functional Limits for tasks assessed                                    Pertinent Vitals/ Pain       Pain Assessment: No/denies pain  Home Living   Living Arrangements: Alone     Home Access: Other (comment) (3 steps to enter through front door, ramped back entrance)                 Bathroom Accessibility: No (not with a walker )          Lives With: Alone    Prior Functioning/Environment          Comments: pt reports using a walker in the home   Frequency Min 2X/week     Progress Toward Goals  OT Goals(current goals can now be  found in the care plan section)  Progress towards OT goals: Progressing toward goals     Plan Discharge plan remains appropriate       End of Session Equipment Utilized During Treatment: Rolling walker;Other (comment) (elevated toilet seat)   Activity Tolerance Patient tolerated treatment well   Patient Left in chair;with call bell/phone within reach;with chair alarm set           Time: 5993-5701 OT Time Calculation (min): 14 min  Charges: OT General Charges $OT Visit: 1 Procedure OT Treatments $Self Care/Home Management : 8-22 mins  Phineas Semen, MS, OTR/L 10/27/2015, 3:47 PM

## 2015-10-27 NOTE — PMR Pre-admission (Signed)
PMR Admission Coordinator Pre-Admission Assessment  Patient: Theresa Harris is an 81 y.o., female MRN: [id number] DOB: 1927-01-11 Height: 5' (152.4 cm) Weight: 58.7 kg (129 lb 6.6 oz)              Insurance Information HMO:    PPO: yes     PCP:      IPA:      80/20:      OTHER:  PRIMARY: [employer]       Policy#: [id number]      Subscriber: Self CM Name: Esaw Grandchild      Phone#: [phone]     Fax#: [phone] Pre-Cert#: [id number]  x7 days, update 11/01/16    Employer: Retired Benefits:  Phone #: [phone] option 2     Name: Candelaria Stagers. Date: 11/27/14     Deduct: $0      Out of Pocket Max: $3400.00     Life Max: unlimited  CIR: $275 days 1-7, $0 days 8-90      SNF: $0 days 1-20, $150 days 21-100 Outpatient: PT/OT/SLP     Co-Pay: $15 Home Health:  No visit limit    Co-Pay: $25 DME: 80%     Co-Pay: 20% Providers: in network  Emergency Contact Information Contact Information    Name Relation Home Work [place] R Daughter [phone]     Darlyne Russian [phone]       Current Medical History  Patient Admitting Diagnosis: Right ICH  History of Present Illness: Theresa Harris is a 81 y.o. right handed female with history of hypertension, diastolic congestive heart failure, spinal stenosis. Patient lives alone in [place]. One story home with ramp and used a crutch and walker prior to admission due to history of multiple left hip surgeries. Presented to [facility] 10/22/2015 after a fall from a step stool landing on her left side. Blood pressure 188/45. CT of the head showed a small parenchymal hemorrhage involving right thalamus internal capsule measuring 14 x 9 mm with extension of hemorrhage into the right lateral ventricle without hydrocephalus. Incidental finding of 15 mm right frontal meningioma. She was transferred to [facility] for further evaluation. Patient did not receive TPA secondary to [place].  Echocardiogram with ejection fraction 65% no wall motion abnormalities.. Carotid Dopplers with right 40-59% ICA stenosis. Neurology consulted with the advise of close monitoring of blood pressure. Tolerating a regular consistency diet. Physical and occupational therapy evaluations completed 10/23/2015 with recommendations of physical medicine rehabilitation consult. Patient was admitted for a comprehensive rehabilitation program  NIH Total: 3   Past Medical History  Past Medical History  Diagnosis Date  . Spinal stenosis   . CHF (congestive heart failure) ([place])   . Anxiety   . HTN (hypertension)     Family History  family history is not on file.  Prior Rehab/Hospitalizations: Patient reports going to rehab ~2 years ago for knee and shoulder problems.  Has the patient had major surgery during 100 days prior to admission? No  Current Medications   Current facility-administered medications:  .   stroke: mapping our early stages of recovery book, , Does not apply, Once, Wallie Char .  acetaminophen (TYLENOL) tablet 650 mg, 650 mg, Oral, Q4H PRN, 650 mg at 10/26/15 2348 **OR** acetaminophen (TYLENOL) suppository 650 mg, 650 mg, Rectal, Q4H PRN, Wallie Char .  alum & mag hydroxide-simeth (MAALOX/MYLANTA) 200-200-20 MG/5ML suspension 30 mL, 30 mL, Oral, Q4H PRN, Early Chars Rinehuls, PA-C, 30  mL at 10/24/15 1656 .  calcium-vitamin D (OSCAL WITH D) 500-200 MG-UNIT per tablet 1 tablet, 1 tablet, Oral, BID, Leotis Pain, MD, 1 tablet at 10/26/15 2140 .  estradiol (ESTRACE) tablet 0.5 mg, 0.5 mg, Oral, Daily, Leotis Pain, MD, 0.5 mg at 10/27/15 0949 .  hydrochlorothiazide (MICROZIDE) capsule 12.5 mg, 12.5 mg, Oral, Daily, Melvenia Beam, MD, 12.5 mg at 10/26/15 0914 .  hydrocortisone cream 1 %, , Topical, TID, Donzetta Starch, NP .  labetalol (NORMODYNE,TRANDATE) injection 10-40 mg, 10-40 mg, Intravenous, Q10 min PRN, Wallie Char, 20 mg at 10/23/15 1623 .  loperamide (IMODIUM)  capsule 2 mg, 2 mg, Oral, PRN, Donzetta Starch, NP .  LORazepam (ATIVAN) tablet 1 mg, 1 mg, Oral, TID PRN, Leotis Pain, MD, 1 mg at 10/27/15 1023 .  magnesium oxide (MAG-OX) tablet 400 mg, 400 mg, Oral, Daily, Leotis Pain, MD, 400 mg at 10/27/15 0949 .  metoprolol succinate (TOPROL-XL) 24 hr tablet 25 mg, 25 mg, Oral, BID, Leotis Pain, MD, 25 mg at 10/26/15 2141 .  ondansetron (ZOFRAN) injection 4 mg, 4 mg, Intravenous, Q6H PRN, Melvenia Beam, MD, 4 mg at 10/24/15 0823 .  pantoprazole (PROTONIX) EC tablet 40 mg, 40 mg, Oral, BID, Donzetta Starch, NP, 40 mg at 10/27/15 1032  Patients Current Diet: Diet regular Room service appropriate?: Yes; Fluid consistency:: Thin  Precautions / Restrictions Precautions Precautions: Fall Restrictions Weight Bearing Restrictions: No   Has the patient had 2 or more falls or a fall with injury in the past year? No, except for fall that occurred with this admission   Prior Activity Level Limited Community (1-2x/wk): Patient reports that she drove to her appointments, ran errands and attended church weekly, but would try to do as many errands as possible when she went out so that she was not out of the house daily.    Home Assistive Devices / Equipment Home Assistive Devices/Equipment: Crutches Home Equipment: Hand held shower head, Tub bench, Bedside commode, Walker - 2 wheels, Cane - single point, Under arm crutch  Prior Device Use: Indicate devices/aids used by the patient prior to current illness, exacerbation or injury? Walker  Prior Functional Level Prior Function Level of Independence: Independent with assistive device(s) Comments: pt reports using a walker in the home  Self Care: Did the patient need help bathing, dressing, using the toilet or eating?  Independent with assistive device   Indoor Mobility: Did the patient need assistance with walking from room to room (with or without device)? Independent with assistive device     Stairs: Did the patient need assistance with internal or external stairs (with or without device)? Independent with assistive device  Functional Cognition: Did the patient need help planning regular tasks such as shopping or remembering to take medications? Independent  Current Functional Level Cognition  Overall Cognitive Status: Within Functional Limits for tasks assessed Orientation Level: Oriented X4    Extremity Assessment (includes Sensation/Coordination)  Upper Extremity Assessment: LUE deficits/detail, RUE deficits/detail RUE Deficits / Details: h/o frozen shoulder LUE Deficits / Details: decreased shoulder strength (3/5), elbow to distally WFL; pt with complaints of tingling sensation in left hand; decreased coordination   Lower Extremity Assessment: Defer to PT evaluation LLE Deficits / Details: Strength grossly 4-/5, decreased proprioception, ataxic movement.   LLE Sensation: decreased proprioception LLE Coordination: decreased fine motor, decreased gross motor    ADLs  Overall ADL's : Needs assistance/impaired Eating/Feeding: Set up, Sitting Grooming: Set up, Sitting Upper Body Bathing: Minimal assitance,  Sitting Lower Body Bathing: Moderate assistance, Sit to/from stand Upper Body Dressing : Minimal assistance, Sitting Lower Body Dressing: Moderate assistance, Sit to/from stand Toilet Transfer: Moderate assistance, Stand-pivot, BSC Toileting- Clothing Manipulation and Hygiene: Minimal assistance, Sitting/lateral lean Functional mobility during ADLs: Moderate assistance General ADL Comments: Pt with left lateral lean with dynamic sitting and standing activities.     Mobility  Overal bed mobility: Needs Assistance Bed Mobility: Supine to Sit Supine to sit: Min assist General bed mobility comments: Pt able to transition to EOB with heavy use of bed rails, and min assist for trunk support as she elevated shoulders to full sitting position. Pt swaying in sitting  initially, requiring assist for safety, and was able to scoot out to EOB so feet were supported, with extra time.     Transfers  Overall transfer level: Needs assistance Equipment used: Rolling walker (2 wheeled) Transfers: Sit to/from Stand Sit to Stand: Min assist Stand pivot transfers: Min assist General transfer comment: VC's for hand placement on seated surface for safety. Pt was able to power-up to full standing with assist for balance/support initially.     Ambulation / Gait / Stairs / Wheelchair Mobility  Ambulation/Gait Ambulation/Gait assistance: Min assist, +2 safety/equipment Ambulation Distance (Feet): 15 Feet Assistive device: Rolling walker (2 wheeled) Gait Pattern/deviations: Step-through pattern, Decreased stride length, Trunk flexed, Ataxic, Decreased stance time - left General Gait Details: Pt with L lateral lean and drift. Assist to keep R side of walker on ground as well as to manage walker direction during turns, and frequent cues required for safe technique.  Gait velocity: Decreased Gait velocity interpretation: Below normal speed for age/gender    Posture / Balance Dynamic Sitting Balance Sitting balance - Comments: Occasional assist required to maintain sitting balance initially Balance Overall balance assessment: Needs assistance, History of Falls Sitting-balance support: No upper extremity supported, Feet supported Sitting balance-Leahy Scale: Fair Sitting balance - Comments: Occasional assist required to maintain sitting balance initially Postural control: Left lateral lean Standing balance support: No upper extremity supported, During functional activity Standing balance-Leahy Scale: Poor    Special needs/care consideration BiPAP/CPAP n/a CPM n/a Continuous Drip IV n/a Dialysis n/a  Life Vest n/a Oxygen n/a Special Bed n/a Trach Size n/a  Wound Vac (area) n/a  Skin n/a  Bowel mgmt: 11/30 urgency with some incontinence when she cannot make it  there in time  Bladder mgmt: 11/30 urgency  Diabetic mgmt HgbA1c - 5.9    Previous Home Environment Living Arrangements: Alone  Lives With: Alone Available Help at Discharge:  (none, "unless I hire someone.Marland KitchenMarland KitchenI have long term health insurance") Type of Home: House Home Layout: One level Home Access: Other (comment) (3 steps to enter through front door, ramped back entrance) Bathroom Shower/Tub: Tub/shower unit, Architectural technologist: Handicapped height Bathroom Accessibility: No (not with a walker ) Home Care Services: No  Discharge Living Setting Plans for Discharge Living Setting: Patient's home Type of Home at Discharge: House Discharge Home Layout: One level Discharge Home Access: Stairs to enter, Ramped entrance (3 steps to enter in front and ramped back entrance ) Entrance Stairs-Number of Steps: 3 in the front Discharge Bathroom Shower/Tub: Tub/shower unit, Curtain Discharge Bathroom Toilet: Handicapped height Discharge Bathroom Accessibility: No (not with a walker) Does the patient have any problems obtaining your medications?: No  Social/Family/Support Systems Patient Roles: Parent, Other (Comment) (sister) Anticipated Caregiver: n/a patient with Mod I goals Discharge Plan Discussed with Primary Caregiver: No (n/a)  Goals/Additional Needs Patient/Family Goal  for Rehab: Mod I OT/PT Expected length of stay: 16-19 days Cultural Considerations: [employer]  Dietary Needs: none Equipment Needs: TBA Special Service Needs: None Pt/Family Agrees to Admission and willing to participate: Yes Program Orientation Provided & Reviewed with Pt/Caregiver Including Roles  & Responsibilities: Yes  Decrease burden of Care through IP rehab admission: not anticipated at this time   Possible need for SNF placement upon discharge: not anticipated at this time; patient with predicted Mod I goals  Patient Condition: This patient's medical and functional status has changed since the consult  dated: 10/25/15 in which the Rehabilitation Physician determined and documented that the patient's condition is appropriate for intensive rehabilitative care in an inpatient rehabilitation facility. See "History of Present Illness" (above) for medical update. Functional changes are: currently requiring Min assist +2 for 15 feet . Patient's medical and functional status update has been discussed with the Rehabilitation physician and patient remains appropriate for inpatient rehabilitation. Will admit to inpatient rehab today.  Preadmission Screen Completed By:  Gunnar Fusi, 10/27/2015 3:22 PM ______________________________________________________________________   Discussed status with Dr. Letta Pate on 10/27/15 at 1522 and received telephone approval for admission today.  Admission Coordinator:  Gunnar Fusi, time 1522/Date 10/27/15

## 2015-10-27 NOTE — Progress Notes (Signed)
Come in to transfer patient to the new unit, but patient still eating at this time. wil transfer as soon as patient completes

## 2015-10-27 NOTE — H&P (Signed)
Physical Medicine and Rehabilitation Admission H&P   Chief complaint: Hip pain  HPI: Theresa Harris is a 81 y.o. right handed female with history of hypertension, diastolic congestive heart failure, spinal stenosis. Patient lives alone in [place]. One story home with ramp and used a crutch and walker prior to admission due to history of multiple left hip surgeries. Presented to [facility] 10/22/2015 after a fall from a step stool landing on her left side. Blood pressure 188/45. CT of the head showed a small parenchymal hemorrhage involving right thalamus internal capsule measuring 14 x 9 mm with extension of hemorrhage into the right lateral ventricle without hydrocephalus. Incidental finding of 15 mm right frontal meningioma. She was transferred to [facility] for further evaluation. Patient did not receive TPA secondary to [place]. Echocardiogram with ejection fraction 65% no wall motion abnormalities.. Carotid Dopplers with right 40-59% ICA stenosis. Neurology consulted with the advise of close monitoring of blood pressure. Tolerating a regular consistency diet. Physical and occupational therapy evaluations completed 10/23/2015 with recommendations of physical medicine rehabilitation consult. Patient was admitted for a comprehensive rehabilitation program  ROS Constitutional: Negative for fever and chills.  HENT: Negative for hearing loss.   Occasional headaches  Eyes: Negative for blurred vision and double vision.  Respiratory: Negative for cough.   Occasional shortness of breath with exertion  Cardiovascular: Positive for leg swelling. Negative for chest pain and palpitations.  Gastrointestinal: Positive for diarrhea. Negative for nausea and vomiting.  Genitourinary: Negative for dysuria and hematuria.  Musculoskeletal: Positive for myalgias, back pain, joint pain and falls.  Skin: Negative for rash.  Neurological: Positive  for dizziness and tingling. Negative for seizures and weakness.   Spinal stenosis  Psychiatric/Behavioral:   Anxiety  All other systems reviewed and are negative   Past Medical History  Diagnosis Date  . Spinal stenosis   . CHF (congestive heart failure) ([place])   . Anxiety   . HTN (hypertension)    Past Surgical History  Procedure Laterality Date  . Knee surgery      right  . Abdominal hysterectomy    . Hip surgery    . Cholecystectomy    . Carpal tunnel release     History reviewed. No pertinent family history. Social History:  reports that she has quit smoking. She does not have any smokeless tobacco history on file. She reports that she does not drink alcohol or use illicit drugs. Allergies:  Allergies  Allergen Reactions  . Morphine And Related Nausea And Vomiting   Medications Prior to Admission  Medication Sig Dispense Refill  . acetaminophen (TYLENOL) 500 MG tablet Take 500 mg by mouth every 6 (six) hours as needed for mild pain.    Marland Kitchen b complex vitamins tablet Take 1 tablet by mouth daily.    . Calcium Carbonate-Vitamin D (CALCIUM 600+D) 600-400 MG-UNIT tablet Take 1 tablet by mouth 2 (two) times daily.    Marland Kitchen estradiol (ESTRACE) 1 MG tablet Take 0.5 mg by mouth daily.     . hydrochlorothiazide (HYDRODIURIL) 25 MG tablet Take 25 mg by mouth daily.    . magnesium oxide (MAG-OX) 400 MG tablet Take 400 mg by mouth daily.    . metoprolol succinate (TOPROL-XL) 25 MG 24 hr tablet Take 25 mg by mouth 2 (two) times daily.    Marland Kitchen omeprazole (PRILOSEC) 20 MG capsule Take 20 mg by mouth 2 (two) times daily.    Marland Kitchen oxazepam (SERAX) 15 MG capsule Take 15  mg by mouth 3 (three) times daily as needed for sleep.    Marland Kitchen tiZANidine (ZANAFLEX) 2 MG tablet Take 2 mg by mouth every 8 (eight) hours as needed for muscle spasms.    Marland Kitchen triamcinolone (NASACORT ALLERGY 24HR) 55 MCG/ACT  AERO nasal inhaler Place 2 sprays into the nose daily as needed (for rhinitis).      Home: Home Living Family/patient expects to be discharged to:: Inpatient rehab Living Arrangements: Alone Available Help at Discharge: (none, "unless I hire someone.Marland KitchenMarland KitchenI have long term health insurance") Type of Home: House Home Access: Ramped entrance Home Layout: One level Bathroom Shower/Tub: Tub/shower unit, Architectural technologist: Handicapped height Home Equipment: Hand held shower head, Tub bench, Bedside commode, Walker - 2 wheels, Cane - single point, Crutches  Functional History: Prior Function Level of Independence: Independent with assistive device(s) Comments: Pt uses RW in house, pt uses single crutch for stability in community.   Functional Status:  Mobility: Bed Mobility Overal bed mobility: Needs Assistance Bed Mobility: Supine to Sit Supine to sit: Min assist General bed mobility comments: Pt able to transition to EOB with heavy use of bed rails, and min assist for trunk support as she elevated shoulders to full sitting position. Pt swaying in sitting initially, requiring assist for safety, and was able to scoot out to EOB so feet were supported, with extra time.  Transfers Overall transfer level: Needs assistance Equipment used: None Transfers: Sit to/from Stand, Stand Pivot Transfers Sit to Stand: Min assist Stand pivot transfers: Min assist General transfer comment: No AD used, pt performed sit to/from stand and stand pivot transfer from [facility] to recliner Ambulation/Gait Ambulation/Gait assistance: Min assist, +2 physical assistance, +2 safety/equipment Ambulation Distance (Feet): 15 Feet Assistive device: Rolling walker (2 wheeled) Gait Pattern/deviations: Step-through pattern, Decreased stride length, Trunk flexed, Ataxic, Decreased weight shift to left, Decreased stance time - left General Gait Details: Pt with L lateral lean and drift. Assist to keep R side of  walker on ground as well as to manage walker direction during turns, and frequent cues required for safe technique.  Gait velocity: Decreased Gait velocity interpretation: Below normal speed for age/gender    ADL: ADL Overall ADL's : Needs assistance/impaired Eating/Feeding: Set up, Sitting Grooming: Set up, Sitting Upper Body Bathing: Minimal assitance, Sitting Lower Body Bathing: Moderate assistance, Sit to/from stand Upper Body Dressing : Minimal assistance, Sitting Lower Body Dressing: Moderate assistance, Sit to/from stand Toilet Transfer: Moderate assistance, Stand-pivot, BSC Toileting- Clothing Manipulation and Hygiene: Minimal assistance, Sitting/lateral lean Functional mobility during ADLs: Moderate assistance General ADL Comments: Pt with left lateral lean with dynamic sitting and standing activities.   Cognition: Cognition Overall Cognitive Status: Within Functional Limits for tasks assessed Orientation Level: Oriented X4 Cognition Arousal/Alertness: Awake/alert Behavior During Therapy: WFL for tasks assessed/performed Overall Cognitive Status: Within Functional Limits for tasks assessed  Physical Exam: Blood pressure 125/42, pulse 74, temperature 99.2 F (37.3 C), temperature source Oral, resp. rate 18, height 5' (1.524 m), weight 58.7 kg (129 lb 6.6 oz), SpO2 96 %. Physical Exam Constitutional: She is oriented to person, place, and time. She appears well-developed and well-nourished.  HENT:  Head: Normocephalic.  Right Ear: External ear normal.  Left Ear: External ear normal.  Eyes: Conjunctivae and EOM are normal.  Neck: Normal range of motion. Neck supple. No thyromegaly present.  Cardiovascular: Normal rate and regular rhythm.  Respiratory: Effort normal and breath sounds normal. No respiratory distress.  GI: Soft. Bowel sounds are normal. She exhibits no  distension.  Musculoskeletal: She exhibits tenderness. She exhibits no edema. Reduced Left  hip  ROM- mainly flexion restricted,  Valgus deformities bilateral knees Neurological: She is alert and oriented to person, place, and time.  Alert and oriented 3. Follows commands.  Fair awareness of deficits. DTRs 3+ LUE/LLE Sensation intact to light touch Motor: RUE 4+/5 proximal to distal RLE 4+/5 proximal to distal LUE: 4/5 proximal to distal LLE hip flexion 4-/5 (previous hip surgery), ankle dorsi/plantar flexion 4/5  Skin: Skin is warm and dry.  Psychiatric: She has a normal mood and affect. Her behavior is normal. Judgment normal    Lab Results Last 48 Hours    Results for orders placed or performed during the hospital encounter of 10/22/15 (from the past 48 hour(s))  Hemoglobin A1c Status: Abnormal   Collection Time: 10/25/15 4:34 AM  Result Value Ref Range   Hgb A1c MFr Bld 5.9 (H) 4.8 - 5.6 %    Comment: (NOTE)  Pre-diabetes: 5.7 - 6.4  Diabetes: >6.4  Glycemic control for adults with diabetes: <7.0    Mean Plasma Glucose 123 mg/dL    Comment: (NOTE) Performed At: [facility] [place], [place] [id number] Lindon Romp MD OI:[id number]       Imaging Results (Last 48 hours)    Mr Virgel Paling Wo Contrast  10/25/2015 CLINICAL DATA: 81 year old hypertensive female with acute hemorrhage. Left-sided weakness and slurred speech. Subsequent encounter. EXAM: MRI HEAD WITHOUT CONTRAST MRA HEAD WITHOUT CONTRAST TECHNIQUE: Multiplanar, multiecho pulse sequences of the brain and surrounding structures were obtained without intravenous contrast. Angiographic images of the head were obtained using MRA technique without contrast. COMPARISON: 10/22/2015 head CT. No comparison brain MR. FINDINGS: MRI HEAD FINDINGS 1.2 x 1 x 0.8 cm hemorrhage posterior limb right internal capsule with surrounding mild vasogenic edema and local mass effect. No acute thrombotic infarct. Right frontal lobe 1.5 cm meningioma with  slight deformity adjacent gyri and without vasogenic edema. Global mild atrophy without hydrocephalus. Mild small vessel disease type changes. Degenerative changes C4-5 and C5-6. Mild transverse ligament hypertrophy. Cervical medullary junction unremarkable. Post lens replacement otherwise orbital structures unremarkable. Pineal and pituitary region unremarkable. MRA HEAD FINDINGS Cavernous and supraclinoid segment internal carotid artery atherosclerotic type changes with mild to slightly moderate irregularity and narrowing. Additionally there is a 4 mm aneurysm arising from the lateral aspect of the right internal carotid artery cavernous segment. Mild to moderate narrowing M1 segment middle cerebral artery bilaterally. Middle cerebral artery branch vessel mild irregularity and narrowing. Mild narrowing A1 and A2 segment anterior cerebral artery bilaterally. No abnormal vessels extend into posterior limb right internal capsule hemorrhage. Slight irregularity distal vertebral arteries without significant narrowing. Slight irregularity basilar artery with very mild narrowing proximal aspect. Poor delineation left anterior inferior cerebral artery. Fetal contribution to posterior cerebral arteries more notable on the left. Poor delineation majority right posterior cerebral artery. Bulbous atypical appearance of the basilar tip on the left from which vessel rises without discrete separate saccular aneurysm. IMPRESSION: MRI HEAD 1.2 x 1 x 0.8 cm hemorrhage posterior limb right internal capsule with surrounding mild vasogenic edema and local mass effect. No acute thrombotic infarct. Right frontal lobe 1.5 cm meningioma with slight deformity adjacent gyri and without vasogenic edema. MRA HEAD Cavernous and supraclinoid segment internal carotid artery atherosclerotic type changes with mild to slightly moderate irregularity and narrowing. Additionally there is a 4 mm aneurysm arising from the lateral aspect of the right  internal carotid artery cavernous segment.  Mild to moderate narrowing M1 segment middle cerebral artery bilaterally. Middle cerebral artery branch vessel mild irregularity and narrowing. Mild narrowing A1 and A2 segment anterior cerebral artery bilaterally. No abnormal vessels extend into posterior limb right internal capsule hemorrhage. Slight irregularity distal vertebral arteries without significant narrowing. Slight irregularity basilar artery with very mild narrowing proximal aspect. Poor delineation left anterior inferior cerebral artery. Poor delineation majority right posterior cerebral artery. Bulbous atypical appearance of the basilar tip on the left from which vessel rises without discrete separate saccular aneurysm. Electronically Signed By: Genia Del M.D. On: 10/25/2015 14:43   Mr Brain Wo Contrast  10/25/2015 CLINICAL DATA: 81 year old hypertensive female with acute hemorrhage. Left-sided weakness and slurred speech. Subsequent encounter. EXAM: MRI HEAD WITHOUT CONTRAST MRA HEAD WITHOUT CONTRAST TECHNIQUE: Multiplanar, multiecho pulse sequences of the brain and surrounding structures were obtained without intravenous contrast. Angiographic images of the head were obtained using MRA technique without contrast. COMPARISON: 10/22/2015 head CT. No comparison brain MR. FINDINGS: MRI HEAD FINDINGS 1.2 x 1 x 0.8 cm hemorrhage posterior limb right internal capsule with surrounding mild vasogenic edema and local mass effect. No acute thrombotic infarct. Right frontal lobe 1.5 cm meningioma with slight deformity adjacent gyri and without vasogenic edema. Global mild atrophy without hydrocephalus. Mild small vessel disease type changes. Degenerative changes C4-5 and C5-6. Mild transverse ligament hypertrophy. Cervical medullary junction unremarkable. Post lens replacement otherwise orbital structures unremarkable. Pineal and pituitary region unremarkable. MRA HEAD FINDINGS Cavernous and  supraclinoid segment internal carotid artery atherosclerotic type changes with mild to slightly moderate irregularity and narrowing. Additionally there is a 4 mm aneurysm arising from the lateral aspect of the right internal carotid artery cavernous segment. Mild to moderate narrowing M1 segment middle cerebral artery bilaterally. Middle cerebral artery branch vessel mild irregularity and narrowing. Mild narrowing A1 and A2 segment anterior cerebral artery bilaterally. No abnormal vessels extend into posterior limb right internal capsule hemorrhage. Slight irregularity distal vertebral arteries without significant narrowing. Slight irregularity basilar artery with very mild narrowing proximal aspect. Poor delineation left anterior inferior cerebral artery. Fetal contribution to posterior cerebral arteries more notable on the left. Poor delineation majority right posterior cerebral artery. Bulbous atypical appearance of the basilar tip on the left from which vessel rises without discrete separate saccular aneurysm. IMPRESSION: MRI HEAD 1.2 x 1 x 0.8 cm hemorrhage posterior limb right internal capsule with surrounding mild vasogenic edema and local mass effect. No acute thrombotic infarct. Right frontal lobe 1.5 cm meningioma with slight deformity adjacent gyri and without vasogenic edema. MRA HEAD Cavernous and supraclinoid segment internal carotid artery atherosclerotic type changes with mild to slightly moderate irregularity and narrowing. Additionally there is a 4 mm aneurysm arising from the lateral aspect of the right internal carotid artery cavernous segment. Mild to moderate narrowing M1 segment middle cerebral artery bilaterally. Middle cerebral artery branch vessel mild irregularity and narrowing. Mild narrowing A1 and A2 segment anterior cerebral artery bilaterally. No abnormal vessels extend into posterior limb right internal capsule hemorrhage. Slight irregularity distal vertebral arteries without  significant narrowing. Slight irregularity basilar artery with very mild narrowing proximal aspect. Poor delineation left anterior inferior cerebral artery. Poor delineation majority right posterior cerebral artery. Bulbous atypical appearance of the basilar tip on the left from which vessel rises without discrete separate saccular aneurysm. Electronically Signed By: Genia Del M.D. On: 10/25/2015 14:43        Medical Problem List and Plan: 1. 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 DVT Prophylaxis/Anticoagulation: SCDs. Monitor for any signs of DVT 3. Pain Management: Tylenol as needed 4. Mood/anxiety. Ativan 1 mg 3 times a day as needed. Provide emotional support 5. Neuropsych: This patient is capable of making decisions on her own behalf. 6. Skin/Wound Care: Routine skin checks 7. Fluids/Electrolytes/Nutrition: Routine I&O with follow-up chemistries 8. Hypertension. Hydrochlorothiazide 12.5 mg daily, Toprol-XL 25 mg twice a day. Monitor with increased mobility 9. Diastolic congestive heart failure. Monitor for any signs of fluid overload 10. Multiple left hip surgeries. Patient used a crutch and walker prior to admission   Post Admission Physician Evaluation: 1. Functional deficits secondary to Left hemisenory deficits and sensory ataxia due to R thalamic ICH. 2. Patient is admitted to receive collaborative, interdisciplinary care between the physiatrist, rehab nursing staff, and therapy team. 3. Patient's level of medical complexity and substantial therapy needs in context of that medical necessity cannot be provided at a lesser intensity of care such as a SNF. 4. Patient has experienced substantial functional loss from his/her baseline which was documented above under the "Functional History" and "Functional Status" headings. Judging by the patient's diagnosis, physical exam, and functional history, the patient has potential for  functional progress which will result in measurable gains while on inpatient rehab. These gains will be of substantial and practical use upon discharge in facilitating mobility and self-care at the household level. 5. Physiatrist will provide 24 hour management of medical needs as well as oversight of the therapy plan/treatment and provide guidance as appropriate regarding the interaction of the two. 6. 24 hour rehab nursing will assist with bladder management, bowel management, safety, skin/wound care, disease management, medication administration, pain management and patient education and help integrate therapy concepts, techniques,education, etc. 7. PT will assess and treat for/with: pre gait, gait training, endurance , safety, equipment, neuromuscular re education. Goals are: Sup. 8. OT will assess and treat for/with: ADLs, Cognitive perceptual skills, Neuromuscular re education, safety, endurance, equipment. Goals are: Sup/modI. Therapy may proceed with showering this patient. 9. SLP will assess and treat for/with: med management, problem solving, attention and concentration. Goals are: Mod I med management, direct check book use. 10. Case Management and Social Worker will assess and treat for psychological issues and discharge planning. 11. Team conference will be held weekly to assess progress toward goals and to determine barriers to discharge. 12. Patient will receive at least 3 hours of therapy per day at least 5 days per week. 13. ELOS: 14-18d  14. Prognosis: excellent     Charlett Blake M.D. [facility] FAAPM&R (Sports Med, Neuromuscular Med) Diplomate Am Board of Electrodiagnostic Med  10/26/2015

## 2015-10-27 NOTE — Progress Notes (Signed)
Patient being d/c to inpatient rehab, report called to the receiving RN.

## 2015-10-28 ENCOUNTER — Inpatient Hospital Stay (HOSPITAL_COMMUNITY): Payer: PPO | Admitting: Physical Therapy

## 2015-10-28 ENCOUNTER — Inpatient Hospital Stay (HOSPITAL_COMMUNITY): Payer: PPO | Admitting: Occupational Therapy

## 2015-10-28 DIAGNOSIS — I5032 Chronic diastolic (congestive) heart failure: Secondary | ICD-10-CM

## 2015-10-28 DIAGNOSIS — I610 Nontraumatic intracerebral hemorrhage in hemisphere, subcortical: Secondary | ICD-10-CM

## 2015-10-28 DIAGNOSIS — I10 Essential (primary) hypertension: Secondary | ICD-10-CM

## 2015-10-28 LAB — COMPREHENSIVE METABOLIC PANEL
ALBUMIN: 3 g/dL — AB (ref 3.5–5.0)
ALK PHOS: 43 U/L (ref 38–126)
ALT: 25 U/L (ref 14–54)
AST: 28 U/L (ref 15–41)
Anion gap: 7 (ref 5–15)
BUN: 13 mg/dL (ref 6–20)
CALCIUM: 8.8 mg/dL — AB (ref 8.9–10.3)
CHLORIDE: 100 mmol/L — AB (ref 101–111)
CO2: 28 mmol/L (ref 22–32)
CREATININE: 0.78 mg/dL (ref 0.44–1.00)
GFR calc non Af Amer: >60 mL/min (ref >60)
GLUCOSE: 98 mg/dL (ref 65–99)
Potassium: 3.5 mmol/L (ref 3.5–5.1)
SODIUM: 135 mmol/L (ref 135–145)
Total Bilirubin: 0.7 mg/dL (ref 0.3–1.2)
Total Protein: 5.7 g/dL — ABNORMAL LOW (ref 6.5–8.1)

## 2015-10-28 LAB — CBC WITH DIFFERENTIAL/PLATELET
BASOS ABS: 0 10*3/uL (ref 0.0–0.1)
Basophils Relative: 1 %
EOS ABS: 0.1 10*3/uL (ref 0.0–0.7)
EOS PCT: 2 %
HCT: 33.9 % — ABNORMAL LOW (ref 36.0–46.0)
HEMOGLOBIN: 11.7 g/dL — AB (ref 12.0–15.0)
LYMPHS ABS: 2.1 10*3/uL (ref 0.7–4.0)
Lymphocytes Relative: 36 %
MCH: 30.9 pg (ref 26.0–34.0)
MCHC: 34.5 g/dL (ref 30.0–36.0)
MCV: 89.4 fL (ref 78.0–100.0)
Monocytes Absolute: 0.9 10*3/uL (ref 0.1–1.0)
Monocytes Relative: 16 %
NEUTROS PCT: 45 %
Neutro Abs: 2.6 10*3/uL (ref 1.7–7.7)
PLATELETS: 185 10*3/uL (ref 150–400)
RBC: 3.79 MIL/uL — AB (ref 3.87–5.11)
RDW: 12.9 % (ref 11.5–15.5)
WBC: 5.8 10*3/uL (ref 4.0–10.5)

## 2015-10-28 MED ORDER — DICLOFENAC SODIUM 1 % TD GEL
2.0000 g | Freq: Three times a day (TID) | TRANSDERMAL | Status: DC
  Administered 2015-10-28 – 2015-11-06 (×19): 2 g via TOPICAL
  Filled 2015-10-28: qty 100

## 2015-10-28 MED ORDER — TIZANIDINE HCL 2 MG PO TABS
2.0000 mg | ORAL_TABLET | Freq: Three times a day (TID) | ORAL | Status: DC | PRN
Start: 2015-10-28 — End: 2015-11-02
  Administered 2015-10-30 – 2015-11-01 (×4): 2 mg via ORAL
  Filled 2015-10-28 (×4): qty 1

## 2015-10-28 NOTE — Progress Notes (Signed)
Pt refused SCDs. Explained to pt the reason for the order, but pt c/o legs spasms.Lynett Fish, RN

## 2015-10-28 NOTE — Evaluation (Signed)
Physical Therapy Assessment and Plan  Patient Details  Name: CELICA KOTOWSKI MRN: [id number] Date of Birth: 25-Jan-1927  PT Diagnosis: Abnormal posture, Abnormality of gait, Hemiparesis non-dominant and Muscle weakness Rehab Potential: Good ELOS: 7-9 days   Today's Date: 10/28/2015 PT Individual Time: 1010-1105 PT Individual Time Calculation (min): 55 min    Problem List:  Patient Active Problem List   Diagnosis Date Noted  . Chronic diastolic congestive heart failure ([place]) 10/28/2015  . Essential hypertension 10/28/2015  . Ataxia   . Sensation alteration, late effect of cerebrovascular disease   . Aneurysm, cerebral, nonruptured 10/26/2015  . Meningioma ([place]) 10/26/2015  . ICH (intracerebral hemorrhage) ([place]) 10/22/2015    Past Medical History:  Past Medical History  Diagnosis Date  . Spinal stenosis   . CHF (congestive heart failure) ([place])   . Anxiety   . HTN (hypertension)    Past Surgical History:  Past Surgical History  Procedure Laterality Date  . Knee surgery      right  . Abdominal hysterectomy    . Hip surgery    . Cholecystectomy    . Carpal tunnel release      Assessment & Plan Clinical Impression:Shayla C Gwynn is a 81 y.o. right handed female with history of hypertension, diastolic congestive heart failure, spinal stenosis. Patient lives alone in [place] . One story home with ramp and used a crutch and walker prior to admission due to history of multiple left hip surgeries.  Presented to [facility] 10/22/2015 after a fall from a step stool landing on her left side. Blood pressure 188/45. CT of the head showed a small parenchymal hemorrhage involving right thalamus internal capsule measuring 14 x 9 mm with extension of hemorrhage into the right lateral ventricle without hydrocephalus. Incidental finding of 15 mm right frontal meningioma. She was transferred to [facility] for further evaluation. Patient did not  receive TPA secondary to [place]. Echocardiogram with ejection fraction 65% no wall motion abnormalities.. Carotid Dopplers with right 40-59% ICA stenosis. Neurology consulted with the advise of close monitoring of blood pressure. Tolerating a regular consistency diet. Physical and occupational therapy evaluations completed 10/23/2015 with recommendations of physical medicine rehabilitation consult. Patient was admitted for a comprehensive rehabilitation program.  Patient transferred to CIR on 10/27/2015 .   Patient currently requires min with mobility secondary to muscle weakness, decreased coordination and decreased standing balance, decreased postural control and decreased balance strategies.  Prior to hospitalization, patient was modified independent  with mobility and lived with Alone in a House home.  Home access is 2Ramped entrance, Stairs to enter (stairs to enter through front, ramp to enter through back).  Patient will benefit from skilled PT intervention to maximize safe functional mobility and minimize fall risk for planned discharge home alone.  Anticipate patient will benefit from follow up [place] at discharge.  PT - End of Session Activity Tolerance: Tolerates 30+ min activity with multiple rests Endurance Deficit: Yes PT Assessment Rehab Potential (ACUTE/IP ONLY): Good Barriers to Discharge: Decreased caregiver support PT Patient demonstrates impairments in the following area(s): Balance;Endurance;Motor;Safety PT Transfers Functional Problem(s): Bed Mobility;Bed to Chair;Car;Furniture PT Locomotion Functional Problem(s): Ambulation;Stairs PT Plan PT Intensity: Minimum of 1-2 x/day ,45 to 90 minutes PT Frequency: 5 out of 7 days PT Duration Estimated Length of Stay: 7-9 days PT Treatment/Interventions: Ambulation/gait training;Balance/vestibular training;Community reintegration;Discharge planning;DME/adaptive equipment instruction;Functional mobility training;Patient/family  education;Neuromuscular re-education;Therapeutic Exercise;Therapeutic Activities;UE/LE Strength taining/ROM;Stair training;UE/LE Coordination activities PT Transfers Anticipated Outcome(s): mod I PT  Locomotion Anticipated Outcome(s): mod I PT Recommendation Follow Up Recommendations: Home health PT Patient destination: Home Equipment Recommended: None recommended by PT  Skilled Therapeutic Intervention Session 1: Pt received resting in w/c and agreeable to therapy session. Skilled intervention began after initial evaluation.  PT instructed patient in functional transfers from a variety of surfaces/heights, gait with RW, w/c propulsion, and balance.  Pt demonstrates decreased safety awareness during ambulatory transfers and requires min assist and verbal cues for sequencing and approximation to surface before starting to sit down.  Pt requiring overall min assist for transfers and gait.  Pt returned to room and positioned in bed at end of session with call bell in reach and needs met.   Session 2: Pt received resting in bed and agreeable to therapy session, voicing need to toilet.  Pt transitioned supine>sit>stand EOB with RW and supervision with verbal cues for hand placement.  Pt amb to bathroom and toileted with overall steady assist for balance, see function tab for details.  PT instructed patient in amb x100' with RW and steady assist for safety.  Pt returned to room at end of session and positioned supine in bed with call bell in reach and needs met.   PT Evaluation Precautions/Restrictions Precautions Precautions: Fall Restrictions Weight Bearing Restrictions: No Pain Pain Assessment Pain Assessment: No/denies pain Home Living/Prior Functioning Home Living Available Help at Discharge: Other (Comment) (possibility of hiring an aid through [place]) Type of Home: House Home Access: Ramped entrance;Stairs to enter (stairs to enter through front, ramp to enter through back) Entrance  Stairs-Number of Steps: 2 Entrance Stairs-Rails: Left Home Layout: One level Bathroom Shower/Tub: Tub/shower unit;Curtain (with tub bench)  Lives With: Alone Prior Function Level of Independence: Requires assistive device for independence  Able to Take Stairs?: Yes Driving: Yes Vocation: Retired  Associate Professor Overall Cognitive Status: Within Functional Limits for tasks assessed Arousal/Alertness: Awake/alert Orientation Level: Oriented X4 Attention: Selective Selective Attention: Appears intact Memory: Impaired Memory Impairment: Decreased recall of new information Awareness: Appears intact Problem Solving: Appears intact Safety/Judgment: Appears intact Sensation Sensation Light Touch: Appears Intact Coordination Gross Motor Movements are Fluid and Coordinated: No Fine Motor Movements are Fluid and Coordinated: No Coordination and Movement Description: decreased speed bilaterally, decreased accuracy L>R Heel Shin Test: decreased ROM on L due to multiple hip surgeries, WFL on R Motor  Motor Motor: Hemiplegia Motor - Skilled Clinical Observations: impaired balance  Mobility Bed Mobility Bed Mobility: Rolling Right;Rolling Left;Sit to Supine Rolling Right: 6: Modified independent (Device/Increase time) Rolling Left: 6: Modified independent (Device/Increase time) Sit to Supine: 5: Supervision Sit to Supine - Details: Verbal cues for technique;Verbal cues for sequencing Transfers Transfers: Yes Sit to Stand: 4: Min assist Sit to Stand Details: Tactile cues for posture;Verbal cues for safe use of DME/AE;Verbal cues for technique Stand to Sit: 4: Min assist Stand to Sit Details (indicate cue type and reason): Verbal cues for sequencing;Verbal cues for technique;Manual facilitation for placement Locomotion  Ambulation Ambulation: Yes Ambulation/Gait Assistance: 4: Min assist Ambulation Distance (Feet): 60 Feet Assistive device: Rolling walker Ambulation/Gait Assistance  Details: Verbal cues for sequencing;Verbal cues for technique;Verbal cues for gait pattern Gait Gait: Yes Gait Pattern: Impaired Gait Pattern: Step-to pattern;Trendelenburg;Left hip hike;Right hip hike Stairs / Additional Locomotion Stairs: No Wheelchair Mobility Wheelchair Mobility: Yes Wheelchair Assistance: 5: Careers information officer: Both upper extremities Wheelchair Parts Management: Needs assistance Distance: 150  Trunk/Postural Assessment  Cervical Assessment Cervical Assessment: Within Functional Limits Thoracic Assessment Thoracic Assessment:  (forward flexed, but  able to correct to neutral posture with verbal cues) Lumbar Assessment Lumbar Assessment:  (able to correct to neutral posture in sitting with verbal cues) Postural Control Postural Control: Deficits on evaluation Righting Reactions: delayed righting reactions  Balance Balance Balance Assessed: Yes Static Sitting Balance Static Sitting - Balance Support: No upper extremity supported Static Sitting - Level of Assistance: 5: Stand by assistance Dynamic Sitting Balance Dynamic Sitting - Balance Support: Left upper extremity supported;Right upper extremity supported;Feet supported Dynamic Sitting - Level of Assistance: 5: Stand by assistance Dynamic Sitting - Balance Activities: Forward lean/weight shifting Static Standing Balance Static Standing - Balance Support: During functional activity Static Standing - Level of Assistance: 4: Min assist (standing at sink to wash hands) Dynamic Standing Balance Dynamic Standing - Balance Support: During functional activity Dynamic Standing - Level of Assistance: 4: Min assist Dynamic Standing - Balance Activities: Lateral lean/weight shifting;Forward lean/weight shifting;Reaching for objects;Reaching across midline Extremity Assessment    RLE Assessment RLE Assessment: Exceptions to [facility] RLE Strength RLE Overall Strength Comments: hip flexion 4/5, knee  flexion 4/5, knee extension 4+/5, DF 4+/5, PF 4/5 LLE Assessment LLE Assessment: Exceptions to [facility] LLE Strength LLE Overall Strength Comments: hip flexion 3-/5, knee flexion 3+/5, knee extension 4+/5, DF 4+/5, PF 4/5   See Function Navigator for Current Functional Status.   Refer to Care Plan for Long Term Goals  Recommendations for other services: None  Discharge Criteria: Patient will be discharged from PT if patient refuses treatment 3 consecutive times without medical reason, if treatment goals not met, if there is a change in medical status, if patient makes no progress towards goals or if patient is discharged from hospital.  The above assessment, treatment plan, treatment alternatives and goals were discussed and mutually agreed upon: by patient  Earnest Conroy Penven-Crew 10/28/2015, 12:33 PM

## 2015-10-28 NOTE — Progress Notes (Signed)
Patient information reviewed and entered into eRehab system by Carlyle Mcelrath, RN, CRRN, PPS Coordinator.  Information including medical coding and functional independence measure will be reviewed and updated through discharge.     Per nursing patient was given "Data Collection Information Summary for Patients in Inpatient Rehabilitation Facilities with attached "Privacy Act Statement-Health Care Records" upon admission.  

## 2015-10-28 NOTE — Progress Notes (Addendum)
Physical Therapy Session Note  Patient Details  Name: Theresa Harris MRN: [id number] Date of Birth: 1926/12/23  Today's Date: 10/28/2015 PT Individual Time: 1130-1215 and 1400-1430 PT Individual Time Calculation (min): 45 min and 30 min   Skilled Therapeutic Interventions/Progress Updates:   Treatment 1: Session focused on functional transfers and stair training. Patient received in bed, performed stand pivot transfer to wheelchair after donning shoes with steady assist. Patient performed simulated car transfer to sedan height using RW with min A for sit <> stand and assist to lift LLE into car. Patient required mod A for initial sit >stand from wheelchair using RW. Patient negotiated up/down 12 stairs ( 8 3", 4 6" stairs) using 2 rails ascending forwards and descending with bilateral UE support on R rail laterally due to history of knee OA/pain and verbal cues for safe step-to sequencing. After returning to room, patient required max cues to utilize smart phone to perform desired task of logging onto social media site and updating picture. Patient left sitting in wheelchair with all needs within reach.   Treatment 2: Patient sitting edge of bed, reporting that she would like a higher wheelchair seat as her current wheelchair is lower to ground and hurts her shoulders with sit > stand. Therapist retrieved wheelchair with higher floor to seat height with cushion and adjusted leg rests for optimal sitting tolerance. Patient ambulated to and from wheelchair using RW with min guard and performed sit <> stand to wheelchair with supervision. Patient reporting improved comfort and sitting tolerance in new wheelchair stating, "I could take a nap in this!" Patient requested to lie down and returned to bed, transferred sit > supine and repositioned higher in bed using bed rail with supervision. Patient left supine in bed with bed alarm on and needs within reach.   Therapy Documentation Precautions:   Precautions Precautions: Fall Restrictions Weight Bearing Restrictions: No Pain: Pain Assessment Pain Assessment: No/denies pain   See Function Navigator for Current Functional Status.   Therapy/Group: Individual Therapy  Laretta Alstrom 10/28/2015, 12:21 PM

## 2015-10-28 NOTE — Evaluation (Signed)
Occupational Therapy Assessment and Plan  Patient Details  Name: Theresa Harris MRN: [id number] Date of Birth: 04/16/1927  OT Diagnosis: hemiplegia affecting non-dominant side and muscle weakness (generalized) Rehab Potential: Rehab Potential (ACUTE ONLY): Good ELOS: 7 days   Today's Date: 10/28/2015 OT Individual Time: 0800-0930 OT Individual Time Calculation (min): 90 min     Problem List:  Patient Active Problem List   Diagnosis Date Noted  . Traumatic intracerebral hemorrhage ([place]) 10/27/2015  . Ataxia   . Sensation alteration, late effect of cerebrovascular disease   . Aneurysm, cerebral, nonruptured 10/26/2015  . Meningioma ([place]) 10/26/2015  . ICH (intracerebral hemorrhage) ([place]) 10/22/2015    Past Medical History:  Past Medical History  Diagnosis Date  . Spinal stenosis   . CHF (congestive heart failure) ([place])   . Anxiety   . HTN (hypertension)    Past Surgical History:  Past Surgical History  Procedure Laterality Date  . Knee surgery      right  . Abdominal hysterectomy    . Hip surgery    . Cholecystectomy    . Carpal tunnel release      Assessment & Plan Clinical Impression: Patient is a 81 y.o. year old right handed female with history of hypertension, diastolic congestive heart failure, spinal stenosis. Patient lives alone in [place]. One story home with ramp and used a crutch and walker prior to admission due to history of multiple left hip surgeries. Presented to [facility] 10/22/2015 after a fall from a step stool landing on her left side. Blood pressure 188/45. CT of the head showed a small parenchymal hemorrhage involving right thalamus internal capsule measuring 14 x 9 mm with extension of hemorrhage into the right lateral ventricle without hydrocephalus. Incidental finding of 15 mm right frontal meningioma. She was transferred to [facility] for further evaluation. Patient did not receive TPA secondary to  [place]. Echocardiogram with ejection fraction 65% no wall motion abnormalities.. Carotid Dopplers with right 40-59% ICA stenosis. Neurology consulted with the advise of close monitoring of blood pressure.   Patient transferred to CIR on 10/27/2015 .    Patient currently requires min to mod A  with basic self-care skills and basic mobility secondary to muscle weakness, decreased cardiorespiratoy endurance, decreased coordination and decr sensation, decreased memory and decreased standing balance, decreased postural control, decreased balance strategies and difficulty maintaining precautions.  Prior to hospitalization, patient could complete ADL with modified independent .  Patient will benefit from skilled intervention to decrease level of assist with basic self-care skills and increase independence with basic self-care skills prior to discharge home with care partner.  Anticipate patient will require intermittent supervision and follow up home health.  OT - End of Session Activity Tolerance: Tolerates 10 - 20 min activity with multiple rests Endurance Deficit: Yes OT Assessment Rehab Potential (ACUTE ONLY): Good Barriers to Discharge: Decreased caregiver support OT Patient demonstrates impairments in the following area(s): Balance;Edema;Endurance;Motor;Pain;Safety;Sensory OT Basic ADL's Functional Problem(s): Grooming;Dressing;Bathing;Toileting OT Advanced ADL's Functional Problem(s): Simple Meal Preparation OT Transfers Functional Problem(s): Toilet;Tub/Shower OT Additional Impairment(s): Fuctional Use of Upper Extremity OT Plan OT Intensity: Minimum of 1-2 x/day, 45 to 90 minutes OT Frequency: 5 out of 7 days OT Duration/Estimated Length of Stay: 7 days OT Treatment/Interventions: Medical illustrator training;Community reintegration;Discharge planning;Functional mobility training;Patient/family education;Psychosocial support;Therapeutic Activities;UE/LE Strength taining/ROM;Therapeutic  Exercise;Self Care/advanced ADL retraining;Neuromuscular re-education;Disease mangement/prevention;Pain management;UE/LE Coordination activities;Cognitive remediation/compensation;DME/adaptive equipment instruction OT Self Feeding Anticipated Outcome(s): n/a OT Basic Self-Care Anticipated Outcome(s): mod I  OT Toileting Anticipated Outcome(s): mod I  OT Bathroom Transfers Anticipated Outcome(s): mod I  OT Recommendation Patient destination: Home Follow Up Recommendations: Home health OT Equipment Recommended: To be determined   Skilled Therapeutic Intervention 1:1 OT eval initiated with OT goals, purpose and role discussed. Self care retraining at shower level with focus on sit to stand, standing balance, transfer training, short distance functional ambulation with RW, toilet transfers, shower stall transfer (zero entry), activity tolerance/ endurance etc. Pt continues to be a high fall risk and has reported multiple falls at home. Pt presents with decr grip strength and dropping objects in left hand when performing grooming tasks.   OT Evaluation Precautions/Restrictions  Precautions Precautions: Fall Restrictions Weight Bearing Restrictions: No General Chart Reviewed: Yes Family/Caregiver Present: No   Pain  "i always have aches and pains" but able to continue with treatment  Home Living/[place] expects to be discharged to:: Private residence Living Arrangements: Alone Type of Home: House Home Access: Ramped entrance (aide to the back of the house) Home Layout: One level Bathroom Shower/Tub: Tub/shower unit, Curtain (with tub bench)  Lives With: Alone ADL  see functional navigator  Vision/Perception  Vision- History Baseline Vision/History: Wears glasses Wears Glasses: At all times Patient Visual Report: No change from baseline Vision- Assessment Vision Assessment?: No apparent visual deficits  Cognition Arousal/Alertness:  Awake/alert Orientation Level: Person;Place;Situation Person: Oriented Place: Oriented Situation: Oriented Year: 2016 Month: December Day of Week: Incorrect (reported Monday) Memory: Impaired Memory Impairment: Decreased recall of new information Immediate Memory Recall: Sock;Blue Memory Recall: Blue (1/3) Memory Recall Blue: Without Cue Attention: Selective Selective Attention: Appears intact Awareness: Appears intact Problem Solving: Appears intact Safety/Judgment: Appears intact Sensation Sensation Light Touch: Impaired Detail Light Touch Impaired Details: Impaired LUE;Impaired LLE Coordination Gross Motor Movements are Fluid and Coordinated: No Fine Motor Movements are Fluid and Coordinated: No Coordination and Movement Description: slower on left Motor  Motor Motor: Hemiplegia Motor - Skilled Clinical Observations: decr balance Mobility    sit to stand with min A with more than reasonable amt of time; transfers with min to mod A without device depending  on RA pain Trunk/Postural Assessment  Cervical Assessment Cervical Assessment: Within Functional Limits Thoracic Assessment Thoracic Assessment:  (forward flexed) Lumbar Assessment Lumbar Assessment:  (posterior pelvic tilt) Postural Control Postural Control: Deficits on evaluation (leans to the left) Righting Reactions: delayed  Balance Balance Balance Assessed: Yes Static Sitting Balance Static Sitting - Level of Assistance: 5: Stand by assistance Dynamic Sitting Balance Dynamic Sitting - Level of Assistance: 4: Min Insurance risk surveyor Standing - Balance Support: During functional activity Static Standing - Level of Assistance: 4: Min assist Dynamic Standing Balance Dynamic Standing - Balance Support: During functional activity Dynamic Standing - Level of Assistance: 4: Min assist;3: Mod assist Extremity/Trunk Assessment RUE Assessment RUE Assessment: Within Functional Limits LUE  Assessment LUE Assessment: Exceptions to [facility] (3+/5 in shoulder (limited by pain)) LUE Strength LUE Overall Strength: Deficits LUE Overall Strength Comments: reports dropping things- related to strength and dec sensation (reports hand is nubb   See Function Navigator for Current Functional Status.   Refer to Care Plan for Long Term Goals  Recommendations for other services: None  Discharge Criteria: Patient will be discharged from OT if patient refuses treatment 3 consecutive times without medical reason, if treatment goals not met, if there is a change in medical status, if patient makes no progress towards goals or if patient is discharged from hospital.  The above assessment, treatment plan, treatment alternatives and goals were discussed and mutually agreed upon: by patient  Nicoletta Ba 10/28/2015, 9:11 AM

## 2015-10-28 NOTE — Progress Notes (Signed)
[facility] PHYSICAL MEDICINE & REHABILITATION     PROGRESS NOTE    Subjective/Complaints: Very restless last night. Couldn't get to sleep. Both knees bothered her. Also felt she had restless legs. Some anxiety/sob   ROS: Pt denies fever, rash/itching, headache, blurred or double vision, nausea, vomiting, abdominal pain, diarrhea, chest pain, , palpitations, dysuria, dizziness, neck or back pain, bleeding,   or depression   Objective: Vital Signs: Blood pressure 134/48, pulse 62, temperature 98.1 F (36.7 C), temperature source Oral, resp. rate 18, height 5' (1.524 m), weight 57.9 kg (127 lb 10.3 oz), SpO2 97 %. No results found.  Recent Labs  10/28/15 0450  WBC 5.8  HGB 11.7*  HCT 33.9*  PLT 185    Recent Labs  10/28/15 0450  NA 135  K 3.5  CL 100*  GLUCOSE 98  BUN 13  CREATININE 0.78  CALCIUM 8.8*   CBG (last 3)  No results for input(s): GLUCAP in the last 72 hours.  Wt Readings from Last 3 Encounters:  10/27/15 57.9 kg (127 lb 10.3 oz)  10/22/15 58.7 kg (129 lb 6.6 oz)  10/22/15 57.153 kg (126 lb)    Physical Exam:  Constitutional: She is oriented to person, place, and time. She appears well-developed and well-nourished.  HENT:  Head: Normocephalic.  Right Ear: External ear normal.  Left Ear: External ear normal.  Eyes: Conjunctivae and EOM are normal.  Neck: Normal range of motion. Neck supple. No thyromegaly present.  Cardiovascular: Normal rate and regular rhythm.  Respiratory: Effort normal and breath sounds normal. No respiratory distress.  GI: Soft. Bowel sounds are normal. She exhibits no distension.  Musculoskeletal: She exhibits tenderness. She exhibits no edema. Reduced Left hip ROM- mainly flexion restricted, Valgus deformities bilateral knees Neurological: She is alert and oriented to person, place, and time.  Alert and oriented 3. Follows commands.  Fair awareness of deficits. Very verbose but appropriate DTRs 3+  LUE/LLE Sensation intact to light touch in all 4's. Motor: RUE 4+/5 proximal to distal RLE 4+/5 HF, KE and ADF/APF LUE: 4/5 delt, bicep,tricep, wrist, hand---mild PD, decreased FMC LLE hip flexion 4-/5 (previous hip surgery), ankle dorsi/plantar flexion 4/5  Skin: Skin is warm and dry.  Psychiatric: She has a normal mood and affect. Her behavior is normal. Judgment normal  Assessment/Plan: 1. 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 Physiatrist is providing close team supervision and 24 hour management of active medical problems listed below. Physiatrist and rehab team continue to assess barriers to discharge/monitor patient progress toward functional and medical goals.  Function:  Bathing Bathing position   Position: Shower  Bathing parts Body parts bathed by patient: Right arm, Left arm, Chest, Abdomen, Front perineal area, Buttocks, Right upper leg, Left upper leg Body parts bathed by helper: Right lower leg, Left lower leg, Back  Bathing assist Assist Level: Touching or steadying assistance(Pt > 75%)      Upper Body Dressing/Undressing Upper body dressing   What is the patient wearing?: Pull over shirt/dress (dress)     Pull over shirt/dress - Perfomed by patient: Thread/unthread right sleeve, Thread/unthread left sleeve, Put head through opening, Pull shirt over trunk          Upper body assist Assist Level: Touching or steadying assistance(Pt > 75%)      Lower Body Dressing/Undressing Lower body dressing  What is the patient wearing?: Underwear, Shoes   Underwear - Performed by helper: Thread/unthread right underwear leg, Thread/unthread left underwear leg, Pull underwear up/down               Shoes - Performed by helper: Don/doff right shoe, Don/doff  left shoe, Fasten right, Fasten left          Lower body assist Assist for lower body dressing: Touching or steadying assistance (Pt > 75%)      Toileting Toileting   Toileting steps completed by patient: Adjust clothing prior to toileting, Performs perineal hygiene, Adjust clothing after toileting      Toileting assist Assist level: Touching or steadying assistance (Pt.75%)   Transfers Chair/bed transfer   Chair/bed transfer method: Stand pivot Chair/bed transfer assist level: Moderate assist (Pt 50 - 74%/lift or lower)       Locomotion Ambulation           Wheelchair          Cognition Comprehension Comprehension assist level: Understands complex 90% of the time/cues 10% of the time  Expression Expression assist level: Expresses complex 90% of the time/cues < 10% of the time  Social Interaction Social Interaction assist level: Interacts appropriately with others with medication or extra time (anti-anxiety, antidepressant).  Problem Solving Problem solving assist level: Solves complex problems: With extra time  Memory Memory assist level: Recognizes or recalls 75 - 89% of the time/requires cueing 10 - 24% of the time   Medical Problem List and Plan: 1. Left hemiparesis and balance deficits secondary to hypertensive right thalamic and internal capsule hemorrhages 2. DVT Prophylaxis/Anticoagulation: SCDs. Encourage mobility 3. Pain Management: Tylenol as needed  -resume zanaflex for spasms as she takes at home  -add voltaren gel for bilateral knees (OA) 4. Mood/anxiety. Ativan 1 mg 3 times a day as needed (in place of non-formulary serax she uses at home)  Provide emotional support 5. Neuropsych: This patient is capable of making decisions on her own behalf. 6. Skin/Wound Care: Routine skin checks 7. Fluids/Electrolytes/Nutrition: Routine I&O with follow-up chemistries 8. Hypertension. Hydrochlorothiazide 12.5 mg daily, Toprol-XL 25 mg twice a day. Monitor with  increased mobility 9. Diastolic congestive heart failure. Monitor for any signs of fluid overload. Check i's and o's as well as daily weights 10. Multiple left hip surgeries. Patient used a crutch and walker prior to admission 11. GERD: protonix   LOS (Days) 1 A FACE TO FACE EVALUATION WAS PERFORMED  Anvika Gashi T 10/28/2015 10:02 AM

## 2015-10-29 ENCOUNTER — Inpatient Hospital Stay (HOSPITAL_COMMUNITY): Payer: PPO

## 2015-10-29 ENCOUNTER — Inpatient Hospital Stay (HOSPITAL_COMMUNITY): Payer: PPO | Admitting: Physical Therapy

## 2015-10-29 NOTE — Progress Notes (Signed)
Physical Therapy Session Note  Patient Details  Name: Theresa Harris MRN: [id number] Date of Birth: 1927-09-02  Today's Date: 10/29/2015 PT Individual Time: 1030-1200 and 1400-1500 PT Individual Time Calculation (min): 90 min and 60 min   Short Term Goals: Week 1:  PT Short Term Goal 1 (Week 1): =LTGs due to ELOS  Skilled Therapeutic Interventions/Progress Updates:    Session 1:  Pt received resting in bed with daughter present, agreeable to therapy session.  Session focus on overall endurance, LE strengthening, and gait training with LRAD.  Pt transitioned supine>sit with no cues and increased time with bed rails and sit>stand with RW and supervision.  Pt amb to restroom and performed toileting tasks with overall supervision, see function tab for details.  Pt amb x170' to therapy gym with one short seated rest break utilizing RW with steady assist for balance as L knee was buckling frequently.  PT instructed patient in 10 min on Nustep at level 1 for knee ROM and overall endurance.  PT administered BERG balance scale.  Patient demonstrates 100% fall risk as noted by score of 21/56 on Berg Balance Scale.  PT initiated pt education on results and interpretation of falls risk.  Pt states that she usually does fine at home with her single crutch.  Education to continue.  PT instructed patient in ambulation with [facility] x5' and rollator x40' per patient request.  Mod assist required for amb with [facility] with L knee buckling and decreased UE support to compensate.  Min assist for amb with rollator with verbal cues for safe speed and upright posture.  Pt returned to room in w/c total assist at end of session for energy conservation and time management.  Pt positioned sitting in w/c with tray table in front for lunch and left with call bell in reach and needs met.   Session 2: Pt received resting in bed and agreeable to therapy session.  Session focus on gait training, dynamic standing balance, and endurance.  Pt  transitioned supine>sit mod I using bed rail; pt reports that she has a bed rail at home that she can use to pull up on.  Sit>stand with rollator and supervision with verbal cues for locking brakes.  Amb x3 feet with rollator with pt noting it was moving too quickly, so switched to standard front wheeled walker.  Pt amb x170' to therapy gym with one rest break using RW and steady assist with verbal cues for equal step length, walker positioning, and attention to LLE.   PT instructed pt in dynamic standing balance activity reaching for and throwing horse shoes, then using reacher to pick horse shoes from floor.  Pt attempted stand/pivot from mat>w/c placing only one hand on RW, PT provided verbal and visual cues for correct sequencing and hand placement and pt performed transfer with supervision. Pt propelled w/c with BUEs x100' and returned to room remaining distance total assist for energy conservation. Pt positioned seated EOB with bed alarm activated, call bell in reach, and needs met.    Therapy Documentation Precautions:  Precautions Precautions: Fall Restrictions Weight Bearing Restrictions: No  Balance: Balance Balance Assessed: Yes Standardized Balance Assessment Standardized Balance Assessment: Berg Balance Test Berg Balance Test Sit to Stand: Able to stand  independently using hands Standing Unsupported: Able to stand 2 minutes with supervision Sitting with Back Unsupported but Feet Supported on Floor or Stool: Able to sit safely and securely 2 minutes Stand to Sit: Controls descent by using hands Transfers: Able to  transfer safely, definite need of hands Standing Unsupported with Eyes Closed: Able to stand 10 seconds with supervision Standing Ubsupported with Feet Together: Needs help to attain position and unable to hold for 15 seconds From Standing, Reach Forward with Outstretched Arm: Loses balance while trying/requires external support From Standing Position, Pick up Object from  Floor: Unable to try/needs assist to keep balance From Standing Position, Turn to Look Behind Over each Shoulder: Turn sideways only but maintains balance Turn 360 Degrees: Needs assistance while turning Standing Unsupported, Alternately Place Feet on Step/Stool: Needs assistance to keep from falling or unable to try Standing Unsupported, One Foot in Front: Loses balance while stepping or standing Standing on One Leg: Unable to try or needs assist to prevent fall Total Score: 21   See Function Navigator for Current Functional Status.   Therapy/Group: Individual Therapy  Earnest Conroy Penven-Crew 10/29/2015, 11:36 AM

## 2015-10-29 NOTE — Care Management Note (Signed)
[facility] Individual Statement of Services  Patient Name:  Theresa Harris  Date:  10/29/2015  Welcome to the [facility].  Our goal is to provide you with an individualized program based on your diagnosis and situation, designed to meet your specific needs.  With this comprehensive rehabilitation program, you will be expected to participate in at least 3 hours of rehabilitation therapies Monday-Friday, with modified therapy programming on the weekends.  Your rehabilitation program will include the following services:  Physical Therapy (PT), Occupational Therapy (OT), Speech Therapy (ST), 24 hour per day rehabilitation nursing, Therapeutic Recreaction (TR), Neuropsychology, Case Management (Social Worker) and Rehabilitation Medicine  Weekly team conferences will be held on Tuesdays to discuss your progress.  Your Social Worker will talk with you frequently to get your input and to update you on team discussions.  Team conferences with you and your family in attendance may also be held.  Expected length of stay: 7-9 days  Overall anticipated outcome: modified independent  Depending on your progress and recovery, your program may change. Your Social Worker will coordinate services and will keep you informed of any changes. Your Social Worker's name and contact numbers are listed  below.  The following services may also be recommended but are not provided by the [facility] will be made to provide these services after discharge if needed.  Arrangements include referral to agencies that provide these services.  Your insurance has been verified to be:  [employer] Your primary doctor is:  Dr. Frazier Richards  Pertinent information will be shared with your doctor and your insurance company.  Social Worker:   [place], [place] or ([phone]   Information discussed with and copy given to patient by: Lennart Pall, 10/29/2015, 11:03 AM

## 2015-10-29 NOTE — Progress Notes (Addendum)
[facility] PHYSICAL MEDICINE & REHABILITATION     PROGRESS NOTE    Subjective/Complaints: Didn't sleep well again. Received her ativan and tizanidine but they "dont' work for sleep".  Uses tylenol/benadryl at home  ROS: Pt denies fever, rash/itching, headache, blurred or double vision, nausea, vomiting, abdominal pain, diarrhea, chest pain, , palpitations, dysuria, dizziness, neck or back pain, bleeding,   or depression   Objective: Vital Signs: Blood pressure 118/34, pulse 60, temperature 98.3 F (36.8 C), temperature source Oral, resp. rate 18, height 5' (1.524 m), weight 57.9 kg (127 lb 10.3 oz), SpO2 99 %. No results found.  Recent Labs  10/28/15 0450  WBC 5.8  HGB 11.7*  HCT 33.9*  PLT 185    Recent Labs  10/28/15 0450  NA 135  K 3.5  CL 100*  GLUCOSE 98  BUN 13  CREATININE 0.78  CALCIUM 8.8*   CBG (last 3)  No results for input(s): GLUCAP in the last 72 hours.  Wt Readings from Last 3 Encounters:  10/27/15 57.9 kg (127 lb 10.3 oz)  10/22/15 58.7 kg (129 lb 6.6 oz)  10/22/15 57.153 kg (126 lb)    Physical Exam:  Constitutional: She is oriented to person, place, and time. She appears well-developed and well-nourished.  HENT:  Head: Normocephalic.  Right Ear: External ear normal.  Left Ear: External ear normal.  Eyes: Conjunctivae and EOM are normal.  Neck: Normal range of motion. Neck supple. No thyromegaly present.  Cardiovascular: Normal rate and regular rhythm.  Respiratory: Effort normal and breath sounds normal. No respiratory distress.  GI: Soft. Bowel sounds are normal. She exhibits no distension.  Musculoskeletal: She exhibits tenderness. She exhibits no edema. Reduced Left hip ROM- mainly flexion restricted, Valgus deformities bilateral knees Neurological: She is alert and oriented to person, place, and time.  Alert and oriented 3. Follows commands.  Fair awareness of deficits. Very verbose, tangential at times. DTRs 3+  LUE/LLE Decreased LT/pain left arm/leg. Motor: RUE 4+/5 proximal to distal RLE 4+/5 HF, KE and ADF/APF LUE: 4/5 delt, bicep,tricep, wrist, hand---mild PD, decreased FMC LLE hip flexion 4-/5 (previous hip surgery), ankle dorsi/plantar flexion 4/5  Skin: Skin is warm and dry.  Psychiatric: She has a normal mood and affect. Her behavior is normal. Judgment normal  Assessment/Plan: 1. 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 Physiatrist is providing close team supervision and 24 hour management of active medical problems listed below. Physiatrist and rehab team continue to assess barriers to discharge/monitor patient progress toward functional and medical goals.  Function:  Bathing Bathing position   Position: Shower  Bathing parts Body parts bathed by patient: Right arm, Left arm, Chest, Abdomen, Front perineal area, Buttocks, Right upper leg, Left upper leg Body parts bathed by helper: Right lower leg, Left lower leg, Back  Bathing assist Assist Level: Touching or steadying assistance(Pt > 75%)      Upper Body Dressing/Undressing Upper body dressing   What is the patient wearing?: Bra, Pull over shirt/dress Bra - Perfomed by patient: Thread/unthread right bra strap, Thread/unthread left bra strap, Hook/unhook bra (pull down sports bra)   Pull over shirt/dress - Perfomed by patient: Thread/unthread right sleeve, Thread/unthread left sleeve, Put head through opening, Pull shirt over trunk          Upper body assist Assist Level: Touching or steadying assistance(Pt >  75%)      Lower Body Dressing/Undressing Lower body dressing   What is the patient wearing?: Underwear, Pants, Non-skid slipper socks   Underwear - Performed by helper: Thread/unthread right underwear leg,  Thread/unthread left underwear leg, Pull underwear up/down   Pants- Performed by helper: Thread/unthread right pants leg, Thread/unthread left pants leg, Pull pants up/down   Non-skid slipper socks- Performed by helper: Don/doff right sock, Don/doff left sock       Shoes - Performed by helper: Don/doff right shoe, Don/doff left shoe, Fasten right, Fasten left          Lower body assist Assist for lower body dressing: Touching or steadying assistance (Pt > 75%)      Toileting Toileting   Toileting steps completed by patient: Adjust clothing prior to toileting, Performs perineal hygiene, Adjust clothing after toileting      Toileting assist Assist level: Touching or steadying assistance (Pt.75%)   Transfers Chair/bed transfer   Chair/bed transfer method: Stand pivot, Ambulatory Chair/bed transfer assist level: Touching or steadying assistance (Pt > 75%) Chair/bed transfer assistive device: Armrests, Medical sales representative     Max distance: 100 Assist level: Touching or steadying assistance (Pt > 75%)   Wheelchair   Type: Manual Max wheelchair distance: 150 Assist Level: Supervision or verbal cues  Cognition Comprehension Comprehension assist level: Understands complex 90% of the time/cues 10% of the time  Expression Expression assist level: Expresses complex 90% of the time/cues < 10% of the time  Social Interaction Social Interaction assist level: Interacts appropriately with others with medication or extra time (anti-anxiety, antidepressant).  Problem Solving Problem solving assist level: Solves complex problems: With extra time  Memory Memory assist level: Recognizes or recalls 75 - 89% of the time/requires cueing 10 - 24% of the time   Medical Problem List and Plan: 1. Left hemiparesis and balance deficits secondary to hypertensive right thalamic and internal capsule hemorrhages  -spent extensive amount of time counseling the patient and daughter  regarding her numerous neurological and orthopedic issues.  2. DVT Prophylaxis/Anticoagulation: SCDs. Encourage mobility 3. Pain Management: Tylenol as needed  -resumed zanaflex for spasms as she takes at home  -add voltaren gel for bilateral knees (OA) 4. Mood/anxiety. Ativan 1 mg 3 times a day as needed (in place of non-formulary serax she uses at home)  Provide emotional support  -will introduce trazodone '25mg'$  qhs to help with sleep 5. Neuropsych: This patient is capable of making decisions on her own behalf. 6. Skin/Wound Care: Routine skin checks 7. Fluids/Electrolytes/Nutrition: intake reasonable. Labs all within normal range yesterday 8. Hypertension. Hydrochlorothiazide 12.5 mg daily, Toprol-XL 25 mg twice a day. Monitor with increased mobility 9. Diastolic congestive heart failure. Monitor for any signs of fluid overload. Check i's and o's as well as daily weights 10. Multiple left hip surgeries. Patient used a crutch and walker prior to admission. Has numerous gait devices at home. Right wrist limited due to pain/CTS in holding a cane or walker----used a crutch at home. 11. GERD: protonix   LOS (Days) 2 A FACE TO FACE EVALUATION WAS PERFORMED  Ileana Chalupa T 10/29/2015 10:39 AM

## 2015-10-29 NOTE — IPOC Note (Signed)
Overall Plan of Care [facility]) Patient Details Name: Theresa Harris MRN: [id number] DOB: 1927/02/26  Admitting Diagnosis: rt non traumatic ICH  Hospital Problems: Principal Problem:   ICH (intracerebral hemorrhage) ([place]) Active Problems:   Ataxia   Sensation alteration, late effect of cerebrovascular disease   Chronic diastolic congestive heart failure ([place])   Essential hypertension     Functional Problem List: Nursing Motor, Nutrition, Sensory  PT Balance, Endurance, Motor, Safety  OT Balance, Edema, Endurance, Motor, Pain, Safety, Sensory  SLP    TR         Basic ADL's: OT Grooming, Dressing, Bathing, Toileting     Advanced  ADL's: OT Simple Meal Preparation     Transfers: PT Bed Mobility, Bed to Chair, Car, Manufacturing systems engineer, Metallurgist: PT Ambulation, Stairs     Additional Impairments: OT Fuctional Use of Upper Extremity  SLP        TR      Anticipated Outcomes Item Anticipated Outcome  Self Feeding n/a  Swallowing      Basic self-care  mod I   Toileting  mod I    Bathroom Transfers mod I   Bowel/Bladder  mod I   Transfers  mod I  Locomotion  mod I  Communication     Cognition     Pain  n/a  Safety/Judgment  independent   Therapy Plan: PT Intensity: Minimum of 1-2 x/day ,45 to 90 minutes PT Frequency: 5 out of 7 days PT Duration Estimated Length of Stay: 7-9 days OT Intensity: Minimum of 1-2 x/day, 45 to 90 minutes OT Frequency: 5 out of 7 days OT Duration/Estimated Length of Stay: 7 days         Team Interventions: Nursing Interventions Patient/Family Education, Disease Management/Prevention, Discharge Planning  PT interventions Ambulation/gait training, Training and development officer, Community reintegration, Discharge planning, DME/adaptive equipment instruction, Functional mobility training, Patient/family education, Neuromuscular re-education, Therapeutic Exercise, Therapeutic Activities, UE/LE Strength  taining/ROM, Stair training, UE/LE Coordination activities  OT Interventions Training and development officer, Academic librarian, Discharge planning, Functional mobility training, Patient/family education, Psychosocial support, Therapeutic Activities, UE/LE Strength taining/ROM, Therapeutic Exercise, Self Care/advanced ADL retraining, Neuromuscular re-education, Disease mangement/prevention, Pain management, UE/LE Coordination activities, Cognitive remediation/compensation, DME/adaptive equipment instruction  SLP Interventions    TR Interventions    SW/CM Interventions Discharge Planning, Psychosocial Support, Patient/Family Education    Team Discharge Planning: Destination: PT-Home ,OT- Home , SLP-  Projected Follow-up: PT-Home health PT, OT-  Home health OT, SLP-  Projected Equipment Needs: PT-None recommended by PT, OT- To be determined, SLP-  Equipment Details: PT- , OT-  Patient/family involved in discharge planning: PT- Patient,  OT-Patient, SLP-   MD ELOS: 7-9 days Medical Rehab Prognosis:  Excellent Assessment: The patient has been admitted for CIR therapies with the diagnosis of right ICH. The team will be addressing functional mobility, strength, stamina, balance, safety, adaptive techniques and equipment, self-care, bowel and bladder mgt, patient and caregiver education, NMR, pain mgt, orthotics, . Goals have been set at mod I for basic mobility/locomotion and self-care /ADL's.    Meredith Staggers, MD, FAAPMR      See Team Conference Notes for weekly updates to the plan of care

## 2015-10-29 NOTE — Progress Notes (Signed)
Social Work  Social Work Assessment and Plan  Patient Details  Name: Theresa Harris MRN: [id number] Date of Birth: [birth year]  Today's Date: 10/29/2015  Problem List:  Patient Active Problem List   Diagnosis Date Noted  . Chronic diastolic congestive heart failure ([place]) 10/28/2015  . Essential hypertension 10/28/2015  . Ataxia   . Sensation alteration, late effect of cerebrovascular disease   . Aneurysm, cerebral, nonruptured 10/26/2015  . Meningioma ([place]) 10/26/2015  . ICH (intracerebral hemorrhage) ([place]) 10/22/2015   Past Medical History:  Past Medical History  Diagnosis Date  . Spinal stenosis   . CHF (congestive heart failure) ([place])   . Anxiety   . HTN (hypertension)    Past Surgical History:  Past Surgical History  Procedure Laterality Date  . Knee surgery      right  . Abdominal hysterectomy    . Hip surgery    . Cholecystectomy    . Carpal tunnel release     Social History:  reports that she has quit smoking. She does not have any smokeless tobacco history on file. She reports that she does not drink alcohol or use illicit drugs.  Family / Support Systems Marital Status: Widow/Widower How Long?: 2004 Patient Roles: Parent, Other (Comment) (sister) Children: daughter, [name] Rppy [facility]) @ (C) [phone];  daughter, Glenda Chroman [facility]) @ (H) [phone] or ([phone];  son, Legrand Como [employer]) Other Supports: sister, Dillard Essex (local) @ ([phone] Anticipated Caregiver: family can check on her as needed Ability/Limitations of Caregiver: only sister lives here locally Caregiver Availability: Intermittent Family Dynamics: Pt describes all family as very attentive and willing to assist, however, only sister lives locally.  Social History Preferred language: English Religion: None Cultural Background: NA Education: HS Read: Yes Write: Yes Employment Status: Retired Date Retired/Disabled/Unemployed: 1996 to care for her husband Insurance underwriter Issues: None Guardian/Conservator: None - per MD, pt is capable of making decisions on her own behalf.   Abuse/Neglect Physical Abuse: Denies Verbal Abuse: Denies Sexual Abuse: Denies Exploitation of patient/patient's resources: Denies Self-Neglect: Denies  Emotional Status Pt's affect, behavior adn adjustment status: Pt very talkative and self distracting but able to complete assessment interview.  She perseverates on how "confused" she is about medical staff telling her that high BP was the cause of her stroke "...when I've kept my BP under control!."  She speaks proudly of how independent she has been able to stay and caring for herself and all needs.  She denies any s/s of any significant emotional distress.  No s/s of depression or anxiety, however, does stress to RN that she "...need by ativan..." explaining  her "chest gets tight sometimes...."  Will monitor and refer to neuropsychology for formal consult if indicated. Recent Psychosocial Issues: None Pyschiatric History: None Substance Abuse History: None  Patient / Family Perceptions, Expectations & Goals Pt/Family understanding of illness & functional limitations: Pt able to provide detailed report of her fall, injury and what she was told by MD.  Kermit Balo appreciation of her functional limitations/ need for CIR. Premorbid pt/family roles/activities: Pt mostly independent PTA, driving and managing all finances.  She did have a housekeeper weekly to assist as she is limited by ortho issues. Anticipated changes in roles/activities/participation: Dependent on progress.  Goals have been set for mod i which would indicate no real change.  Family may need to provide a little closer contact initially. Pt/family expectations/goals: "I want to be able to get back home. I can hire help if I  need it."  [employer]: None Premorbid Home Care/DME Agencies: None Transportation available at discharge:  yes Resource referrals recommended: Neuropsychology  Discharge Planning Living Arrangements: Alone Support Systems: Children, Other relatives, Friends/neighbors Type of Residence: Private residence Insurance Resources: Multimedia programmer (specify) ([employer]) Financial Resources: Social Security Financial Screen Referred: No Living Expenses: Own Money Management: Patient Does the patient have any problems obtaining your medications?: No Home Management: pt and housekeeper Patient/Family Preliminary Plans: Pt plans to return to her home.  If assistance is needed, she has a LTC policy to cover this. Social Work Anticipated Follow Up Needs: HH/OP Expected length of stay: 7-9 days  Clinical Impression Very pleasant, talkative woman here following a fall/ ICH but making excellent gains.  Has goals set for mod i, however, she does have a LTC policy she can access if assistance is needed.  Family supportive, however, all children living out of town.  Denies any s/s of emotional distress.  Have referred to neuropsych for cogn screen.  Will follow for d/c planning and support.  [name] 10/29/2015, 11:00 AM

## 2015-10-29 NOTE — Progress Notes (Signed)
Occupational Therapy Session Note  Patient Details  Name: Theresa Harris MRN: [id number] Date of Birth: [birth year]  Today's Date: 10/29/2015 OT Individual Time: 0700-0800 OT Individual Time Calculation (min): 60 min    Short Term Goals: Week 1:  OT Short Term Goal 1 (Week 1): STG= LTG  Skilled Therapeutic Interventions/Progress Updates:    Pt asleep in bed upon arrival but easily aroused.  Pt engaged in BADL retraining including bathing and dressing with sit<>stand at sink.  Pt declined shower and stated that, in the future, she preferred a female OT to assist.  Pt required min A for sit<>stand and steady A for standing to complete bathing and dressing tasks.  Pt requires more than a reasonable amount of time with multiple rest breaks to complete tasks.  Focus on activity tolerance, functional transfers, sit<>stand, standing balance, and safety awareness to increase independence with BADLs.  Therapy Documentation Precautions:  Precautions Precautions: Fall Restrictions Weight Bearing Restrictions: No Pain:  Pt denied pain  See Function Navigator for Current Functional Status.   Therapy/Group: Individual Therapy  Leroy Libman 10/29/2015, 8:01 AM

## 2015-10-30 ENCOUNTER — Inpatient Hospital Stay (HOSPITAL_COMMUNITY): Payer: PPO | Admitting: Occupational Therapy

## 2015-10-30 ENCOUNTER — Inpatient Hospital Stay (HOSPITAL_COMMUNITY): Payer: PPO

## 2015-10-30 DIAGNOSIS — G4762 Sleep related leg cramps: Secondary | ICD-10-CM

## 2015-10-30 NOTE — Progress Notes (Signed)
Physical Therapy Session Note  Patient Details  Name: Theresa Harris MRN: [id number] Date of Birth: [birth year]  Today's Date: 10/30/2015 PT Individual Time: 0830-0930 PT Individual Time Calculation (min): 60 min   Short Term Goals: Week 1:  PT Short Term Goal 1 (Week 1): =LTGs due to ELOS  Skilled Therapeutic Interventions/Progress Updates:    Patient seated in w/c rolled near sink and locks brakes appropriately.  Sit to stand pulling up on sink minguard for safety to brush teeth supervision for balance.  Ambulated 140' close S to minguard A.  Stand pivot w/c to mat minguard A.  Sit<>stand for LE strength from mat x 5 reps assist for descent and cues to try to slowly sit for LE strengthening, but pt unable due to knee pain.  Sit to supine mod I on mat for LE therex as noted below.  Patient returned to sitting with min A due to no bed rails.  Standing in parallel bars for balance work to include side stepping, forward walk with one rail for support and minguard cues for posture.  Gait to room S to minguard with RW cues for proximity to walker, (adjusted walker for height and added tennis balls for less catching of back legs on flooring,) and for posture.  Patient left seated EOB with bed alarm and RN informed pt requesting Voltaren gel for knee pain. Therapy Documentation Precautions:  Precautions Precautions: Fall Restrictions Weight Bearing Restrictions: No Pain: Pain Assessment Pain Assessment: No/denies pain Pain Score: 5  Pain Type: Acute pain Pain Location: Knee Pain Orientation: Left;Right Pain Onset: With Activity Pain Intervention(s): Rest;RN made aware Exercises: General Exercises - Lower Extremity Hip ABduction/ADduction: Strengthening;10 reps;Supine;Both (orange t-band) Straight Leg Raises: Strengthening;10 reps;Supine;Both Repetitive Sit to Stands: 8 reps (UE's on walker, cues for slow descent) Repetitive Sit to Stands Level of Assist: Min Repetitive Sit to Stands  Surface Height: 20 Total Joint Exercises Bridges: Both;Strengthening;10 reps Other Exercises Other Exercises: supine trunk rotation  See Function Navigator for Current Functional Status.   Therapy/Group: Individual Therapy  [place], [place] 10/30/2015  10/30/2015, 9:22 AM

## 2015-10-30 NOTE — Progress Notes (Signed)
Physical Therapy Session Note  Patient Details  Name: Theresa Harris MRN: [id number] Date of Birth: 1927-08-24  Today's Date: 10/30/2015 PT Individual Time: 1132-1204 PT Individual Time Calculation (min): 32 min   Short Term Goals: Week 1:  PT Short Term Goal 1 (Week 1): =LTGs due to ELOS  Skilled Therapeutic Interventions/Progress Updates:    Patient seated in recliner, sit to stand supervision ambulated to day room x 120' RW and minguard to supervision over tile and carpeted surfaces cues for posture, proximity to walker and foot clearance.  Patient practiced transfers to various pieces of furniture including couch (min A uncontrolled descent and increased time and assist to stand,) to armchair and chair without arms (uncontrolled descent and practiced again several times after instructional cues for safety.)  Patient ambulated around furniture in simulated home environment close S cues for safety with turns in smaller spaces.  Patient ambulated back to room x 130' with RW and supervision and left sitting in recliner with lunch tray set up an call bell in reach.  Therapy Documentation Precautions:  Precautions Precautions: Fall Restrictions Weight Bearing Restrictions: No Pain: Pain Assessment Pain Score: 5  Pain Location: Knee Pain Orientation: Left Pain Descriptors / Indicators: Grimacing Pain Onset: With Activity Pain Intervention(s): Repositioned   See Function Navigator for Current Functional Status.   Therapy/Group: Individual Therapy  [place], [place] 10/30/2015  10/30/2015, 4:12 PM

## 2015-10-30 NOTE — Progress Notes (Signed)
PRN Ativan given at 2004 per patient's request for anxiety. PRN tylenol given at 2254 for complaint of BLE discomfort. PRN zanaflex given at 0023 for reports of BLE spasms. +/- sleep. [name] A

## 2015-10-30 NOTE — Progress Notes (Signed)
Theresa Harris is a 81 y.o. female February 05, 1927 [id number]  Subjective: Reports BLE cramping and "drawing up" during night. ?if RLS symptoms? Otherwise feeling OK.  Objective: Vital signs in last 24 hours: Temp:  [97.5 F (36.4 C)-98.5 F (36.9 C)] 98.5 F (36.9 C) (12/03 0646) Pulse Rate:  [49-64] 64 (12/03 0813) Resp:  [17-18] 17 (12/03 0646) BP: (142-163)/(39-60) 142/60 mmHg (12/03 0813) SpO2:  [98 %] 98 % (12/03 0646) Weight change:  Last BM Date: 10/29/15  Intake/Output from previous day: 12/02 0701 - 12/03 0700 In: 840 [P.O.:840] Out: -   Physical Exam General: No apparent distress   Supine in bed Lungs: Normal effort. Lungs clear to auscultation, no crackles or wheezes. Cardiovascular: Regular rate and rhythm, no edema Musculoskeletal:  Neurovascularly intact Neurological: No new neurological deficits Wounds: N/A      Lab Results: BMET    Component Value Date/Time   NA 135 10/28/2015 0450   NA 135* 06/30/2014 1025   K 3.5 10/28/2015 0450   K 3.7 06/30/2014 1025   CL 100* 10/28/2015 0450   CL 98 06/30/2014 1025   CO2 28 10/28/2015 0450   CO2 27 06/30/2014 1025   GLUCOSE 98 10/28/2015 0450   GLUCOSE 104* 06/30/2014 1025   BUN 13 10/28/2015 0450   BUN 14 06/30/2014 1025   CREATININE 0.78 10/28/2015 0450   CREATININE 0.86 06/30/2014 1025   CALCIUM 8.8* 10/28/2015 0450   CALCIUM 9.1 06/30/2014 1025   GFRNONAA >60 10/28/2015 0450   GFRNONAA >60 06/30/2014 1025   GFRAA >60 10/28/2015 0450   GFRAA >60 06/30/2014 1025   CBC    Component Value Date/Time   WBC 5.8 10/28/2015 0450   WBC 6.2 08/19/2014 1153   RBC 3.79* 10/28/2015 0450   RBC 4.11 08/19/2014 1153   HGB 11.7* 10/28/2015 0450   HGB 12.9 08/19/2014 1153   HCT 33.9* 10/28/2015 0450   HCT 38.9 08/19/2014 1153   PLT 185 10/28/2015 0450   PLT 221 08/19/2014 1153   MCV 89.4 10/28/2015 0450   MCV 95 08/19/2014 1153   MCH 30.9 10/28/2015 0450   MCH 31.4 08/19/2014 1153   MCHC 34.5 10/28/2015 0450    MCHC 33.1 08/19/2014 1153   RDW 12.9 10/28/2015 0450   RDW 13.3 08/19/2014 1153   LYMPHSABS 2.1 10/28/2015 0450   LYMPHSABS 1.7 08/19/2014 1153   MONOABS 0.9 10/28/2015 0450   MONOABS 0.6 08/19/2014 1153   EOSABS 0.1 10/28/2015 0450   EOSABS 0.0 08/19/2014 1153   BASOSABS 0.0 10/28/2015 0450   BASOSABS 0.0 08/19/2014 1153   CBG's (last 3):  No results for input(s): GLUCAP in the last 72 hours. LFT's Lab Results  Component Value Date   ALT 25 10/28/2015   AST 28 10/28/2015   ALKPHOS 43 10/28/2015   BILITOT 0.7 10/28/2015    Studies/Results: No results found.  Medications:  I have reviewed the patient's current medications. Scheduled Medications: . calcium-vitamin D  1 tablet Oral BID  . diclofenac sodium  2 g Topical TID  . estradiol  0.5 mg Oral Daily  . hydrochlorothiazide  12.5 mg Oral Daily  . hydrocortisone cream   Topical TID  . magnesium oxide  400 mg Oral Daily  . metoprolol succinate  25 mg Oral BID  . pantoprazole  40 mg Oral BID   PRN Medications: acetaminophen **OR** acetaminophen, loperamide, LORazepam, ondansetron **OR** ondansetron (ZOFRAN) IV, sorbitol, tiZANidine  Assessment/Plan: Principal Problem:   ICH (intracerebral hemorrhage) ([facility]) Active Problems:   Ataxia  Sensation alteration, late effect of cerebrovascular disease   Chronic diastolic congestive heart failure ([place])   Essential hypertension  1. Left hemiparesis and balance deficits secondary to hypertensive right thalamic and internal capsule hemorrhages -continue ongoing therapy in CIR as ordered 2. DVT Prophylaxis/Anticoagulation: SCDs. Encourage mobility 3. Pain Management: Tylenol as needed -continue Mag and zanaflex for spasms as she takes at home -added voltaren gel for bilateral knees (OA) 4. Mood/anxiety. Ativan 1 mg 3 times a day as needed (in place of non-formulary serax she uses at home) Provide emotional  support -recently introduced trazodone '25mg'$  qhs while in CIR to help with sleep 5. Neuropsych: This patient is capable of making decisions on her own behalf. 6. Skin/Wound Care: Routine skin checks 7. Fluids/Electrolytes/Nutrition: intake reasonable.  8. Hypertension. Hydrochlorothiazide 12.5 mg daily, Toprol-XL 25 mg twice a day. Monitor with increased mobility 9. Diastolic congestive heart failure. Monitor for any signs of fluid overload. Check i's and o's as well as daily weights 10. Multiple left hip surgeries. Patient used a crutch and walker prior to admission. Has numerous gait devices at home. Right wrist limited due to pain/CTS in holding a cane or walker----used a crutch at home. 11. GERD: protonix  Length of stay, days: 3   Valerie A. Asa Lente, MD 10/30/2015, 11:06 AM

## 2015-10-30 NOTE — Progress Notes (Signed)
Occupational Therapy Session Note  Patient Details  Name: Theresa Harris MRN: [id number] Date of Birth: [birth year]  Today's Date: 10/30/2015 OT Individual Time: [phone] and 1445-1530 OT Individual Time Calculation (min): 55 min and 45 min   Short Term Goals: Week 1:  OT Short Term Goal 1 (Week 1): STG= LTG  Skilled Therapeutic Interventions/Progress Updates:  Session 1: Upon entering the room, pt supine with no c/o pain this session. OT intervention with focus on functional mobility, functional transfers, self care, and pt education. Pt ambulating with RW and min A to bathroom for toileting. Pt performing toilet transfer onto standard height toilet with steady assistance . Pt able to perform clothing management and hygiene with steady assist. Pt performing shower transfer with min A and pt seated on shower chair while bathing. Pt standing with steady assistance and use of grab bar to wash buttocks and peri area. Pt sitting on EOB for dressing tasks. Increased rest breaks for fatigue this session. Pt seated in wheelchair at end of session with call bell and all needed items within reach upon exiting the room.   Session 2:Upon entering the room, pt supine in bed with no c/o pain this session. Pt performed supine >sit with increased time and use of bed rail. Pt seated on EOB without B feet support to work on L hand strengthening with use of red, medium soft theraputty. OT demonstrating exercises with pt returning demonstrations with min verbal cues for proper technique. Pt also educated on secondary stroke risk factors as well as symptoms of stroke. Pt becoming very emotional during conversation. Education to continue. Pt returning to supine at end of session with bed alarm activated and call bell within reach upon exiting the room.   Therapy Documentation Precautions:  Precautions Precautions: Fall Restrictions Weight Bearing Restrictions: No General:   Vital Signs: Therapy Vitals Temp:  98.5 F (36.9 C) Temp Source: Oral Pulse Rate: 60 Resp: 17 BP: (!) 151/54 mmHg Patient Position (if appropriate): Lying Oxygen Therapy SpO2: 98 % O2 Device: Not Delivered  See Function Navigator for Current Functional Status.   Therapy/Group: Individual Therapy  Phineas Semen 10/30/2015, 7:58 AM

## 2015-10-31 ENCOUNTER — Inpatient Hospital Stay (HOSPITAL_COMMUNITY): Payer: PPO | Admitting: Physical Therapy

## 2015-10-31 NOTE — Progress Notes (Signed)
Physical Therapy Session Note  Patient Details  Name: Theresa Harris MRN: [id number] Date of Birth: January 12, 1927  Today's Date: 10/31/2015 PT Individual Time: 1115-1200 PT Individual Time Calculation (min): 45 min   Short Term Goals: Week 1:  PT Short Term Goal 1 (Week 1): =LTGs due to ELOS  Skilled Therapeutic Interventions/Progress Updates:   Pt limited by pain and promximal weakness/stability throughout session. Pt does benefit from R lateral L/S flexor facilitation in session for improved pain and increased hip ROM with marching. Pt would continue to benefit from skilled PT services to increase functional mobility.  Therapy Documentation Precautions:  Precautions Precautions: Fall Restrictions Weight Bearing Restrictions: No Vital Signs: Therapy Vitals Pulse Rate: 68 BP: 130/64 mmHg Pain: Pain Assessment Pain Assessment: 0-10 Pain Score: 5  Pain Location: Knee Pain Orientation: Left;Right Pain Onset: With Activity Pain Intervention(s): Repositioned Mobility:  Min guard with cues for hand placement for transfers, Min Guard with cues for technique for bed mobility Locomotion :   Pt ambulates with RW with cues for heel strike, LE clearance, posture, and core activation. Other Treatments:  Pt educated on rehab plan, core stability, progressing mobility, and safety in mobility. Pt performs sidelying PNF with emphasis on pelvis anterior elevation. Pt performs PNF resisted ankle DF with marching 2x10. Pt performs standing marching, heel raises, and weight shifts 2x10. Pt performs static standing balance 1'x2. Pt perform static standing balance with EC 20"x3. Facilitation of R lateral flexors performed.   See Function Navigator for Current Functional Status.   Therapy/Group: Individual Therapy  Monia Pouch 10/31/2015, 11:48 AM

## 2015-10-31 NOTE — Progress Notes (Signed)
Theresa Harris is a 81 y.o. female October 18, 1927 [id number]  Subjective: Leg cramps less severe but still occuring qhs. Improved with zanaflex prn as given  Objective: Vital signs in last 24 hours: Temp:  [97.5 F (36.4 C)-98.1 F (36.7 C)] 98.1 F (36.7 C) (12/04 0514) Pulse Rate:  [55-72] 68 (12/04 0855) Resp:  [16-17] 17 (12/04 0514) BP: (116-130)/(35-66) 130/64 mmHg (12/04 0855) SpO2:  [97 %-98 %] 98 % (12/04 0514) Weight change:  Last BM Date: 10/29/15  Intake/Output from previous day: 12/03 0701 - 12/04 0700 In: 840 [P.O.:840] Out: -   Physical Exam General: No apparent distress   Supine in bed Lungs: Normal effort. Lungs clear to auscultation, no crackles or wheezes. Cardiovascular: Regular rate and rhythm, no edema Musculoskeletal:  Neurovascularly intact Neurological: No new neurological deficits  Lab Results: BMET    Component Value Date/Time   NA 135 10/28/2015 0450   NA 135* 06/30/2014 1025   K 3.5 10/28/2015 0450   K 3.7 06/30/2014 1025   CL 100* 10/28/2015 0450   CL 98 06/30/2014 1025   CO2 28 10/28/2015 0450   CO2 27 06/30/2014 1025   GLUCOSE 98 10/28/2015 0450   GLUCOSE 104* 06/30/2014 1025   BUN 13 10/28/2015 0450   BUN 14 06/30/2014 1025   CREATININE 0.78 10/28/2015 0450   CREATININE 0.86 06/30/2014 1025   CALCIUM 8.8* 10/28/2015 0450   CALCIUM 9.1 06/30/2014 1025   GFRNONAA >60 10/28/2015 0450   GFRNONAA >60 06/30/2014 1025   GFRAA >60 10/28/2015 0450   GFRAA >60 06/30/2014 1025   CBC    Component Value Date/Time   WBC 5.8 10/28/2015 0450   WBC 6.2 08/19/2014 1153   RBC 3.79* 10/28/2015 0450   RBC 4.11 08/19/2014 1153   HGB 11.7* 10/28/2015 0450   HGB 12.9 08/19/2014 1153   HCT 33.9* 10/28/2015 0450   HCT 38.9 08/19/2014 1153   PLT 185 10/28/2015 0450   PLT 221 08/19/2014 1153   MCV 89.4 10/28/2015 0450   MCV 95 08/19/2014 1153   MCH 30.9 10/28/2015 0450   MCH 31.4 08/19/2014 1153   MCHC 34.5 10/28/2015 0450   MCHC 33.1  08/19/2014 1153   RDW 12.9 10/28/2015 0450   RDW 13.3 08/19/2014 1153   LYMPHSABS 2.1 10/28/2015 0450   LYMPHSABS 1.7 08/19/2014 1153   MONOABS 0.9 10/28/2015 0450   MONOABS 0.6 08/19/2014 1153   EOSABS 0.1 10/28/2015 0450   EOSABS 0.0 08/19/2014 1153   BASOSABS 0.0 10/28/2015 0450   BASOSABS 0.0 08/19/2014 1153   CBG's (last 3):  No results for input(s): GLUCAP in the last 72 hours. LFT's Lab Results  Component Value Date   ALT 25 10/28/2015   AST 28 10/28/2015   ALKPHOS 43 10/28/2015   BILITOT 0.7 10/28/2015    Studies/Results: No results found.  Medications:  I have reviewed the patient's current medications. Scheduled Medications: . calcium-vitamin D  1 tablet Oral BID  . diclofenac sodium  2 g Topical TID  . estradiol  0.5 mg Oral Daily  . hydrochlorothiazide  12.5 mg Oral Daily  . hydrocortisone cream   Topical TID  . magnesium oxide  400 mg Oral Daily  . metoprolol succinate  25 mg Oral BID  . pantoprazole  40 mg Oral BID   PRN Medications: acetaminophen **OR** acetaminophen, loperamide, LORazepam, ondansetron **OR** ondansetron (ZOFRAN) IV, sorbitol, tiZANidine  Assessment/Plan: Principal Problem:   ICH (intracerebral hemorrhage) ([facility]) Active Problems:   Ataxia   Sensation alteration, late  effect of cerebrovascular disease   Chronic diastolic congestive heart failure ([place])   Essential hypertension  1. Left hemiparesis and balance deficits secondary to hypertensive right thalamic and internal capsule hemorrhages -continue ongoing therapy in CIR as ordered 2. DVT Prophylaxis/Anticoagulation: SCDs. Encourage mobility 3. Pain Management: Tylenol as needed -continue Mag and zanaflex for spasms as she takes at home -added voltaren gel for bilateral knees (OA) 4. Mood/anxiety. Ativan 1 mg 3 times a day as needed (in place of non-formulary serax she uses at home) Provide emotional support 5. Neuropsych: This  patient is capable of making decisions on her own behalf. 6. Skin/Wound Care: Routine skin checks 7. Fluids/Electrolytes/Nutrition: intake reasonable.  8. Hypertension. Hydrochlorothiazide 12.5 mg daily, Toprol-XL 25 mg twice a day. Monitor with increased mobility 9. Diastolic congestive heart failure. Monitor for any signs of fluid overload. Check i's and o's as well as daily weights 10. Multiple left hip surgeries. Patient used a crutch and walker prior to admission. Has numerous gait devices at home. Right wrist limited due to pain/CTS in holding a cane or walker----used a crutch at home. 11. GERD: protonix  Length of stay, days: 4   Allyse Fregeau A. Asa Lente, MD 10/31/2015, 9:18 AM

## 2015-10-31 NOTE — Progress Notes (Signed)
Ativan given at [place] for complaints of anxiety. PRN zanaflex given at 2306  For complaint of spasms to BLE's. Theresa Harris A

## 2015-11-01 ENCOUNTER — Inpatient Hospital Stay (HOSPITAL_COMMUNITY): Payer: PPO | Admitting: Physical Therapy

## 2015-11-01 ENCOUNTER — Inpatient Hospital Stay (HOSPITAL_COMMUNITY): Payer: PPO | Admitting: Occupational Therapy

## 2015-11-01 ENCOUNTER — Encounter (HOSPITAL_COMMUNITY): Payer: PPO

## 2015-11-01 NOTE — Progress Notes (Signed)
Physical Therapy Session Note  Patient Details  Name: Theresa Harris MRN: [id number] Date of Birth: 1927-02-18  Today's Date: 11/01/2015 PT Individual Time: 0910-1004 and 1500-1534 PT Individual Time Calculation (min): 54 min and 34 min   Short Term Goals: Week 1:  PT Short Term Goal 1 (Week 1): =LTGs due to ELOS  Skilled Therapeutic Interventions/Progress Updates:    Session 1: Pt received resting in w/c and agreeable to therapy session.  Session focus on dynamic standing balance and overall strength/endurance.  Pt propelled w/c to therapy gym using combination of BUEs and BLEs with 2 short rest breaks.  PT instructed patient in dynamic standing balance activity reaching for and stacking cones and reaching for and tossing horse shoes with emphasis on forward reach and return to upright posture.  Pt required RUE support on RW and steady assist fade to supervision for safety.  PT instructed patient in BLE therex x15 reps for heel/toe raises, LAQ, hip flexion, ball squeezes, and hip abd with level 2 theraband (RLE followed by LLE for isolation of LLE hip abd).  PT instructed patient in gait training with RW x150' with steady assist faded to supervision, pt requires verbal cues for positioning of RW and maintaining B hand support on RW.  Pt requesting to use restroom at end of session.  Overall supervision for toileting, see function tab for details.  Supervision for dynamic standing balance washing hands at sink.  Pt again required verbal cues for keeping both hands on RW when amb back to w/c from sink.  Pt positioned seated in w/c at end of session with Neuropsych present.    Session 2: Pt received resting in w/c and agreeable to therapy session.  Session focus on LLE NMR, strengthening, and activity tolerance.  Pt transported to/from gym in w/c total assist for time management.  PT instructed pt in LLE NMR in // bars stepping up on 6" step with mod assist fade to 4" step with steady assist, tapping  different patterns with LLE, then alternating LEs.  Pt able to tolerate activity x10 min in standing before requiring seated rest break.  PT instructed patient in Kinetron in seated position at 40 cm/s x5 min for endurance and NMR.  Pt transfers throughout session with stand/pivot and supervision for safety.  Pt requesting toileting at conclusion of session and performed all tasks with overall supervision for safety.  Pt returned to bed at end of session and left sitting EOB with bed alarm activated, call bell in reach and needs met.   Therapy Documentation Precautions:  Precautions Precautions: Fall Restrictions Weight Bearing Restrictions: No Pain: Pain Assessment Pain Assessment: No/denies pain   See Function Navigator for Current Functional Status.   Therapy/Group: Individual Therapy  Earnest Conroy Penven-Crew 11/01/2015, 9:49 AM

## 2015-11-01 NOTE — Progress Notes (Signed)
Occupational Therapy Session Note  Patient Details  Name: Theresa Harris MRN: [id number] Date of Birth: Jun 26, 1927  Today's Date: 11/01/2015 OT Individual Time: [phone] and [id number] OT Individual Time Calculation (min): 87 min and 27 min   Short Term Goals: Week 1:  OT Short Term Goal 1 (Week 1): STG= LTG  Skilled Therapeutic Interventions/Progress Updates:  Session 1: Upon entering the room, pt supine in bed with reports of not sleeping well last night. Pt agreeable to OT intervention with focus on functional mobility/transfers, self care retraining, safety, balance, and energy conservation. Pt ambulating short distance in room of 15' with use of RW and steady assist. Pt needing min verbal cues for safety awareness with use of RW. Min A shower transfer with pt bathing at shower level from shower chair. Pt leaning from side to side to wash buttocks and peri area with steady assist. Long handled sponge issues this session in order to increase I with LB bathing. Pt dressed while seated in wheelchair at sink. Pt utilized sock aide in order to don B slip resistance socks this session as AE is familiar to her. Grooming performed at sink while OT educated pt on energy conservation as she needed several rest breaks throughout session secondary to fatigue. Pt remained in wheelchair at end of session with breakfast tray placed in front of her. Call bell and all needed items within reach upon exiting the room.   Session 2: Upon entering the room, pt supine in bed sleeping. OT educated pt on the importance of remaining out of the bed during the day as pt has been in bed every time this therapist has seen her. Pt performed supine >sit with supervision. Pt able to slip B feet into shoes and ambulate with steady assist and RW with 1 L of balance to the left requiring mod A to correct. Pt ambulated to bathroom for close supervision for toilet transfer, hygiene, and clothing management. Pt required mod verbal  cues to remain within RW with ambulation, turns, and transfers for safety. Pt verbalized understanding. Pt seated in wheelchair for sit <>stand x 5 reps with controlled sit with supervision and min verbal cues for proper technique. Pt remained in wheelchair with call bell within reach and visitor entering the room.   Therapy Documentation Precautions:  Precautions Precautions: Fall Restrictions Weight Bearing Restrictions: No General:   Vital Signs: Therapy Vitals Pulse Rate: 60 Resp: 18 BP: (!) 130/32 mmHg Patient Position (if appropriate): Lying Oxygen Therapy SpO2: 98 % O2 Device: Not Delivered Pain: Pain Assessment Pain Assessment: No/denies pain  See Function Navigator for Current Functional Status.   Therapy/Group: Individual Therapy  Phineas Semen 11/01/2015, 8:28 AM

## 2015-11-01 NOTE — Progress Notes (Signed)
[facility] PHYSICAL MEDICINE & REHABILITATION     PROGRESS NOTE    Subjective/Complaints: Had a fair night. Still with "restless legs"/ cramps at night. Up with OT in the shower already this morning---pain can be limiting at time.s   ROS: Pt denies fever, rash/itching, headache, blurred or double vision, nausea, vomiting, abdominal pain, diarrhea, chest pain, , palpitations, dysuria, dizziness, neck or back pain, bleeding,   or depression   Objective: Vital Signs: Blood pressure 130/32, pulse 60, temperature 97.6 F (36.4 C), temperature source Oral, resp. rate 18, height 5' (1.524 m), weight 57.9 kg (127 lb 10.3 oz), SpO2 98 %. No results found. No results for input(s): WBC, HGB, HCT, PLT in the last 72 hours. No results for input(s): NA, K, CL, GLUCOSE, BUN, CREATININE, CALCIUM in the last 72 hours.  Invalid input(s): CO CBG (last 3)  No results for input(s): GLUCAP in the last 72 hours.  Wt Readings from Last 3 Encounters:  10/27/15 57.9 kg (127 lb 10.3 oz)  10/22/15 58.7 kg (129 lb 6.6 oz)  10/22/15 57.153 kg (126 lb)    Physical Exam:  Constitutional: She is oriented to person, place, and time. She appears well-developed and well-nourished.  HENT:  Head: Normocephalic.  Right Ear: External ear normal.  Left Ear: External ear normal.  Eyes: Conjunctivae and EOM are normal.  Neck: Normal range of motion. Neck supple. No thyromegaly present.  Cardiovascular: Normal rate and regular rhythm.  Respiratory: Effort normal and breath sounds normal. No respiratory distress.  GI: Soft. Bowel sounds are normal. She exhibits no distension.  Musculoskeletal: She exhibits tenderness. She exhibits no edema. Reduced Left hip ROM- mainly flexion restricted, Valgus deformities bilateral knees Neurological: She is alert and oriented to person, place, and time.  Alert and oriented 3. Follows commands.  Fair awareness of deficits. Very verbose, tangential at times. DTRs 3+  LUE/LLE Decreased LT/pain left arm/leg. Motor: RUE 4+/5 proximal to distal RLE 4+/5 HF, KE and ADF/APF LUE: 4/5 delt, bicep,tricep, wrist, hand---mild PD, decreased FMC LLE hip flexion 4-/5 (previous hip surgery), ankle dorsi/plantar flexion 4/5  Skin: Skin is warm and dry.  Psychiatric: She has a normal mood and affect. Her behavior is normal. Judgment normal  Assessment/Plan: 1. 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 Physiatrist is providing close team supervision and 24 hour management of active medical problems listed below. Physiatrist and rehab team continue to assess barriers to discharge/monitor patient progress toward functional and medical goals.  Function:  Bathing Bathing position   Position: Shower  Bathing parts Body parts bathed by patient: Right arm, Left arm, Chest, Abdomen, Front perineal area, Buttocks, Right upper leg, Left upper leg, Right lower leg, Left lower leg Body parts bathed by helper: Back  Bathing assist Assist Level: Touching or steadying assistance(Pt > 75%)      Upper Body Dressing/Undressing Upper body dressing   What is the patient wearing?: Pull over shirt/dress, Bra Bra - Perfomed by patient: Thread/unthread right bra strap, Thread/unthread left bra strap, Hook/unhook bra (pull down sports bra)   Pull over shirt/dress - Perfomed by patient: Thread/unthread right sleeve, Thread/unthread left sleeve, Put head through opening, Pull shirt over trunk          Upper body assist Assist Level: Supervision or verbal cues   Set up : To obtain clothing/put away  Lower  Body Dressing/Undressing Lower body dressing   What is the patient wearing?: Pants, Non-skid slipper socks Underwear - Performed by patient: Thread/unthread right underwear  leg, Thread/unthread left underwear leg, Pull underwear up/down Underwear - Performed by helper: Thread/unthread right underwear leg, Thread/unthread left underwear leg, Pull underwear up/down Pants- Performed by patient: Thread/unthread right pants leg, Thread/unthread left pants leg, Pull pants up/down Pants- Performed by helper: Thread/unthread right pants leg, Thread/unthread left pants leg, Pull pants up/down Non-skid slipper socks- Performed by patient: Don/doff right sock, Don/doff left sock Non-skid slipper socks- Performed by helper: Don/doff right sock, Don/doff left sock       Shoes - Performed by helper: Don/doff right shoe, Don/doff left shoe, Fasten right, Fasten left          Lower body assist Assist for lower body dressing: Touching or steadying assistance (Pt > 75%)      Toileting Toileting   Toileting steps completed by patient: Adjust clothing prior to toileting, Performs perineal hygiene, Adjust clothing after toileting   Toileting Assistive Devices: Grab bar or rail  Toileting assist Assist level: Touching or steadying assistance (Pt.75%)   Transfers Chair/bed transfer   Chair/bed transfer method: Stand pivot Chair/bed transfer assist level: Touching or steadying assistance (Pt > 75%) Chair/bed transfer assistive device: Environmental consultant, Air cabin crew     Max distance: 15' Assist level: Touching or steadying assistance (Pt > 75%)   Wheelchair   Type: Manual Max wheelchair distance: 100 Assist Level: Supervision or verbal cues  Cognition Comprehension Comprehension assist level: Understands complex 90% of the time/cues 10% of the time  Expression Expression assist level: Expresses complex 90% of the time/cues < 10% of the time  Social Interaction Social Interaction assist level: Interacts appropriately with others with medication or extra time (anti-anxiety, antidepressant).  Problem Solving Problem solving assist level: Solves complex 90%  of the time/cues < 10% of the time  Memory Memory assist level: Recognizes or recalls 75 - 89% of the time/requires cueing 10 - 24% of the time   Medical Problem List and Plan: 1. Left hemiparesis and balance deficits secondary to hypertensive right thalamic and internal capsule hemorrhages  -spent extensive amount of time counseling the patient and daughter regarding her numerous neurological and orthopedic issues.  2. DVT Prophylaxis/Anticoagulation: SCDs. Encourage mobility 3. Pain Management: Tylenol as needed  -resumed zanaflex for spasms per home schedule  -added voltaren gel for bilateral knees (OA) 4. Mood/anxiety. Ativan 1 mg 3 times a day as needed (in place of non-formulary serax she uses at home)  Provide emotional support  - trazodone '25mg'$  qhs to help with sleep 5. Neuropsych: This patient is capable of making decisions on her own behalf. 6. Skin/Wound Care: Routine skin checks 7. Fluids/Electrolytes/Nutrition: intake reasonable.  8. Hypertension. Hydrochlorothiazide 12.5 mg daily, Toprol-XL 25 mg twice a day. Monitor with increased mobility 9. Diastolic congestive heart failure. Monitor for any signs of fluid overload. Check i's and o's as well as daily weights 10. Multiple left hip surgeries.is a long term fall risk 11. GERD: protonix   LOS (Days) 5 A FACE TO FACE EVALUATION WAS PERFORMED  Nagi Furio T 11/01/2015 9:00 AM

## 2015-11-02 ENCOUNTER — Inpatient Hospital Stay (HOSPITAL_COMMUNITY): Payer: PPO | Admitting: Physical Therapy

## 2015-11-02 ENCOUNTER — Inpatient Hospital Stay (HOSPITAL_COMMUNITY): Payer: PPO | Admitting: Occupational Therapy

## 2015-11-02 DIAGNOSIS — I610 Nontraumatic intracerebral hemorrhage in hemisphere, subcortical: Secondary | ICD-10-CM | POA: Insufficient documentation

## 2015-11-02 MED ORDER — TIZANIDINE HCL 2 MG PO TABS
1.0000 mg | ORAL_TABLET | Freq: Three times a day (TID) | ORAL | Status: DC | PRN
  Administered 2015-11-02: 1 mg via ORAL
  Filled 2015-11-02 (×3): qty 1

## 2015-11-02 NOTE — Progress Notes (Signed)
Physical Therapy Session Note  Patient Details  Name: Theresa Harris MRN: [id number] Date of Birth: 22-Sep-1927  Today's Date: 11/02/2015 PT Individual Time: 0902-1005 and 1515-1610 PT Individual Time Calculation (min): 63 min and 55 min    Short Term Goals: Week 1:  PT Short Term Goal 1 (Week 1): =LTGs due to ELOS  Skilled Therapeutic Interventions/Progress Updates:    Session 1: Pt received resting in chair and agreeable to therapy session.  Session focus on LE strength, L hip strength, NMR, gait training and overall safety with functional mobility. Pt amb x100' with RW and antalgic gait on LLE, requesting to use w/c for remaining distance to gym.  Once in gym pt transfers stand/pivot with supervision to [facility] and performs 2x5 minutes on kinetron in seated position at 40 cm/s.  Pt transfers back to w/c supervision and positions self in front of // bars.  PT instructs patient in OTAGO level A exercises except stairs and sit<>stands.  Pt performs x10 reps of LAQ, hamstring curl in standing, minisquats, hip abd in standing, and tandem stance x10 secs each with R/L foot ahead.  PT instructs patient in dynamic standing balance activity with dual focus of strengthening L hip abductors.  Pt stood with L foot on 4" step and moved horse shoes from one side of // bars to the other side and back, emphasis on upright posture, reaching, and crossing midline.  Pt able to complete task x2 with R and with L foot placed on 4" step.  Pt amb x150' back to room with RW with decrease in antalgic gait.  Pt requesting to toilet at end of session and performed tasks with set up/supervision overall.  See function tab for details.  Pt demos improved safety awareness with walker position throughout session without verbal cues.  Pt positioned in supine at end of session with call bell in reach and needs met.    Session 2: Pt received resting in bed and agreeable to therapy session.  PT provided pt education on need to  balance rest and OOB; pt resistant to education stating that she only rests when she really needs it.  Session focus on dynamic balance, overall strengthening and endurance, safety awareness, and gait with RW.  Pt amb to therapy gym with RW and supervision demonstrating slightly antaglic gait pattern, though improved from AM session.  PT instructed patient in dynamic standing balance activity reaching for and matching 27 cards in 10 minutes.  PT instructed patient in UE and core strengthening exercise spelling 4 words with 1# weighted ball.  Pt amb back to room in same manner as above and requested to use bathroom.  Toileting with overall set up/supervision, see function tab for details.  Pt seated in straight back chair at end of session with call bell in reach and needs met.    Therapy Documentation Precautions:  Precautions Precautions: Fall Restrictions Weight Bearing Restrictions: No Pain: Pain Assessment Pain Assessment: 0-10 Pain Score: 5  Pain Location: Knee Pain Orientation: Left Pain Intervention(s): Emotional support;Ambulation/increased activity   See Function Navigator for Current Functional Status.   Therapy/Group: Individual Therapy  Jamyron Redd E Penven-Crew 11/02/2015, 10:40 AM

## 2015-11-02 NOTE — Progress Notes (Signed)
[facility] PHYSICAL MEDICINE & REHABILITATION     PROGRESS NOTE    Subjective/Complaints: Left "knee jerks" sometimes at night. Overall pain better. zanaflex helps cramping---can only tolerate '1mg'$  dose.   ROS: Pt denies fever, rash/itching, headache, blurred or double vision, nausea, vomiting, abdominal pain, diarrhea, chest pain, , palpitations, dysuria, dizziness, neck or back pain, bleeding,   or depression   Objective: Vital Signs: Blood pressure 130/50, pulse 68, temperature 97.8 F (36.6 C), temperature source Oral, resp. rate 18, height 5' (1.524 m), weight 57.9 kg (127 lb 10.3 oz), SpO2 98 %. No results found. No results for input(s): WBC, HGB, HCT, PLT in the last 72 hours. No results for input(s): NA, K, CL, GLUCOSE, BUN, CREATININE, CALCIUM in the last 72 hours.  Invalid input(s): CO CBG (last 3)  No results for input(s): GLUCAP in the last 72 hours.  Wt Readings from Last 3 Encounters:  10/27/15 57.9 kg (127 lb 10.3 oz)  10/22/15 58.7 kg (129 lb 6.6 oz)  10/22/15 57.153 kg (126 lb)    Physical Exam:  Constitutional: She is oriented to person, place, and time. She appears well-developed and well-nourished.  HENT:  Head: Normocephalic.  Right Ear: External ear normal.  Left Ear: External ear normal.  Eyes: Conjunctivae and EOM are normal.  Neck: Normal range of motion. Neck supple. No thyromegaly present.  Cardiovascular: Normal rate and regular rhythm.  Respiratory: Effort normal and breath sounds normal. No respiratory distress.  GI: Soft. Bowel sounds are normal. She exhibits no distension.  Musculoskeletal: She exhibits tenderness. She exhibits no edema. Reduced Left hip ROM- mainly flexion restricted, Valgus deformities bilateral knees Neurological: She is alert and oriented to person, place, and time.  Alert and oriented 3. Follows commands.  Fair awareness of deficits. Very verbose, tangential at times. DTRs 3+ LUE/LLE Decreased LT/pain left  arm/leg. Motor: RUE 4+/5 proximal to distal RLE 4+/5 HF, KE and ADF/APF LUE: 4/5 delt, bicep,tricep, wrist, hand---mild PD, decreased FMC LLE hip flexion 4-/5 (previous hip surgery), ankle dorsi/plantar flexion 4/5  Skin: Skin is warm and dry.  Psychiatric: She has a normal mood and affect. Her behavior is normal. Judgment normal  Assessment/Plan: 1. 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 Physiatrist is providing close team supervision and 24 hour management of active medical problems listed below. Physiatrist and rehab team continue to assess barriers to discharge/monitor patient progress toward functional and medical goals.  Function:  Bathing Bathing position   Position: Shower  Bathing parts Body parts bathed by patient: Right arm, Left arm, Chest, Abdomen, Front perineal area, Buttocks, Right upper leg, Left upper leg, Right lower leg, Left lower leg Body parts bathed by helper: Back  Bathing assist Assist Level: Touching or steadying assistance(Pt > 75%)      Upper Body Dressing/Undressing Upper body dressing   What is the patient wearing?: Pull over shirt/dress, Bra Bra - Perfomed by patient: Thread/unthread right bra strap, Thread/unthread left bra strap, Hook/unhook bra (pull down sports bra)   Pull over shirt/dress - Perfomed by patient: Thread/unthread right sleeve, Thread/unthread left sleeve, Put head through opening, Pull shirt over trunk          Upper body assist Assist Level: Supervision or verbal cues   Set up : To obtain clothing/put away  Lower Body Dressing/Undressing Lower body dressing   What is  the patient wearing?: Pants, Underwear, Shoes Underwear - Performed by patient: Thread/unthread right underwear leg, Thread/unthread left underwear  leg, Pull underwear up/down Underwear - Performed by helper: Thread/unthread right underwear leg, Thread/unthread left underwear leg, Pull underwear up/down Pants- Performed by patient: Thread/unthread right pants leg, Thread/unthread left pants leg, Pull pants up/down Pants- Performed by helper: Thread/unthread right pants leg, Thread/unthread left pants leg, Pull pants up/down Non-skid slipper socks- Performed by patient: Don/doff right sock, Don/doff left sock Non-skid slipper socks- Performed by helper: Don/doff right sock, Don/doff left sock     Shoes - Performed by patient: Don/doff right shoe, Don/doff left shoe Shoes - Performed by helper: Don/doff right shoe, Don/doff left shoe, Fasten right, Fasten left          Lower body assist Assist for lower body dressing: Touching or steadying assistance (Pt > 75%)      Toileting Toileting   Toileting steps completed by patient: Adjust clothing prior to toileting, Performs perineal hygiene, Adjust clothing after toileting   Toileting Assistive Devices: Grab bar or rail  Toileting assist Assist level: Supervision or verbal cues   Transfers Chair/bed transfer   Chair/bed transfer method: Stand pivot Chair/bed transfer assist level: Supervision or verbal cues Chair/bed transfer assistive device: Armrests     Locomotion Ambulation     Max distance: 150 Assist level: Touching or steadying assistance (Pt > 75%) (steady assist faded to supervision)   Wheelchair   Type: Manual Max wheelchair distance: 150 Assist Level: No help, No cues, assistive device, takes more than reasonable amount of time  Cognition Comprehension Comprehension assist level: Understands complex 90% of the time/cues 10% of the time  Expression Expression assist level: Expresses complex 90% of the time/cues < 10% of the time  Social Interaction Social Interaction assist level: Interacts appropriately with others with medication or extra time (anti-anxiety,  antidepressant).  Problem Solving Problem solving assist level: Solves complex 90% of the time/cues < 10% of the time  Memory Memory assist level: Recognizes or recalls 75 - 89% of the time/requires cueing 10 - 24% of the time   Medical Problem List and Plan: 1. Left hemiparesis and balance deficits secondary to hypertensive right thalamic and internal capsule hemorrhages  -team conference today--pt with a lot of anxiety over discharge---wants to maintain her autonomy.  2. DVT Prophylaxis/Anticoagulation: SCDs. Encourage mobility 3. Pain Management: Tylenol as needed  -reduce zanaflex to '1mg'$   -continue voltaren gel for bilateral knees (OA) 4. Mood/anxiety. Ativan 1 mg 3 times a day as needed (in place of non-formulary serax she uses at home)  Provide emotional support  - trazodone '25mg'$  qhs to help with sleep 5. Neuropsych: This patient is capable of making decisions on her own behalf. 6. Skin/Wound Care: Routine skin checks 7. Fluids/Electrolytes/Nutrition: intake reasonable.  8. Hypertension. Hydrochlorothiazide 12.5 mg daily, Toprol-XL 25 mg twice a day. Monitor with increased mobility 9. Diastolic congestive heart failure. Monitor for any signs of fluid overload. Needs a weight! 10. Multiple left hip surgeries.is a long term fall risk 11. GERD: protonix   LOS (Days) 6 A FACE TO FACE EVALUATION WAS PERFORMED a SWARTZ,ZACHARY T 11/02/2015 9:10 AM

## 2015-11-02 NOTE — Progress Notes (Signed)
Occupational Therapy Session Note  Patient Details  Name: Theresa Harris MRN: [id number] Date of Birth: November 08, 1927  Today's Date: 11/02/2015 OT Individual Time: 0700-0828 OT Individual Time Calculation (min): 88 min    Short Term Goals: Week 1:  OT Short Term Goal 1 (Week 1): STG= LTG  Skilled Therapeutic Interventions/Progress Updates:  Upon entering the room, pt supine in bed sleeping in bed. Pt reports 7/10 c/o pain in L knee this session with sit <>stand and ambulating. Pt ambulating with close supervision and use of RW into bathroom. Supervision for toilet transfer, clothing management, and hygiene this session. Pt ambulating with RW for shower transfer with min verbal cues to stay within RW. Pt performing bathing with steady assist for sit <>stand when washing buttocks and peri area. Pt ambulating to obtain clothing items from bed and drape over RW to transport to chair with supervision.Pt dressing from stationary chair with steady assist for LB clothing management and supervision for UB self care. Pt remain in this chair for breakfast tray with call bell and all needed items within reach upon exiting the room.   Therapy Documentation Precautions:  Precautions Precautions: Fall Restrictions Weight Bearing Restrictions: No General:   Vital Signs: Therapy Vitals Temp: 97.8 F (36.6 C) Temp Source: Oral Pulse Rate: 67 Resp: 18 BP: (!) 148/52 mmHg Patient Position (if appropriate): Lying Oxygen Therapy SpO2: 98 % O2 Device: Not Delivered  See Function Navigator for Current Functional Status.   Therapy/Group: Individual Therapy  Phineas Semen 11/02/2015, 7:55 AM

## 2015-11-02 NOTE — Plan of Care (Signed)
Problem: RH Bathing Goal: LTG Patient will bathe with assist, cues/equipment (OT) LTG: Patient will bathe specified number of body parts with assist with/without cues using equipment (position) (OT)  Downgraded secondary to safety  Problem: RH Dressing Goal: LTG Patient will perform lower body dressing w/assist (OT) LTG: Patient will perform lower body dressing with assist, with/without cues in positioning using equipment (OT)  Downgraded secondary to safety  Problem: RH Tub/Shower Transfers Goal: LTG Patient will perform tub/shower transfers w/assist (OT) LTG: Patient will perform tub/shower transfers with assist, with/without cues using equipment (OT)  Downgraded secondary to safety  Problem: RH Memory Goal: LTG Patient will demonstrate ability for day to day (OT) LTG: Patient will demonstrate ability for day to day recall/carryover during activities of daily living with assist (OT)  Downgraded secondary to safety  Problem: RH Simple Meal Prep Goal: LTG Patient will perform simple meal prep w/assist (OT) LTG: Patient will perform simple meal prep with assistance, with/without cues (OT).  Downgraded secondary to safety

## 2015-11-02 NOTE — Consult Note (Signed)
NEUROCOGNITIVE TESTING - [facility]   MEDICAL NECESSITY:  Theresa Harris was seen on the [facility] for neurocognitive testing owing to the patient's diagnosis of intracerebral hemorrhage.   Records indicate that Theresa Harris is an "81 y.o. right handed female with history of hypertension, diastolic congestive heart failure, [and] spinal stenosis. Patient lives alone in [place], [place].Presented to [facility] 10/22/2015 after a fall from a step stool landing on her left side. Blood pressure 188/45. CT of the head showed a small parenchymal hemorrhage involving right thalamus internal capsule measuring 14 x 9 mm with extension of hemorrhage into the right lateral ventricle without hydrocephalus. Incidental finding of 15 mm right frontal meningioma. She was transferred to [facility] for further evaluation. Patient did not receive TPA secondary to [place]. Echocardiogram with ejection fraction 65% no wall motion abnormalities.. Carotid Dopplers with right 40-59% ICA stenosis. Neurology consulted with the advice of close monitoring of blood pressure. Tolerating a regular consistency diet. Physical and occupational therapy evaluations completed 10/23/2015 with recommendations of physical medicine rehabilitation consult. Patient was admitted for a comprehensive rehabilitation program."   During today's visit, Theresa Harris reported experiencing slowed processing speed, increased distractibility, and a tendency to lose her train of thought post-intracerebral hemorrhage. She also reported left facial numbness that has resolved. No other cognitive, motor or sensory symptoms endorsed.   From an emotional standpoint, Theresa Harris said that she had a "couple cry days" that she attributed to her not receiving her anxiolytic. She has a history of being treated for anxious symptomology for several years via a  benzodiazepine. She commonly takes this medication twice a day and is presently receiving Ativan on the rehabilitation unit, though she wishes they would give it to her earlier in the day. She finds this medication to be beneficial. She has not been treated for mood in any other capacity. No adjustment issues endorsed. Suicidal/homicidal ideation, plan or intent was denied. No manic or hypomanic episodes were reported. The patient denied ever experiencing any auditory/visual hallucinations. No major behavioral or personality changes were endorsed.   Patient feels that progress is being made in therapy. No barriers to therapy identified. She is reportedly "amazed" by the rehabilitation team and has confidence in her treatment team. She was living independently and has a supportive family that includes one sister in [place]. However, most of her children live fairly far away from her.   The patient was referred for neuropsychological consultation given the possibility of cognitive sequelae subsequent to the current medical status and in order to assist in treatment planning.   PROCEDURES: [3 units of 96118]  Diagnostic Interview Medical record review Behavioral observations  Neuropsychological testing  [place]'s Cognitive Examination - ACE-III  SUMMARY & IMPRESSION: Test results revealed generally intact functioning across most cognitive domains and thinking skills assessment during this evaluation with the exception of reduced aspects of attention, processing speed, and constructional praxis. Orientation, language, visuospatial abilities, and most aspects of memory were intact. From an emotional standpoint, Theresa Harris is a long-standing benzodiazepine user for anxious symptomology. With this medication, mood is well-controlled.  Theresa Harris' performance is consistent with a diagnosis of Mild Neurocognitive Disorder (i.e., mild cognitive impairment); predominately dysexecutive (most likely  secondary to intracerebral hemorrhage). It is my hope that with time to recover that her thinking skills will improve. I will plan to monitor this via repeat testing as an outpatient.  Of note, the patient spent a good  deal of time expressing her desire to be better informed about her medical status (e.g., information on her blood pressure readings and certain tests that have been performed). For example, she mentioned that she had a carotid artery tests done and she has not received the results yet. She wishes for the rehabilitation staff to know that she wants to be well informed and updated more consistently. I said that I would mention this to her treatment team.   RECOMMENDATIONS  Recommendations for treatment team:  . Patient wishes to be more frequently updated about her medical status (e.g., blood pressure readings and test results). Please be aware of this desire and comply with her request as much as possible. . For anxiety, continue benzodiazepine use (as sparingly as possible).  . To the extent possible, multitasking should be avoided. . She requires more time than typical to process information. The treatment team may benefit from waiting for a verbal response to information before presenting additional information.  . Performance will generally be best in a structured, routine, and familiar environment, as opposed to situations involving complex problems.  . Frequent reorientation will be helpful . Establish consistent daily routines . Use of short treatment sessions . Attend carefully to basic physiologic needs (e.g., nutrition, toileting, sleep, etc.) . Maintain as much as possible a quiet treatment environment . Avoid overstimulation . Patient does appear to be able to make the most appropriate/competent decisions regarding her care.    Recommendations for discharge planning:  . Complete a comprehensive neuropsychological evaluation as an outpatient in 8-12 months to assess for  interval change. This can be done through Norton Pastel, PsyD by calling the following number: [phone].  . Establish long-term follow-up care with a provider knowledgeable in stroke.  [facility] Neuroimaging can show time-dependent vulnerability to damage in specific regions, and lesions often evolve ever weeks or months. As such, repeat head CT or brain MRI in 2-3 months might be beneficial.   . Maintain engagement in mentally, physically and cognitively stimulating activities.  . Strive to maintain a healthy lifestyle (e.g., proper diet and exercise) in order to promote physical, cognitive and emotional health.  . Due to the nature and severity of the symptoms noted during this evaluation, it is recommended that she initially obtain constant care and supervision following this hospitalization.  . The patient should refrain from driving at this time.       Rutha Bouchard, Psy.D.  Clinical Neuropsychologist  Rehabilitation Psychologist

## 2015-11-03 ENCOUNTER — Inpatient Hospital Stay (HOSPITAL_COMMUNITY): Payer: PPO | Admitting: Physical Therapy

## 2015-11-03 ENCOUNTER — Inpatient Hospital Stay (HOSPITAL_COMMUNITY): Payer: PPO | Admitting: Occupational Therapy

## 2015-11-03 MED ORDER — TIZANIDINE HCL 2 MG PO TABS
1.0000 mg | ORAL_TABLET | Freq: Every day | ORAL | Status: DC
  Administered 2015-11-03 – 2015-11-05 (×3): 1 mg via ORAL
  Filled 2015-11-03 (×3): qty 1

## 2015-11-03 MED ORDER — LORAZEPAM 1 MG PO TABS
1.0000 mg | ORAL_TABLET | Freq: Two times a day (BID) | ORAL | Status: DC | PRN
  Administered 2015-11-04 – 2015-11-06 (×3): 1 mg via ORAL
  Filled 2015-11-03 (×3): qty 1

## 2015-11-03 MED ORDER — LORAZEPAM 1 MG PO TABS
1.0000 mg | ORAL_TABLET | Freq: Every day | ORAL | Status: DC
  Administered 2015-11-03 – 2015-11-05 (×3): 1 mg via ORAL
  Filled 2015-11-03 (×3): qty 1

## 2015-11-03 NOTE — Plan of Care (Signed)
Problem: RH Balance Goal: LTG Patient will maintain dynamic standing balance (PT) LTG: Patient will maintain dynamic standing balance with assistance during mobility activities (PT)  Downgraded due to progress with PT  Problem: RH Car Transfers Goal: LTG Patient will perform car transfers with assist (PT) LTG: Patient will perform car transfers with assistance (PT).  Downgraded due to ongoing deficits in balance and safety awareness  Problem: RH Ambulation Goal: LTG Patient will ambulate in controlled environment (PT) LTG: Patient will ambulate in a controlled environment, # of feet with assistance (PT).  Downgraded due to ongoing deficits in balance and safety awareness Goal: LTG Patient will ambulate in home environment (PT) LTG: Patient will ambulate in home environment, # of feet with assistance (PT).  Downgraded due to ongoing deficits in balance and safety awareness  Problem: RH Stairs Goal: LTG Patient will ambulate up and down stairs w/assist (PT) LTG: Patient will ambulate up and down # of stairs with assistance (PT)  Downgraded due to ongoing knee pain and deficits in balance and safety awareness

## 2015-11-03 NOTE — Progress Notes (Signed)
Physical Therapy Session Note  Patient Details  Name: Theresa Harris MRN: [id number] Date of Birth: June 21, 1927  Today's Date: 11/03/2015 PT Individual Time: 1100-1158 and 1500-1541 PT Individual Time Calculation (min): 58 min and 41 min   Short Term Goals: Week 1:  PT Short Term Goal 1 (Week 1): =LTGs due to ELOS  Skilled Therapeutic Interventions/Progress Updates:   Session 1: Pt received resting in w/c and agreeable to therapy session.  Session focus on LE strengthening, standing balance, endurance, and gait training.  Pt propelled w/c x150' to therapy gym using BUEs and BLEs interchangeably.  PT instructed patient in OTAGO A level exercises x10 reps/seconds: LAQ, hamstring curls, mini-squats, standing hip abd, 5x sit<>stand, and step ahead (in lieu of tandem stance) with R and L foot.  Pt performed side stepping x40' to R and to L with verbal cues for correct form.  Pt amb x125' back towards room and requesting to toilet.  Pt performed toileting transfer/tasks with overall distant supervision.  Pt positioned in recliner at end of session with call bell in reach and needs met.   Session 2: Pt received in w/c and agreeable to therapy session.  Session focus on gait training with RW, stair negotiation, curb negotiation, and ramp negotiation with RW.  Pt amb x175' with RW and supervision to therapy gym with more than a reasonable amount of time.  PT instructed patient in 6" step negotiation x4 steps with L hand rail ascend forwards with HHA and descend sideways with BUE support on L rail.  Pt requires min assist on stairs to maintain balance.  PT instructed patient in negotiation of ramp and curb descent with RW and supervision with verbal cues for sequencing.  Representative from [place] in to fit patient for knee brace at end of session.  Pt performed stand/pivot transfer from therapy mat to w/c with RW and supervision.  PT transported patient back to room for energy conservation and pt transferred  back to bed stand/pivot with supervision.  Pt left supine in bed with call bell in reach and needs met.   Therapy Documentation Precautions:  Precautions Precautions: Fall Restrictions Weight Bearing Restrictions: No Pain: Pain Assessment Pain Assessment: 0-10 Pain Score: 5  Pain Location: Knee Pain Orientation: Left Pain Intervention(s): Emotional support;Ambulation/increased activity  See Function Navigator for Current Functional Status.   Therapy/Group: Individual Therapy  Earnest Conroy Penven-Crew 11/03/2015, 12:26 PM

## 2015-11-03 NOTE — Plan of Care (Signed)
Problem: RH Toileting Goal: LTG Patient will perform toileting w/assist, cues/equip (OT) LTG: Patient will perform toiletiing (clothes management/hygiene) with assist, with/without cues using equipment (OT)  Downgraded secondary to pt safety  Problem: RH Toilet Transfers Goal: LTG Patient will perform toilet transfers w/assist (OT) LTG: Patient will perform toilet transfers with assist, with/without cues using equipment (OT)  Downgraded secondary to pt safety

## 2015-11-03 NOTE — Plan of Care (Signed)
Problem: RH Dressing Goal: LTG Patient will perform upper body dressing (OT) LTG Patient will perform upper body dressing with assist, with/without cues (OT).  Downgraded secondary to pt safety

## 2015-11-03 NOTE — Progress Notes (Signed)
Occupational Therapy Session Note  Patient Details  Name: Theresa Harris MRN: [id number] Date of Birth: 1927/02/17  Today's Date: 11/03/2015 OT Individual Time: 1000-1100 and 1400-1445 OT Individual Time Calculation (min): 60 min and 45 min    Short Term Goals: Week 1:  OT Short Term Goal 1 (Week 1): STG= LTG  Skilled Therapeutic Interventions/Progress Updates:  Session 1: Upon entering the room, pt supine in bed sleeping with 4/10 c/o pain in L knee this session. Pt ambulating into bathroom with RW for bathing at shower level. Pt requires steady assist for standing in order to wash buttocks and peri area. Pt returned to sit on EOB to don clothing items with min verbal cues to keep RW within reach. Pt grooming tasks while seated in wheelchair at sink side with mod I. Pt remaining in wheelchair at end of session with call bell and all needed items within reach.   Session 2: Upon entering the room, pt supine in bed. NT reporting that pt had transferred self into bed. OT educated pt on how she must have assistance for transfers and ambulation within the room. Pt verbally argumentative to therapist about this topic. Pt transferred to wheelchair from bed with supervision and use of RW. Pt propelled wheelchair with B LEs and UEs endurance and time management to ADL apartment. OT educated and demonstrated ambulating into bathroom with RW and transfer onto TTB. Pt returned demonstration with close supervision. OT recommended pt remove mat from inside of tub and place safety treads in order to decrease fall risk.OT educated pt to not stand during shower and instead utilize lateral leans in order to wash buttocks. Pt also making bed without use of RW as pt reports she is unable to get RW all the way around bed. Pt requiring close supervision -steady assist with task. Min verbal cues required for safety. Pt transferring back into wheelchair and transported back to room for time management. Pt remained in chair  with call bell within reach for next therapist.  Therapy Documentation Precautions:  Precautions Precautions: Fall Restrictions Weight Bearing Restrictions: No   Pain: Pain Assessment Pain Assessment: 0-10 Pain Score: 5  Pain Location: Knee Pain Orientation: Left Pain Intervention(s): Emotional support;Ambulation/increased activity  See Function Navigator for Current Functional Status.   Therapy/Group: Individual Therapy  Phineas Semen 11/03/2015, 12:50 PM

## 2015-11-03 NOTE — Plan of Care (Signed)
Problem: RH Simple Meal Prep Goal: LTG Patient will perform simple meal prep w/assist (OT) LTG: Patient will perform simple meal prep with assistance, with/without cues (OT).  Outcome: Not Applicable Date Met:  76/28/31 Goal discharged as pt requests to no longer address. Pt reports, "Someone will do this for me when I go home. I don't feel safe doing it."

## 2015-11-03 NOTE — Progress Notes (Signed)
Orthopedic Tech Progress Note Patient Details:  Theresa Harris 1927-05-11 [id number]  Patient ID: Theresa Harris, female   DOB: 1926/12/26, 81 y.o.   MRN: [id number] Called in advanced brace order; spoke with Dyann Kief, Verna Hamon 11/03/2015, 9:42 AM

## 2015-11-03 NOTE — Progress Notes (Signed)
[facility] PHYSICAL MEDICINE & REHABILITATION     PROGRESS NOTE    Subjective/Complaints: Left "knee jerks" sometimes at night, is painful, gives way when walking on it. When zanaflex and ativan are not given together they don't work for her.   ROS: Pt denies fever, rash/itching, headache, blurred or double vision, nausea, vomiting, abdominal pain, diarrhea, chest pain, , palpitations, dysuria, dizziness, neck or back pain, bleeding,   or depression   Objective: Vital Signs: Blood pressure 160/72, pulse 67, temperature 97.7 F (36.5 C), temperature source Oral, resp. rate 17, height 5' (1.524 m), weight 52.6 kg (115 lb 15.4 oz), SpO2 98 %. No results found. No results for input(s): WBC, HGB, HCT, PLT in the last 72 hours. No results for input(s): NA, K, CL, GLUCOSE, BUN, CREATININE, CALCIUM in the last 72 hours.  Invalid input(s): CO CBG (last 3)  No results for input(s): GLUCAP in the last 72 hours.  Wt Readings from Last 3 Encounters:  11/03/15 52.6 kg (115 lb 15.4 oz)  10/22/15 58.7 kg (129 lb 6.6 oz)  10/22/15 57.153 kg (126 lb)    Physical Exam:  Constitutional: She is oriented to person, place, and time. She appears well-developed and well-nourished.  HENT:  Head: Normocephalic.  Right Ear: External ear normal.  Left Ear: External ear normal.  Eyes: Conjunctivae and EOM are normal.  Neck: Normal range of motion. Neck supple. No thyromegaly present.  Cardiovascular: Normal rate and regular rhythm.  Respiratory: Effort normal and breath sounds normal. No respiratory distress.  GI: Soft. Bowel sounds are normal. She exhibits no distension.  Musculoskeletal: She exhibits tenderness. She exhibits no edema. Reduced Left hip ROM- mainly flexion restricted, Valgus deformities bilateral knees--resists knee examination by activating quad. No gross effusion seen Neurological: She is alert and oriented to person, place, and time.  Alert and oriented 3. Follows  commands.  Fair awareness of deficits. Very verbose, tangential at times. DTRs 3+ LUE/LLE Decreased LT/pain left arm/leg. Motor: RUE 4+/5 proximal to distal RLE 4+/5 HF, KE and ADF/APF LUE: 4/5 delt, bicep,tricep, wrist, hand---mild PD, decreased FMC LLE hip flexion 4-/5 (previous hip surgery), ankle dorsi/plantar flexion 4/5  Skin: Skin is warm and dry.  Psychiatric: She has a normal mood and affect. Her behavior is normal. Judgment normal  Assessment/Plan: 1. 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 Physiatrist is providing close team supervision and 24 hour management of active medical problems listed below. Physiatrist and rehab team continue to assess barriers to discharge/monitor patient progress toward functional and medical goals.  Function:  Bathing Bathing position   Position: Shower  Bathing parts Body parts bathed by patient: Right arm, Left arm, Chest, Abdomen, Front perineal area, Buttocks, Right upper leg, Left upper leg, Right lower leg, Left lower leg Body parts bathed by helper: Back  Bathing assist Assist Level: Touching or steadying assistance(Pt > 75%)      Upper Body Dressing/Undressing Upper body dressing   What is the patient wearing?: Pull over shirt/dress, Bra Bra - Perfomed by patient: Thread/unthread right bra strap, Thread/unthread left bra strap, Hook/unhook bra (pull down sports bra)   Pull over shirt/dress - Perfomed by patient: Thread/unthread right sleeve, Thread/unthread left sleeve, Put head through opening, Pull shirt over trunk          Upper body assist Assist Level: Supervision or verbal cues  Set up : To obtain clothing/put away  Lower Body Dressing/Undressing Lower body dressing   What is the patient wearing?: Pants,  Underwear, Shoes Underwear - Performed by patient: Thread/unthread right underwear leg, Thread/unthread left underwear leg, Pull underwear up/down Underwear - Performed by helper: Thread/unthread right underwear leg, Thread/unthread left underwear leg, Pull underwear up/down Pants- Performed by patient: Thread/unthread right pants leg, Thread/unthread left pants leg, Pull pants up/down Pants- Performed by helper: Thread/unthread right pants leg, Thread/unthread left pants leg, Pull pants up/down Non-skid slipper socks- Performed by patient: Don/doff right sock, Don/doff left sock Non-skid slipper socks- Performed by helper: Don/doff right sock, Don/doff left sock     Shoes - Performed by patient: Don/doff right shoe, Don/doff left shoe Shoes - Performed by helper: Don/doff right shoe, Don/doff left shoe, Fasten right, Fasten left          Lower body assist Assist for lower body dressing: Touching or steadying assistance (Pt > 75%)      Toileting Toileting   Toileting steps completed by patient: Adjust clothing prior to toileting, Performs perineal hygiene   Toileting Assistive Devices: Grab bar or rail  Toileting assist Assist level: Supervision or verbal cues   Transfers Chair/bed transfer   Chair/bed transfer method: Ambulatory Chair/bed transfer assist level: Supervision or verbal cues Chair/bed transfer assistive device: Armrests, Medical sales representative     Max distance: 150 Assist level: Supervision or verbal cues   Wheelchair   Type: Manual Max wheelchair distance: 100 Assist Level: No help, No cues, assistive device, takes more than reasonable amount of time  Cognition Comprehension Comprehension assist level: Understands complex 90% of the time/cues 10% of the time  Expression Expression assist level: Expresses complex 90% of the time/cues < 10% of the time  Social Interaction Social Interaction assist level: Interacts appropriately with others with  medication or extra time (anti-anxiety, antidepressant).  Problem Solving Problem solving assist level: Solves complex 90% of the time/cues < 10% of the time  Memory Memory assist level: Recognizes or recalls 75 - 89% of the time/requires cueing 10 - 24% of the time   Medical Problem List and Plan: 1. Left hemiparesis and balance deficits secondary to hypertensive right thalamic and internal capsule hemorrhages  -team conference today--pt with a lot of anxiety over discharge---wants to maintain her autonomy.  2. DVT Prophylaxis/Anticoagulation: SCDs. Encourage mobility 3. Pain Management: Tylenol as needed  -reduce zanaflex to '1mg'$   -continue voltaren gel for bilateral knees (OA)  -have asked [employer] to bring patient a neoprene knee sleeve with lateral supports to better control knee in stance, pain relief  -consider steroid injection left knee? 4. Mood/anxiety. Ativan 1 mg 3 times a day as needed (in place of non-formulary serax she uses at home)  Provide emotional support  -will schedule ativan and zanaflex to help with sleep as that is what she uses at home. 5. Neuropsych: This patient is capable of making decisions on her own behalf. 6. Skin/Wound Care: Routine skin checks 7. Fluids/Electrolytes/Nutrition: intake reasonable.  8. Hypertension. Hydrochlorothiazide 12.5 mg daily, Toprol-XL 25 mg twice a day. Monitor with increased mobility 9. Diastolic congestive heart failure. Monitor for any signs of fluid overload. Weight decreased from admit. Clinically looks euvolemic 10. Multiple left hip surgeries.is a long term fall risk 11. GERD: protonix   LOS (Days) 7 A FACE TO FACE EVALUATION WAS PERFORMED a Sarra Rachels T 11/03/2015 8:58 AM

## 2015-11-04 ENCOUNTER — Inpatient Hospital Stay (HOSPITAL_COMMUNITY): Payer: PPO | Admitting: Occupational Therapy

## 2015-11-04 ENCOUNTER — Inpatient Hospital Stay (HOSPITAL_COMMUNITY): Payer: PPO | Admitting: Physical Therapy

## 2015-11-04 NOTE — Progress Notes (Signed)
Occupational Therapy Session Note  Patient Details  Name: Theresa Harris MRN: [id number] Date of Birth: Jun 18, 1927  Today's Date: 11/04/2015 OT Individual Time: 1300-1325 OT Individual Time Calculation (min): 25 min  and Today's Date: 11/04/2015 OT Missed Time: 35 Minutes Missed Time Reason: Patient fatigue   Short Term Goals: Week 1:  OT Short Term Goal 1 (Week 1): STG= LTG  Skilled Therapeutic Interventions/Progress Updates:  Upon entering the room, pt on toilet with NT present in room. Pt performing hygiene and clothing management with supervision and ambulating out of bathroom with RW. Pt standing to wash hands with supervision as well. Pt seated on EOB and reporting continued feeling of illness. Pt stating, "I just don't feel good. I can't do this right now." OT educated pt on discharge recommendations and answered and questions pt had regarding HHOT recommendation. Pt returned to supine in bed with call bell and all needed items within reach upon exiting the room.   Therapy Documentation Precautions:  Precautions Precautions: Fall Restrictions Weight Bearing Restrictions: No General: General OT Amount of Missed Time: 66 Minutes PT Missed Treatment Reason: Patient fatigue;Patient ill (Comment) (nausea, improved with sprite, but continuing to report fatigue and declining further therapy)   See Function Navigator for Current Functional Status.   Therapy/Group: Individual Therapy  Phineas Semen 11/04/2015, 1:36 PM

## 2015-11-04 NOTE — Patient Care Conference (Signed)
Inpatient RehabilitationTeam Conference and Plan of Care Update Date: 11/02/2015   Time: 2:30 PM    Patient Name: Theresa Harris      Medical Record Number: [id number]  Date of Birth: [birth year] Sex: Female         Room/Bed: 4W19C/4W19C-01 Payor Info: Payor: [employer] / Plan: [employer] / Product Type: *No Product type* /    Admitting Diagnosis: rt traumatic ICH  Admit Date/Time:  10/27/2015  6:08 PM Admission Comments: No comment available   Primary Diagnosis:  ICH (intracerebral hemorrhage) ([place]) Principal Problem: ICH (intracerebral hemorrhage) ([place])  Patient Active Problem List   Diagnosis Date Noted  . Nontraumatic subcortical hemorrhage of right cerebral hemisphere ([place])   . Chronic diastolic congestive heart failure ([place]) 10/28/2015  . Essential hypertension 10/28/2015  . Ataxia   . Sensation alteration, late effect of cerebrovascular disease   . Aneurysm, cerebral, nonruptured 10/26/2015  . Meningioma ([place]) 10/26/2015  . ICH (intracerebral hemorrhage) ([place]) 10/22/2015    Expected Discharge Date: Expected Discharge Date: 11/06/15  Team Members Present: Physician leading conference: Dr. Alger Simons Social Worker Present: Lennart Pall, LCSW Nurse Present: Dorien Chihuahua, RN PT Present: Dwyane Dee, PT;Rebecca Jari Favre, PT OT Present: Benay Pillow, OT;Roanna Epley, COTA SLP Present: Weston Anna, SLP PPS Coordinator present : Daiva Nakayama, RN, CRRN     Current Status/Progress Goal Weekly Team Focus  Medical   improved mobility but has basline ortho deficits. persistent sensory loss on left side.  improve safety awareness, manage pain  sleep, pain control, nutrition   Bowel/Bladder   continent bowel and bladder   mod I   continue with plan of care    Swallow/Nutrition/ Hydration             ADL's   supervision-min A for self care overall, steady assist for functional transfers, decreased safety awareness  mod I    balance,safety,education, self care   Mobility   supervision to steady assist  mod I transfers, supervision car transfers and gait, min assist stairs  LE strengthening, dynamic standing balance, safety awareness, progression of independence with mobility   Communication             Safety/Cognition/ Behavioral Observations            Pain   bilateral knees and right hip voltaren gel  decrease pain level after votaren gel   monitor effecitveness of voltaren gel   Skin   small scattered scabs to shoulders  and chest   no new breakdown   educate patient on skin care and prevention of breakdown     Rehab Goals Patient on target to meet rehab goals: Yes *See Care Plan and progress notes for long and short-term goals.  Barriers to Discharge: pt very independent, doesn't seek "help"    Possible Resolutions to Barriers:  ?hired help once home, family to provide some assist? use of adaptive equipment    Discharge Planning/Teaching Needs:  Plans to d/c home and hire private duty care via her LTC insurance.  Family to provide intermittent support.      Team Discussion:  Making good progress toward supervision goals.  Team recommends 24/7 supervision as she is a fall risk at home.  SW to follow up with pt/family  Revisions to Treatment Plan:  Most initial goals downgraded to supervision.   Continued Need for Acute Rehabilitation Level of Care: The patient requires daily medical management by a physician with specialized training in physical medicine and rehabilitation for the  following conditions: Daily direction of a multidisciplinary physical rehabilitation program to ensure safe treatment while eliciting the highest outcome that is of practical value to the patient.: Yes Daily analysis of laboratory values and/or radiology reports with any subsequent need for medication adjustment of medical intervention for : Neurological problems;Post surgical problems  Theresa Harris 11/04/2015, 11:24 AM

## 2015-11-04 NOTE — Progress Notes (Signed)
Social Work Patient ID: Theresa Harris, female   DOB: [birth year], 81 y.o.   MRN: [id number]   Met yesterday with pt and spoke with daughters via phone to review team conference and recommendations for d/c.  Daughters understand team is recommending 24/7 supervision and daughter, Loletha Carrow, states, "I'm not surprised.  She just keeps doing and doing and now she has finally reached that point."  Pt upset that I spoke with her daughters and states that she "...didn't want to put any more on them..they have too much going on...".  Explained to pt that family needs to be involved and that daughter had contacted me initially.  Discussion with all about pt's LTC policy and arranging of 24/7 care.  Daughters report that they are planning to coordinate family to initially be the provider of the 24/7 care as they interview agencies and set up services.  All are aware and agreed with plan for 12/10 d/c.  Daughter, [name], will be the person to pick up.  I am also setting up f/u [facility] services.  Continue to follow.  Kenniya Westrich, LCSW

## 2015-11-04 NOTE — Progress Notes (Signed)
Occupational Therapy Session Note  Patient Details  Name: Theresa Harris MRN: [id number] Date of Birth: [birth year]  Today's Date: 11/04/2015 OT Individual Time: [phone] and OT Individual Time Calculation (min): 60 min and   Short Term Goals: Week 1:  OT Short Term Goal 1 (Week 1): STG= LTG  Skilled Therapeutic Interventions/Progress Updates:  Session 1: Upon entering the room, pt seated on toilet with NT present in the room. Pt reporting, "This is the 3rd time this morning I have been on the toilet." Pt also reports feeling "weak" this morning. Pt requiring coaxing for OT intervention this session. Pt performed clothing management and hygiene with supervision and increased time.  Pt ambulated with use of RW and supervision from toilet into walk in shower and seated on shower chair for bathing. Pt required steady assistance when standing to wash buttocks. After bathing, pt ambulated to wheelchair and engaged in dressing with sit <>stand for tasks. Pt holding onto sink for balance during LB clothing management. Pt required frequent rest breaks secondary to fatigue and pt feeling unwell. Pt remained seated in wheelchair as OT exited the room and RN arrived with medications.      Therapy Documentation Precautions:  Precautions Precautions: Fall Restrictions Weight Bearing Restrictions: No  See Function Navigator for Current Functional Status.   Therapy/Group: Individual Therapy  Phineas Semen 11/04/2015, 12:03 PM

## 2015-11-04 NOTE — Progress Notes (Signed)
Physical Therapy Session Note  Patient Details  Name: Theresa Harris MRN: [id number] Date of Birth: Sep 11, 1927  Today's Date: 11/04/2015 PT Individual Time: 1030-1100 PT Individual Time Calculation (min): 30 min  and  Today's Date: 11/04/2015 PT Missed Time: 60 Minutes Missed Time Reason: Patient fatigue;Patient ill (Comment) (nausea, improved with sprite, but continuing to report fatigue and declining further therapy)  Short Term Goals: Week 1:  PT Short Term Goal 1 (Week 1): =LTGs due to ELOS  Skilled Therapeutic Interventions/Progress Updates:    Pt received resting in bed, no c/o pain but reporting nausea and extreme fatigue.  Pt agreeable to toilet transfer with PT and sitting EOB for education donning/doffing knee brace.  Pt transitioned to sitting EOB mod I, sit<>stand with RW supervision and amb to bathroom with RW and supervision.  Pt performed toilet transfer/tasks with overall supervision, see function tab for details.  Pt returned to sitting EOB and PT demonstrated to patient how to don knee brace provided by [employer].  Pt transitioned to standing, again with supervision, and reports increased comfort and stability in knee with brace.  PT provided pt with sprite for upset stomach and pt reporting that she feels better but is "so exhausted" she declines further therapy at this time.  Pt left supine in bed with call bell in reach and needs met.  RN aware of missed time.    Therapy Documentation Precautions:  Precautions Precautions: Fall Restrictions Weight Bearing Restrictions: No General: PT Amount of Missed Time (min): 60 Minutes PT Missed Treatment Reason: Patient fatigue;Patient ill (Comment) (nausea, improved with sprite, but continuing to report fatigue and declining further therapy)   See Function Navigator for Current Functional Status.   Therapy/Group: Individual Therapy  Marylyn Appenzeller E Penven-Crew 11/04/2015, 11:26 AM

## 2015-11-04 NOTE — Progress Notes (Signed)
[place] PHYSICAL MEDICINE & REHABILITATION     PROGRESS NOTE    Subjective/Complaints: Concerned about higher bp this morning. Left leg still giving her "a time" at night.   ROS: Pt denies fever, rash/itching, headache, blurred or double vision, nausea, vomiting, abdominal pain, diarrhea, chest pain, , palpitations, dysuria, dizziness, neck or back pain, bleeding,   or depression   Objective: Vital Signs: Blood pressure 175/56, pulse 59, temperature 97.5 F (36.4 C), temperature source Oral, resp. rate 17, height 5' (1.524 m), weight 54.4 kg (119 lb 14.9 oz), SpO2 99 %. No results found. No results for input(s): WBC, HGB, HCT, PLT in the last 72 hours. No results for input(s): NA, K, CL, GLUCOSE, BUN, CREATININE, CALCIUM in the last 72 hours.  Invalid input(s): CO CBG (last 3)  No results for input(s): GLUCAP in the last 72 hours.  Wt Readings from Last 3 Encounters:  11/04/15 54.4 kg (119 lb 14.9 oz)  10/22/15 58.7 kg (129 lb 6.6 oz)  10/22/15 57.153 kg (126 lb)    Physical Exam:  Constitutional: She is oriented to person, place, and time. She appears well-developed and well-nourished.  HENT:  Head: Normocephalic.  Right Ear: External ear normal.  Left Ear: External ear normal.  Eyes: Conjunctivae and EOM are normal.  Neck: Normal range of motion. Neck supple. No thyromegaly present.  Cardiovascular: Normal rate and regular rhythm.  Respiratory: Effort normal and breath sounds normal. No respiratory distress.  GI: Soft. Bowel sounds are normal. She exhibits no distension.  Musculoskeletal: She exhibits tenderness. She exhibits no edema. Reduced Left hip ROM- mainly flexion restricted, Valgus deformities bilateral knees--resists knee examination by activating quad. No gross effusion seen Neurological: She is alert and oriented to person, place, and time.  Alert and oriented 3. Follows commands.  Fair awareness of deficits. Very verbose, tangential at  times. DTRs 3+ LUE/LLE Decreased LT/pain left arm/leg. Motor: RUE 4+/5 proximal to distal RLE 4+/5 HF, KE and ADF/APF LUE: 4/5 delt, bicep,tricep, wrist, hand---mild PD, decreased FMC LLE hip flexion 4-/5 (previous hip surgery), ankle dorsi/plantar flexion 4/5  Skin: Skin is warm and dry.  Psychiatric: She has a normal mood and affect. Her behavior is normal. Judgment normal  Assessment/Plan: 1. 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 Physiatrist is providing close team supervision and 24 hour management of active medical problems listed below. Physiatrist and rehab team continue to assess barriers to discharge/monitor patient progress toward functional and medical goals.  Function:  Bathing Bathing position   Position: Shower  Bathing parts Body parts bathed by patient: Right arm, Left arm, Chest, Abdomen, Front perineal area, Buttocks, Right upper leg, Left upper leg, Right lower leg, Left lower leg Body parts bathed by helper: Back  Bathing assist Assist Level: Touching or steadying assistance(Pt > 75%)      Upper Body Dressing/Undressing Upper body dressing   What is the patient wearing?: Pull over shirt/dress, Bra Bra - Perfomed by patient: Thread/unthread right bra strap, Thread/unthread left bra strap, Hook/unhook bra (pull down sports bra)   Pull over shirt/dress - Perfomed by patient: Thread/unthread right sleeve, Thread/unthread left sleeve, Put head through opening, Pull shirt over trunk          Upper body assist Assist Level: More than reasonable time   Set up : To obtain clothing/put away  Lower Body  Dressing/Undressing Lower body dressing   What is the patient wearing?: Pants, Underwear, Socks, Shoes Underwear - Performed by patient: Thread/unthread  right underwear leg, Thread/unthread left underwear leg, Pull underwear up/down Underwear - Performed by helper: Thread/unthread right underwear leg, Thread/unthread left underwear leg, Pull underwear up/down Pants- Performed by patient: Thread/unthread right pants leg, Thread/unthread left pants leg, Pull pants up/down Pants- Performed by helper: Thread/unthread right pants leg, Thread/unthread left pants leg, Pull pants up/down Non-skid slipper socks- Performed by patient: Don/doff right sock, Don/doff left sock Non-skid slipper socks- Performed by helper: Don/doff right sock, Don/doff left sock Socks - Performed by patient: Don/doff right sock, Don/doff left sock   Shoes - Performed by patient: Don/doff right shoe, Don/doff left shoe Shoes - Performed by helper: Don/doff right shoe, Don/doff left shoe, Fasten right, Fasten left          Lower body assist Assist for lower body dressing: Supervision or verbal cues      Toileting Toileting   Toileting steps completed by patient: Adjust clothing prior to toileting, Performs perineal hygiene, Adjust clothing after toileting   Toileting Assistive Devices: Grab bar or rail  Toileting assist Assist level: Supervision or verbal cues   Transfers Chair/bed transfer   Chair/bed transfer method: Stand pivot, Ambulatory Chair/bed transfer assist level: Supervision or verbal cues Chair/bed transfer assistive device: Environmental consultant, Air cabin crew     Max distance: 175 Assist level: Supervision or verbal cues   Wheelchair   Type: Manual Max wheelchair distance: 150 Assist Level: No help, No cues, assistive device, takes more than reasonable amount of time  Cognition Comprehension Comprehension assist level: Understands complex 90% of the time/cues 10% of the time  Expression Expression assist level: Expresses complex 90% of the time/cues < 10% of the time  Social Interaction Social Interaction assist level: Interacts  appropriately with others with medication or extra time (anti-anxiety, antidepressant).  Problem Solving Problem solving assist level: Solves complex 90% of the time/cues < 10% of the time  Memory Memory assist level: Recognizes or recalls 75 - 89% of the time/requires cueing 10 - 24% of the time   Medical Problem List and Plan: 1. Left hemiparesis and balance deficits secondary to hypertensive right thalamic and internal capsule hemorrhages  -working toward Saturday discharge.  2. DVT Prophylaxis/Anticoagulation: SCDs. Encourage mobility 3. Pain Management: Tylenol as needed  -reduced zanaflex to '1mg'$   -continue voltaren gel for bilateral knees (OA)  - [employer] to bring patient a neoprene knee sleeve with lateral supports to better control knee in stance, pain relief  -?left-sided neuropathic pain---really don't want to add an anticonvulsant---will address the knee first 4. Mood/anxiety. Ativan 1 mg 3 times a day as needed (in place of non-formulary serax she uses at home)  Provide emotional support  -will schedule ativan and zanaflex to help with sleep as that is what she uses at home. 5. Neuropsych: This patient is capable of making decisions on her own behalf. 6. Skin/Wound Care: Routine skin checks 7. Fluids/Electrolytes/Nutrition: intake reasonable.  8. Hypertension. Hydrochlorothiazide 12.5 mg daily, Toprol-XL 25 mg twice a day. Need to give metoprolol at HR of 50 or above 9. Diastolic congestive heart failure. Monitor for any signs of fluid overload. Weight decreased from admit. Clinically looks euvolemic 10. Multiple left hip surgeries.is a long term fall risk 11. GERD: protonix   LOS (Days) 8 A FACE TO FACE EVALUATION WAS PERFORMED a Theresa Harris T 11/04/2015 10:27 AM

## 2015-11-04 NOTE — Progress Notes (Signed)
Theresa Harris called for patient's atypical HR of 50. Metoprolol held, rechecked HR in an hour and it was 54. Metoprolol not given.will monitor.

## 2015-11-05 ENCOUNTER — Inpatient Hospital Stay (HOSPITAL_COMMUNITY): Payer: PPO | Admitting: Occupational Therapy

## 2015-11-05 ENCOUNTER — Inpatient Hospital Stay (HOSPITAL_COMMUNITY): Payer: PPO | Admitting: Physical Therapy

## 2015-11-05 MED ORDER — ESTRADIOL 1 MG PO TABS: 0.5000 mg | ORAL_TABLET | Freq: Every day | ORAL | Status: AC

## 2015-11-05 MED ORDER — METOPROLOL SUCCINATE ER 25 MG PO TB24: 25.0000 mg | ORAL_TABLET | Freq: Two times a day (BID) | ORAL | Status: DC

## 2015-11-05 MED ORDER — DICLOFENAC SODIUM 1 % TD GEL
2.0000 g | Freq: Three times a day (TID) | TRANSDERMAL | Status: DC
Start: 2015-11-05 — End: 2015-12-21

## 2015-11-05 MED ORDER — HYDROCHLOROTHIAZIDE 12.5 MG PO CAPS: 12.5000 mg | ORAL_CAPSULE | Freq: Every day | ORAL | Status: DC

## 2015-11-05 MED ORDER — LORAZEPAM 1 MG PO TABS: ORAL_TABLET | ORAL | Status: DC

## 2015-11-05 MED ORDER — OMEPRAZOLE 20 MG PO CPDR: 20.0000 mg | DELAYED_RELEASE_CAPSULE | Freq: Two times a day (BID) | ORAL | Status: AC

## 2015-11-05 MED ORDER — MAGNESIUM OXIDE 400 MG PO TABS: 400.0000 mg | ORAL_TABLET | Freq: Every day | ORAL | Status: AC

## 2015-11-05 MED ORDER — TIZANIDINE HCL 2 MG PO TABS: ORAL_TABLET | ORAL | Status: AC

## 2015-11-05 NOTE — Discharge Summary (Signed)
Discharge summary job [id number]

## 2015-11-05 NOTE — Progress Notes (Signed)
[facility] PHYSICAL MEDICINE & REHABILITATION     PROGRESS NOTE    Subjective/Complaints: Anxious again this morning but slept much better. Knee didn't act up as much yesterday. Still feels that she doesn't have her energy back  ROS: Pt denies fever, rash/itching, headache, blurred or double vision, nausea, vomiting, abdominal pain, diarrhea, chest pain, , palpitations, dysuria, dizziness, neck or back pain, bleeding,   or depression   Objective: Vital Signs: Blood pressure 145/33, pulse 53, temperature 98 F (36.7 C), temperature source Oral, resp. rate 17, height 5' (1.524 m), weight 53.8 kg (118 lb 9.7 oz), SpO2 99 %. No results found. No results for input(s): WBC, HGB, HCT, PLT in the last 72 hours. No results for input(s): NA, K, CL, GLUCOSE, BUN, CREATININE, CALCIUM in the last 72 hours.  Invalid input(s): CO CBG (last 3)  No results for input(s): GLUCAP in the last 72 hours.  Wt Readings from Last 3 Encounters:  11/05/15 53.8 kg (118 lb 9.7 oz)  10/22/15 58.7 kg (129 lb 6.6 oz)  10/22/15 57.153 kg (126 lb)    Physical Exam:  Constitutional: She is oriented to person, place, and time. She appears well-developed and well-nourished.  HENT:  Head: Normocephalic.  Right Ear: External ear normal.  Left Ear: External ear normal.  Eyes: Conjunctivae and EOM are normal.  Neck: Normal range of motion. Neck supple. No thyromegaly present.  Cardiovascular: Normal rate and regular rhythm.  Respiratory: Effort normal and breath sounds normal. No respiratory distress.  GI: Soft. Bowel sounds are normal. She exhibits no distension.  Musculoskeletal: She exhibits tenderness. She exhibits no edema. Reduced Left hip ROM- mainly flexion restricted, Valgus deformities bilateral knees--resists knee examination by activating quad. No gross effusion seen Neurological: She is alert and oriented to person, place, and time.  Alert and oriented 3. Follows commands.  Fair awareness  of deficits. remains tangential at times. DTRs 3+ LUE/LLE Decreased LT/pain left arm/leg. Motor: RUE 4+/5 proximal to distal RLE 4+/5 HF, KE and ADF/APF LUE: 4/5 delt, bicep,tricep, wrist, hand---mild PD, decreased FMC LLE hip flexion 4-/5  , ankle dorsi/plantar flexion 4/5  Skin: Skin is warm and dry.  Psychiatric: She has a normal mood and affect. Her behavior is normal. Judgment normal  Assessment/Plan: 1. 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 Physiatrist is providing close team supervision and 24 hour management of active medical problems listed below. Physiatrist and rehab team continue to assess barriers to discharge/monitor patient progress toward functional and medical goals.  Function:  Bathing Bathing position   Position: Shower  Bathing parts Body parts bathed by patient: Right arm, Left arm, Chest, Abdomen, Front perineal area, Buttocks, Right upper leg, Left upper leg, Right lower leg, Left lower leg Body parts bathed by helper: Back  Bathing assist Assist Level: Touching or steadying assistance(Pt > 75%)      Upper Body Dressing/Undressing Upper body dressing   What is the patient wearing?: Pull over shirt/dress, Bra Bra - Perfomed by patient: Thread/unthread right bra strap, Thread/unthread left bra strap, Hook/unhook bra (pull down sports bra)   Pull over shirt/dress - Perfomed by patient: Thread/unthread right sleeve, Thread/unthread left sleeve, Put head through opening, Pull shirt over trunk          Upper body assist Assist Level: More than reasonable time   Set up : To  obtain clothing/put away  Lower Body Dressing/Undressing Lower body dressing   What is the patient wearing?: Pants, Underwear, Socks, Shoes Underwear - Performed by patient:  Thread/unthread right underwear leg, Thread/unthread left underwear leg, Pull underwear up/down Underwear - Performed by helper: Thread/unthread right underwear leg, Thread/unthread left underwear leg, Pull underwear up/down Pants- Performed by patient: Thread/unthread right pants leg, Thread/unthread left pants leg, Pull pants up/down Pants- Performed by helper: Thread/unthread right pants leg, Thread/unthread left pants leg, Pull pants up/down Non-skid slipper socks- Performed by patient: Don/doff right sock, Don/doff left sock Non-skid slipper socks- Performed by helper: Don/doff right sock, Don/doff left sock Socks - Performed by patient: Don/doff right sock, Don/doff left sock   Shoes - Performed by patient: Don/doff right shoe, Don/doff left shoe Shoes - Performed by helper: Don/doff right shoe, Don/doff left shoe, Fasten right, Fasten left          Lower body assist Assist for lower body dressing: Supervision or verbal cues      Toileting Toileting   Toileting steps completed by patient: Adjust clothing prior to toileting, Performs perineal hygiene, Adjust clothing after toileting   Toileting Assistive Devices: Grab bar or rail  Toileting assist Assist level: Supervision or verbal cues   Transfers Chair/bed transfer   Chair/bed transfer method: Stand pivot, Ambulatory Chair/bed transfer assist level: Supervision or verbal cues Chair/bed transfer assistive device: Environmental consultant, Air cabin crew     Max distance: 15 Assist level: Supervision or verbal cues   Wheelchair   Type: Manual Max wheelchair distance: 150 Assist Level: No help, No cues, assistive device, takes more than reasonable amount of time  Cognition Comprehension Comprehension assist level: Understands complex 90% of the time/cues 10% of the time  Expression Expression assist level: Expresses complex 90% of the time/cues < 10% of the time  Social Interaction Social Interaction assist level:  Interacts appropriately with others with medication or extra time (anti-anxiety, antidepressant).  Problem Solving Problem solving assist level: Solves complex 90% of the time/cues < 10% of the time  Memory Memory assist level: Recognizes or recalls 75 - 89% of the time/requires cueing 10 - 24% of the time   Medical Problem List and Plan: 1. Left hemiparesis and balance deficits secondary to hypertensive right thalamic and internal capsule hemorrhages  -on track for Saturday discharge.  2. DVT Prophylaxis/Anticoagulation: SCDs. Encourage mobility 3. Pain Management: Tylenol as needed  -reduced zanaflex to '1mg'$   -continue voltaren gel for bilateral knees (OA)  - [employer] delivered a neoprene knee sleeve with lateral supports to better control knee in stance, pain relief  -?left-sided neuropathic pain---really don't want to add an anticonvulsant---will address the knee first 4. Mood/anxiety. Ativan 1 mg 3 times a day as needed (in place of non-formulary serax she uses at home)  Provide emotional support  -  scheduled ativan and zanaflex to help with sleep as that is what she uses at home. 5. Neuropsych: This patient is capable of making decisions on her own behalf. 6. Skin/Wound Care: Routine skin checks 7. Fluids/Electrolytes/Nutrition: intake reasonable.  8. Hypertension. Hydrochlorothiazide 12.5 mg daily, Toprol-XL 25 mg twice a day. Need to give metoprolol at HR of 50 or above 9. Diastolic congestive heart failure. Monitor for any signs of fluid overload. Weight decreased from admit. Clinically looks euvolemic 10. Multiple left hip surgeries.is a long term fall risk 11. GERD: protonix   LOS (Days) 9 A FACE TO FACE EVALUATION WAS PERFORMED a Mizuki Hoel T 11/05/2015 9:33 AM

## 2015-11-05 NOTE — Progress Notes (Addendum)
Physical Therapy Discharge Summary  Patient Details  Name: Theresa Harris MRN: [id number] Date of Birth: 01-19-1927  Today's Date: 11/05/2015 PT Individual Time: 1015-1125 PT Individual Time Calculation (min): 70 min    Patient has met 8 of 8 long term goals due to improved activity tolerance, improved balance, improved postural control, increased strength, decreased pain, ability to compensate for deficits, functional use of  right upper extremity, right lower extremity, left upper extremity and left lower extremity and improved coordination.  Patient to discharge at an ambulatory level Supervision.   Patient's care partner is independent per report to provide the necessary physical assistance at discharge.  Reasons goals not met: NA  Recommendation:  Patient will benefit from ongoing skilled PT services in home health setting to continue to advance safe functional mobility, address ongoing impairments in muscle weakness, standing balance, balance strategies, and activity tolerance and minimize fall risk.  Equipment: No equipment provided-patient has needed DME  Reasons for discharge: treatment goals met and discharge from hospital  Patient/family agrees with progress made and goals achieved: Yes  Skilled Therapeutic Intervention Session focused on functional mobility training in preparation for discharge home. Reviewed with patient how to don L knee brace and patient required assistance to correctly don brace due to UE weakness. Patient required supervision for gait using RW up to 200 ft using RW, simulated car transfer to sedan height, and dynamic standing balance,  Min A for stairs using L rail and ascending forwards/descending laterally with BUE support on rail, supervision for wheelchair propulsion in controlled environment, and mod I for transfers using RW and bed mobility on regular bed in ADL apartment. Reviewed recommendation for 24/7 supervision for patient safety and patient  verbalized understanding. Patient requested to return to bed at end of session with no further questions or concerns regarding discharge home.   PT Discharge Precautions/Restrictions Restrictions Weight Bearing Restrictions: No Pain Pain Assessment Pain Assessment: No/denies pain Vision/Perception   No changes from baseline  Cognition Overall Cognitive Status: Within Functional Limits for tasks assessed Arousal/Alertness: Awake/alert Orientation Level: Oriented X4 Attention: Selective Selective Attention: Appears intact Memory: Impaired Memory Impairment: Decreased recall of new information Awareness: Appears intact Problem Solving: Appears intact Safety/Judgment: Appears intact Sensation Sensation Light Touch: Impaired Detail Light Touch Impaired Details: Impaired LUE Hot/Cold: Appears Intact Proprioception: Appears Intact Coordination Gross Motor Movements are Fluid and Coordinated: No Fine Motor Movements are Fluid and Coordinated: No Heel Shin Test: Impaired LLE Motor  Motor Motor: Hemiplegia Motor - Discharge Observations: Limited by LLE s/p multiple surgeries  Mobility Bed Mobility Bed Mobility: Rolling Right;Rolling Left;Sit to Supine;Supine to Sit Rolling Right: 6: Modified independent (Device/Increase time) Rolling Left: 6: Modified independent (Device/Increase time) Supine to Sit: 6: Modified independent (Device/Increase time) Sit to Supine: 6: Modified independent (Device/Increase time) Transfers Transfers: Yes Sit to Stand: 6: Modified independent (Device/Increase time) Stand to Sit: 6: Modified independent (Device/Increase time) Locomotion  Ambulation Ambulation: Yes Ambulation/Gait Assistance: 5: Supervision Ambulation Distance (Feet): 200 Feet Assistive device: Rolling walker Gait Gait: Yes Gait Pattern: Impaired Gait Pattern: Step-through pattern;Trendelenburg;Shuffle;Right flexed knee in stance;Poor foot clearance - left;Narrow base of  support;Trunk flexed Gait velocity: 10 MWT = 0.144 m/s Stairs / Additional Locomotion Stairs: Yes Stairs Assistance: 4: Min assist Stair Management Technique: One rail Left;Step to pattern;Forwards;Sideways Number of Stairs: 12 Height of Stairs: 3 (8 3", 4 6") Ramp: 5: Supervision Curb: 5: Psychiatric nurse: Yes Wheelchair Assistance: 5: Careers information officer: Both upper extremities Wheelchair Parts Management:  Needs assistance Distance: 100  Trunk/Postural Assessment  Cervical Assessment Cervical Assessment: Exceptions to WFL (forward head) Thoracic Assessment Thoracic Assessment: Exceptions to WFL (kyphotic) Lumbar Assessment Lumbar Assessment: Within Functional Limits Postural Control Postural Control: Within Functional Limits  Balance Balance Balance Assessed: Yes Static Standing Balance Static Standing - Balance Support: Bilateral upper extremity supported;During functional activity Static Standing - Level of Assistance: 6: Modified independent (Device/Increase time) Dynamic Standing Balance Dynamic Standing - Balance Support: Bilateral upper extremity supported;During functional activity Dynamic Standing - Level of Assistance: 5: Stand by assistance Extremity Assessment  RUE Assessment RUE Assessment: Within Functional Limits LUE Assessment LUE Assessment: Exceptions to WFL LUE Strength LUE Overall Strength: Deficits RLE Assessment RLE Assessment: Within Functional Limits LLE Assessment LLE Assessment: Exceptions to WFL LLE Strength LLE Overall Strength Comments: grossly 4/5 except hip flexion 3//5   See Function Navigator for Current Functional Status.  Varner, Rebecca A 11/05/2015, 12:29 PM   

## 2015-11-05 NOTE — Progress Notes (Signed)
Occupational Therapy Session Note  Patient Details  Name: Theresa Harris MRN: [id number] Date of Birth: February 05, 1927  Today's Date: 11/05/2015 OT Individual Time:  - 0830-1000 (90 min)      Short Term Goals: Week 1:  OT Short Term Goal 1 (Week 1): STG= LTG :     Skilled Therapeutic Interventions/Progress Updates:    Focus of treatment was bed mobility, transfers,  Neuro-muscular reeducation, sitting balance, standing balance, therapeutic activities, sustained attention, postural control, and mobility.   Pt. Went from supine to sit > RW>toilet>shower seat with min cues and SBA.  Pt used toilet and was continent of urine.  She needed min cues for weight shifting to forefoot for balance and safety during shower and standing.  Pt completed bathing with sit to stand using grab bars.  Pt did dressing at sink with increased time and supervision for set up.         Therapy Documentation Precautions:  Precautions Precautions: Fall Restrictions Weight Bearing Restrictions: No    Vital Signs: Therapy Vitals Temp: 98 F (36.7 C) Temp Source: Oral Pulse Rate: (!) 53 Resp: 17 BP: (!) 145/33 mmHg Patient Position (if appropriate): Sitting Oxygen Therapy SpO2: 99 % O2 Device: Not Delivered Pain:  5/10 back pain             See Function Navigator for Current Functional Status.   Therapy/Group: Individual Therapy  Lisa Roca 11/05/2015, 7:51 AM

## 2015-11-05 NOTE — Progress Notes (Signed)
Occupational Therapy Discharge Summary  Patient Details  Name: Theresa Harris MRN: [id number] Date of Birth: 17-Mar-1927  Today's Date: 11/05/2015 OT Individual Time: 1300-1400  1300-1400  (60 min) OT Individual Time Calculation (min): 60 min    Patient has met 9 of 11 long term goals due to improved activity tolerance, improved balance, ability to compensate for deficits and functional use of  LEFT upper extremity.  Patient to discharge at overall Supervision level.  Patient's care partner unavailable to provide the necessary physical assistance at discharge.    Reasons goals not met:  Pt needs to continue working on LUE in functional tasks; she tends to drops objects and compensates with the use of her RUE.  Pt  will have assistance for simple meal prep and this goal was not met Recommendation:  Patient will benefit from ongoing skilled OT services in home health setting to continue to advance functional skills in the area of BADL. And LUE strength and coordination  Equipment: No equipment provided  Reasons for discharge: refusal of 3 consecutive treatment sessions without medical reason and discharge from hospital  Patient/family agrees with progress made and goals achieved: Yes  OT Discharge Precautions/Restrictions  Precautions Precautions: Fall Restrictions Weight Bearing Restrictions: No .Skilled OT intervention:   Addressed LUE strength, coordination  And NMRE.  OT educated pt on exercises for decreased shoulder pain on right side for previous injuries.  Addrssed LUE with exercises for hand using theraputty, and general AROM.  Pt verbalized and demonstrated understanding>  Explained follow up in home with [facility] OT for balance, LUE NMRE and IADL.    Marland Kitchen  Vital Signs Therapy Vitals Temp: 47 F (36.7 C) Temp Source: Oral Pulse Rate: (!) 53 Resp: 17 BP: (!) 145/33 mmHg Patient Position (if appropriate): Sitting Oxygen Therapy SpO2: 99 % O2 Device: Not Delivered Pain   none       Cognition Overall Cognitive Status: Within Functional Limits for tasks assessed Arousal/Alertness: Awake/alert Orientation Level: Oriented X4 Attention: Selective Selective Attention: Appears intact Memory: Impaired Memory Impairment: Decreased recall of new information Awareness: Appears intact Problem Solving: Appears intact Safety/Judgment: Appears intact Sensation Sensation Light Touch: Impaired Detail Light Touch Impaired Details: Impaired LUE Hot/Cold: Appears Intact Proprioception: Appears Intact Coordination Gross Motor Movements are Fluid and Coordinated: No Fine Motor Movements are Fluid and Coordinated: No Coordination and Movement Description:  (decreased pincher and fine motor control) Motor  Motor Motor: Hemiplegia Motor - Skilled Clinical Observations: impaired balance Motor - Discharge Observations: Limited by LLE s/p multiple surgeries Mobility  Bed Mobility Bed Mobility: Rolling Right;Rolling Left;Sit to Supine;Supine to Sit Rolling Right: 6: Modified independent (Device/Increase time) Rolling Left: 6: Modified independent (Device/Increase time) Supine to Sit: 6: Modified independent (Device/Increase time) Sit to Supine: 6: Modified independent (Device/Increase time) Sit to Supine - Details: Verbal cues for technique;Verbal cues for sequencing Transfers Sit to Stand: 6: Modified independent (Device/Increase time) Stand to Sit: 6: Modified independent (Device/Increase time)  Trunk/Postural Assessment  Cervical Assessment Cervical Assessment: Exceptions to [facility] Thoracic Assessment Thoracic Assessment: Exceptions to [facility] Lumbar Assessment Lumbar Assessment: Within Functional Limits Postural Control Postural Control: Within Functional Limits  Balance Balance Balance Assessed: Yes Static Sitting Balance Static Sitting - Balance Support: No upper extremity supported Static Sitting - Level of Assistance: 6: Modified independent  (Device/Increase time) Dynamic Sitting Balance Dynamic Sitting - Balance Support: Left upper extremity supported;Right upper extremity supported;Feet supported Dynamic Sitting - Level of Assistance: 5: Stand by assistance;6: Modified independent (Device/Increase time) Static  Standing Balance Static Standing - Balance Support: Bilateral upper extremity supported;During functional activity Static Standing - Level of Assistance: 6: Modified independent (Device/Increase time) Dynamic Standing Balance Dynamic Standing - Balance Support: Bilateral upper extremity supported;During functional activity Dynamic Standing - Level of Assistance: 5: Stand by assistance Dynamic Standing - Balance Activities: Lateral lean/weight shifting;Forward lean/weight shifting;Reaching for objects;Reaching across midline Extremity/Trunk Assessment RUE Assessment RUE Assessment: Within Functional Limits LUE Assessment LUE Assessment: Exceptions to [facility] LUE Strength LUE Overall Strength: Deficits LUE Overall Strength Comments:  (decreased grip and fine motor control)   See Function Navigator for Current Functional Status.  Lisa Roca 11/05/2015, 5:54 PM

## 2015-11-05 NOTE — Discharge Summary (Signed)
NAMEAISHAH, TEFFETELLER NO.:  [id number]  MEDICAL RECORD NO.:  [id number]  LOCATION:  [id number]                        FACILITY:  [facility]  PHYSICIAN:  Meredith Staggers, M.D.DATE OF BIRTH:  [birth year]  DATE OF ADMISSION:  10/27/2015 DATE OF DISCHARGE:  11/06/2015                              DISCHARGE SUMMARY   DISCHARGE DIAGNOSES: 1. 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 2. SCDs for DVT prophylaxis. 3. Pain management. 4. Anxiety. 5. Hypertension. 6. Diastolic congestive heart failure. 7. History of multiple left hip surgeries. 8. Gastroesophageal reflux disease.  HISTORY OF PRESENT ILLNESS:  This is an 81 year old right-handed female with hypertension, diastolic congestive heart failure, multiple hip surgeries who lives alone in [place], [place].  Independent with a crutch and walker prior to admission.  Presented to [facility] on October 22, 2015, after a fall from a step stool landing on her left side.  Blood pressure 188/45.  CT of the head showed a small parenchymal hemorrhage involving the right thalamus and internal capsule without hydrocephalus.  Incidental findings of a 15 mm right frontal meningioma.  She was transferred to [facility] for further evaluation.  The patient did not receive tPA.  Echocardiogram with ejection fraction of 65%.  No wall motion abnormalities.  Carotid Dopplers with right 40-59% ICA stenosis.  Neurology consulted.  Close monitoring of blood pressure.  Conservative care.  Diet was advanced to regular.  The patient was admitted for a comprehensive rehab program.  PAST MEDICAL HISTORY:  See discharge diagnoses.  SOCIAL HISTORY:  Lives alone, independent with crutch and walker prior to admission.  FUNCTIONAL STATUS UPON ADMISSION TO REHAB SERVICES:  Minimal assist +2 physical assist 15 feet rolling walker, minimal assist supine to  sit, min to mod assist activities of daily living.  PHYSICAL EXAMINATION:  VITAL SIGNS:  Blood pressure 125/42, pulse 74, temperature 99, respirations 18. GENERAL:  This was an alert female, somewhat anxious and oriented x3. LUNGS:  Clear to auscultation without wheeze. CARDIAC:  Regular rate and rhythm without murmur. ABDOMEN:  Soft, nontender.  Good bowel sounds.  She exhibited a fair awareness of her deficits.  REHABILITATION HOSPITAL COURSE:  The patient was admitted to Inpatient Rehab Services with therapies initiated on a 3-hour daily basis consisting of physical therapy, occupational therapy, and rehabilitation nursing.  The following issues were addressed during the patient's rehabilitation stay.  Pertaining to Ms. Trivett left hemiparesis, balance deficits secondary to suspect hypertensive right thalamic internal capsule hemorrhage after a fall remained stable.  She would follow up Neurology Services.  SCDs for DVT prophylaxis.  Pain management with the use of Zanaflex reduced to 1 mg at bedtime.  She was using Voltaren gel for her needs.  She was provided with a neoprene knee sleeve with lateral supports to better control her knee.  She was using Ativan as needed for anxiety.  Blood pressures well controlled on hydrochlorothiazide and Toprol.  She would follow up with her primary MD.  She exhibited no signs of fluid overload.  The patient received weekly collaborative interdisciplinary team conferences to discuss estimated length of stay, family  teaching, any barriers to discharge. The patient transitioned to sitting edge of bed, modified independent sit to stand, rolling walker supervision, ambulating to the bathroom. Performed toilet transfer tasks with overall supervision.  Transition to standing again with supervision, brace as advised.  She could gather her belongings for activities of daily living and homemaking.  Perform clothing management and hygiene supervision.   Ambulating using a walker supervision to the toilet.  Full teaching was completed.  Plan discharged to home with ongoing therapies dictated per [facility].  DISCHARGE MEDICATIONS: 1. Os-Cal 1 tablet b.i.d. 2. Voltaren gel three times daily to affected area. 3. Estrace 0.5 mg p.o. daily. 4. Hydrochlorothiazide 12.5 mg p.o. daily. 5. Ativan 1 mg at bedtime and 1 mg twice daily as needed. 6. Magnesium oxide 400 mg p.o. daily. 7. Toprol-XL 25 mg p.o. b.i.d. 8. Protonix 40 mg p.o. b.i.d. 9. Zanaflex 1 mg at bedtime as well as every 8 hours as needed.  DIET:  Regular.  FOLLOWUP:  She would follow up with Dr. Alger Simons at the outpatient Rehab Service office as directed; Dr. Antony Contras, 1 month call for appointment; Dr. Frazier Richards, Medical Management.     Lauraine Rinne, P.A.   ______________________________ Meredith Staggers, M.D.    DA/MEDQ  D:  11/05/2015  T:  11/05/2015  Job:  [id number]  cc:   Frazier Richards, MD Pramod P. Leonie Man, MD

## 2015-11-05 NOTE — Discharge Instructions (Signed)
Inpatient Rehab Discharge Instructions  Theresa Harris Discharge date and time: No discharge date for patient encounter.   Activities/Precautions/ Functional Status: Activity: activity as tolerated Diet: regular diet Wound Care: none needed Functional status:  ___ No restrictions     ___ Walk up steps independently ___ 24/7 supervision/assistance   ___ Walk up steps with assistance ___ Intermittent supervision/assistance  ___ Bathe/dress independently ___ Walk with walker     ___ Bathe/dress with assistance ___ Walk Independently    ___ Shower independently _x__ Walk with assistance    ___ Shower with assistance ___ No alcohol     ___ Return to work/school ________   COMMUNITY REFERRALS UPON DISCHARGE:    Home Health:   PT     OT     ST                     Agency:  [facility] Phone: [phone]         Special Instructions:  No driving  My questions have been answered and I understand these instructions. I will adhere to these goals and the provided educational materials after my discharge from the hospital.  Patient/Caregiver Signature _______________________________ Date __________  Clinician Signature _______________________________________ Date __________  Please bring this form and your medication list with you to all your follow-up doctor's appointments.

## 2015-11-05 NOTE — Progress Notes (Signed)
Social Work  Discharge Note  The overall goal for the admission was met for:   Discharge location: Yes - home with family and private duty care providing 24/7 supervision  Length of Stay: Yes - 10 days (with 12/10 d/c)  Discharge activity level: Yes - supervision  Home/community participation: Yes  Services provided included: MD, RD, PT, OT, SLP, RN, TR, Pharmacy, Neuropsych and SW  Financial Services: Private Insurance: [employer]  Follow-up services arranged: Home Health: PT, OT, ST via [place] and Patient/Family has no preference for HH/DME agencies  Comments (or additional information):  Patient/Family verbalized understanding of follow-up arrangements: Yes  Individual responsible for coordination of the follow-up plan: pt/ daughters  Confirmed correct DME delivered: NA    Theresa Harris

## 2015-11-06 NOTE — Progress Notes (Signed)
Patient is discharged from room [phone] at this time. Alert and in stable condition. No IV site noted. Instructions read to patient and understanding verbalized. Left unit via wheelchair with all belongings at side.

## 2015-11-06 NOTE — Progress Notes (Signed)
[facility] PHYSICAL MEDICINE & REHABILITATION     PROGRESS NOTE    Subjective/Complaints: Low energy but no other c/os  ROS: Pt denies CP or SOB   Objective: Vital Signs: Blood pressure 142/45, pulse 62, temperature 98.6 F (37 C), temperature source Oral, resp. rate 16, height 5' (1.524 m), weight 53.2 kg (117 lb 4.6 oz), SpO2 99 %. No results found. No results for input(s): WBC, HGB, HCT, PLT in the last 72 hours. No results for input(s): NA, K, CL, GLUCOSE, BUN, CREATININE, CALCIUM in the last 72 hours.  Invalid input(s): CO CBG (last 3)  No results for input(s): GLUCAP in the last 72 hours.  Wt Readings from Last 3 Encounters:  11/06/15 53.2 kg (117 lb 4.6 oz)  10/22/15 58.7 kg (129 lb 6.6 oz)  10/22/15 57.153 kg (126 lb)    Physical Exam:  Constitutional: She is oriented to person, place, and time. She appears well-developed and well-nourished.  HENT:  Head: Normocephalic.  Right Ear: External ear normal.  Left Ear: External ear normal.  Eyes: Conjunctivae and EOM are normal.  Neck: Normal range of motion. Neck supple. No thyromegaly present.  Cardiovascular: Normal rate and regular rhythm.  Respiratory: Effort normal and breath sounds normal. No respiratory distress.  GI: Soft. Bowel sounds are normal. She exhibits no distension.  Musculoskeletal: She exhibits tenderness. She exhibits no edema. Reduced Left hip ROM- mainly flexion restricted, Valgus deformities bilateral knees--resists knee examination by activating quad. No gross effusion seen Neurological: She is alert and oriented to person, place, and time.  Alert and oriented 3. Follows commands.  Fair awareness of deficits. remains tangential at times. DTRs 3+ LUE/LLE Decreased LT/pain left arm/leg. Motor: RUE 4+/5 proximal to distal RLE 4+/5 HF, KE and ADF/APF LUE: 4/5 delt, bicep,tricep, wrist, hand---mild PD, decreased FMC LLE hip flexion 4-/5  , ankle dorsi/plantar flexion 4/5  Skin:  Skin is warm and dry.  Psychiatric: She has a normal mood and affect. Her behavior is normal. Judgment normal  Assessment/Plan: 1. Balance, gait deficits,left sided weakness secondary to spontaneous right thalamic and internal capsule hemorrhages (likely hypertensive in etiology) Stable for D/C today F/u PCP in 1-2 weeks F/u PM&R 3 weeks See D/C summary See D/C instructions Function:  Bathing Bathing position   Position: Shower  Bathing parts Body parts bathed by patient: Right arm, Left arm, Chest, Abdomen, Front perineal area, Buttocks, Right upper leg, Left upper leg, Right lower leg, Left lower leg, Back Body parts bathed by helper: Back  Bathing assist Assist Level: Supervision or verbal cues      Upper Body Dressing/Undressing Upper body dressing   What is the patient wearing?: Pull over shirt/dress, Bra Bra - Perfomed by patient: Thread/unthread right bra strap, Thread/unthread left bra strap, Hook/unhook bra (pull down sports bra)   Pull over shirt/dress - Perfomed by patient: Thread/unthread right sleeve, Thread/unthread left sleeve, Put head through opening, Pull shirt over trunk          Upper body assist Assist Level: More than reasonable time   Set up : To obtain clothing/put away  Lower Body Dressing/Undressing Lower body dressing   What is the patient wearing?: Pants, Underwear, Socks, Shoes Underwear - Performed by patient: Thread/unthread right underwear leg, Thread/unthread left underwear leg, Pull underwear up/down Underwear - Performed by helper: Thread/unthread right underwear leg, Thread/unthread left underwear leg, Pull underwear up/down Pants- Performed by patient: Thread/unthread right pants leg, Thread/unthread left pants leg, Pull pants up/down Pants- Performed by helper: Thread/unthread  right pants leg, Thread/unthread left pants leg, Pull pants up/down Non-skid slipper socks- Performed by patient: Don/doff right sock, Don/doff left sock Non-skid  slipper socks- Performed by helper: Don/doff right sock, Don/doff left sock Socks - Performed by patient: Don/doff right sock, Don/doff left sock   Shoes - Performed by patient: Don/doff right shoe, Don/doff left shoe Shoes - Performed by helper: Don/doff right shoe, Don/doff left shoe, Fasten right, Fasten left          Lower body assist Assist for lower body dressing: Supervision or verbal cues      Toileting Toileting   Toileting steps completed by patient: Adjust clothing prior to toileting, Performs perineal hygiene, Adjust clothing after toileting   Toileting Assistive Devices: Grab bar or rail  Toileting assist Assist level: More than reasonable time, Set up/obtain supplies   Transfers Chair/bed transfer   Chair/bed transfer method: Stand pivot, Ambulatory Chair/bed transfer assist level: Supervision or verbal cues Chair/bed transfer assistive device: Environmental consultant, Air cabin crew     Max distance: 200 Assist level: Supervision or verbal cues   Wheelchair   Type:  (has transport chair and wc) Max wheelchair distance: 100 Assist Level: Supervision or verbal cues  Cognition Comprehension Comprehension assist level: Understands complex 90% of the time/cues 10% of the time  Expression Expression assist level: Expresses complex 90% of the time/cues < 10% of the time  Social Interaction Social Interaction assist level: Interacts appropriately with others with medication or extra time (anti-anxiety, antidepressant).  Problem Solving Problem solving assist level: Solves complex 90% of the time/cues < 10% of the time  Memory Memory assist level: Recognizes or recalls 75 - 89% of the time/requires cueing 10 - 24% of the time   Medical Problem List and Plan: 1. Left hemiparesis and balance deficits secondary to hypertensive right thalamic and internal capsule hemorrhages  -on track for Saturday discharge.  2. DVT Prophylaxis/Anticoagulation: SCDs. Encourage  mobility 3. Pain Management: Tylenol as needed  -reduced zanaflex to '1mg'$   -continue voltaren gel for bilateral knees (OA)  - [employer] delivered a neoprene knee sleeve with lateral supports to better control knee in stance, pain relief  -?left-sided neuropathic pain---really don't want to add an anticonvulsant---will address the knee first 4. Mood/anxiety. Ativan 1 mg 3 times a day as needed (in place of non-formulary serax she uses at home)  Provide emotional support  -  scheduled ativan and zanaflex to help with sleep as that is what she uses at home. 5. Neuropsych: This patient is capable of making decisions on her own behalf. 6. Skin/Wound Care: Routine skin checks 7. Fluids/Electrolytes/Nutrition: intake reasonable.  8. Hypertension. Hydrochlorothiazide 12.5 mg daily, Toprol-XL 25 mg twice a day. Need to give metoprolol at HR of 50 or above 9. Diastolic congestive heart failure. Monitor for any signs of fluid overload. Weight decreased from admit. Clinically looks euvolemic 10. Multiple left hip surgeries.is a long term fall risk 11. GERD: protonix   LOS (Days) 10 A FACE TO FACE EVALUATION WAS PERFORMED a Ailana Cuadrado E 11/06/2015 10:05 AM

## 2015-11-30 DIAGNOSIS — R270 Ataxia, unspecified: Secondary | ICD-10-CM | POA: Diagnosis not present

## 2015-11-30 DIAGNOSIS — K219 Gastro-esophageal reflux disease without esophagitis: Secondary | ICD-10-CM | POA: Diagnosis not present

## 2015-11-30 DIAGNOSIS — F419 Anxiety disorder, unspecified: Secondary | ICD-10-CM | POA: Diagnosis not present

## 2015-11-30 DIAGNOSIS — S062X0D Diffuse traumatic brain injury without loss of consciousness, subsequent encounter: Secondary | ICD-10-CM | POA: Diagnosis not present

## 2015-11-30 DIAGNOSIS — M4800 Spinal stenosis, site unspecified: Secondary | ICD-10-CM | POA: Diagnosis not present

## 2015-11-30 DIAGNOSIS — G8929 Other chronic pain: Secondary | ICD-10-CM | POA: Diagnosis not present

## 2015-11-30 DIAGNOSIS — I110 Hypertensive heart disease with heart failure: Secondary | ICD-10-CM | POA: Diagnosis not present

## 2015-11-30 DIAGNOSIS — W108XXD Fall (on) (from) other stairs and steps, subsequent encounter: Secondary | ICD-10-CM | POA: Diagnosis not present

## 2015-11-30 DIAGNOSIS — I5030 Unspecified diastolic (congestive) heart failure: Secondary | ICD-10-CM | POA: Diagnosis not present

## 2015-12-01 ENCOUNTER — Telehealth: Payer: Self-pay

## 2015-12-01 NOTE — Telephone Encounter (Signed)
Theresa Harris with [facility]-is requesting verbal orders to extend therapy for 3 weeks, once a week. Orders have been verbally approved.

## 2015-12-03 DIAGNOSIS — M4800 Spinal stenosis, site unspecified: Secondary | ICD-10-CM | POA: Diagnosis not present

## 2015-12-03 DIAGNOSIS — G8929 Other chronic pain: Secondary | ICD-10-CM | POA: Diagnosis not present

## 2015-12-03 DIAGNOSIS — F419 Anxiety disorder, unspecified: Secondary | ICD-10-CM | POA: Diagnosis not present

## 2015-12-03 DIAGNOSIS — I5030 Unspecified diastolic (congestive) heart failure: Secondary | ICD-10-CM | POA: Diagnosis not present

## 2015-12-03 DIAGNOSIS — W108XXD Fall (on) (from) other stairs and steps, subsequent encounter: Secondary | ICD-10-CM | POA: Diagnosis not present

## 2015-12-03 DIAGNOSIS — I110 Hypertensive heart disease with heart failure: Secondary | ICD-10-CM | POA: Diagnosis not present

## 2015-12-03 DIAGNOSIS — K219 Gastro-esophageal reflux disease without esophagitis: Secondary | ICD-10-CM | POA: Diagnosis not present

## 2015-12-03 DIAGNOSIS — R270 Ataxia, unspecified: Secondary | ICD-10-CM | POA: Diagnosis not present

## 2015-12-03 DIAGNOSIS — S062X0D Diffuse traumatic brain injury without loss of consciousness, subsequent encounter: Secondary | ICD-10-CM | POA: Diagnosis not present

## 2015-12-07 DIAGNOSIS — M4800 Spinal stenosis, site unspecified: Secondary | ICD-10-CM | POA: Diagnosis not present

## 2015-12-07 DIAGNOSIS — K219 Gastro-esophageal reflux disease without esophagitis: Secondary | ICD-10-CM | POA: Diagnosis not present

## 2015-12-07 DIAGNOSIS — I110 Hypertensive heart disease with heart failure: Secondary | ICD-10-CM | POA: Diagnosis not present

## 2015-12-07 DIAGNOSIS — R270 Ataxia, unspecified: Secondary | ICD-10-CM | POA: Diagnosis not present

## 2015-12-07 DIAGNOSIS — F419 Anxiety disorder, unspecified: Secondary | ICD-10-CM | POA: Diagnosis not present

## 2015-12-07 DIAGNOSIS — G8929 Other chronic pain: Secondary | ICD-10-CM | POA: Diagnosis not present

## 2015-12-07 DIAGNOSIS — W108XXD Fall (on) (from) other stairs and steps, subsequent encounter: Secondary | ICD-10-CM | POA: Diagnosis not present

## 2015-12-07 DIAGNOSIS — S062X0D Diffuse traumatic brain injury without loss of consciousness, subsequent encounter: Secondary | ICD-10-CM | POA: Diagnosis not present

## 2015-12-07 DIAGNOSIS — I5030 Unspecified diastolic (congestive) heart failure: Secondary | ICD-10-CM | POA: Diagnosis not present

## 2015-12-15 DIAGNOSIS — I110 Hypertensive heart disease with heart failure: Secondary | ICD-10-CM | POA: Diagnosis not present

## 2015-12-15 DIAGNOSIS — F419 Anxiety disorder, unspecified: Secondary | ICD-10-CM | POA: Diagnosis not present

## 2015-12-15 DIAGNOSIS — W108XXD Fall (on) (from) other stairs and steps, subsequent encounter: Secondary | ICD-10-CM | POA: Diagnosis not present

## 2015-12-15 DIAGNOSIS — I5030 Unspecified diastolic (congestive) heart failure: Secondary | ICD-10-CM | POA: Diagnosis not present

## 2015-12-15 DIAGNOSIS — M4800 Spinal stenosis, site unspecified: Secondary | ICD-10-CM | POA: Diagnosis not present

## 2015-12-15 DIAGNOSIS — S062X0D Diffuse traumatic brain injury without loss of consciousness, subsequent encounter: Secondary | ICD-10-CM | POA: Diagnosis not present

## 2015-12-15 DIAGNOSIS — R270 Ataxia, unspecified: Secondary | ICD-10-CM | POA: Diagnosis not present

## 2015-12-15 DIAGNOSIS — G8929 Other chronic pain: Secondary | ICD-10-CM | POA: Diagnosis not present

## 2015-12-15 DIAGNOSIS — K219 Gastro-esophageal reflux disease without esophagitis: Secondary | ICD-10-CM | POA: Diagnosis not present

## 2015-12-20 ENCOUNTER — Ambulatory Visit: Payer: PPO | Admitting: Physical Medicine & Rehabilitation

## 2015-12-21 ENCOUNTER — Encounter: Payer: PPO | Attending: Physical Medicine & Rehabilitation | Admitting: Physical Medicine & Rehabilitation

## 2015-12-21 ENCOUNTER — Encounter: Payer: Self-pay | Admitting: Physical Medicine & Rehabilitation

## 2015-12-21 VITALS — BP 140/58 | HR 50

## 2015-12-21 DIAGNOSIS — R269 Unspecified abnormalities of gait and mobility: Secondary | ICD-10-CM | POA: Diagnosis not present

## 2015-12-21 DIAGNOSIS — I10 Essential (primary) hypertension: Secondary | ICD-10-CM | POA: Insufficient documentation

## 2015-12-21 DIAGNOSIS — I5030 Unspecified diastolic (congestive) heart failure: Secondary | ICD-10-CM | POA: Insufficient documentation

## 2015-12-21 DIAGNOSIS — M19011 Primary osteoarthritis, right shoulder: Secondary | ICD-10-CM | POA: Insufficient documentation

## 2015-12-21 DIAGNOSIS — F419 Anxiety disorder, unspecified: Secondary | ICD-10-CM | POA: Diagnosis not present

## 2015-12-21 DIAGNOSIS — R410 Disorientation, unspecified: Secondary | ICD-10-CM | POA: Insufficient documentation

## 2015-12-21 DIAGNOSIS — M170 Bilateral primary osteoarthritis of knee: Secondary | ICD-10-CM | POA: Diagnosis not present

## 2015-12-21 DIAGNOSIS — I610 Nontraumatic intracerebral hemorrhage in hemisphere, subcortical: Secondary | ICD-10-CM | POA: Diagnosis not present

## 2015-12-21 DIAGNOSIS — R079 Chest pain, unspecified: Secondary | ICD-10-CM | POA: Diagnosis not present

## 2015-12-21 DIAGNOSIS — Z87891 Personal history of nicotine dependence: Secondary | ICD-10-CM | POA: Insufficient documentation

## 2015-12-21 DIAGNOSIS — M1712 Unilateral primary osteoarthritis, left knee: Secondary | ICD-10-CM | POA: Diagnosis not present

## 2015-12-21 DIAGNOSIS — R202 Paresthesia of skin: Secondary | ICD-10-CM | POA: Insufficient documentation

## 2015-12-21 DIAGNOSIS — M5440 Lumbago with sciatica, unspecified side: Secondary | ICD-10-CM | POA: Diagnosis not present

## 2015-12-21 DIAGNOSIS — G8194 Hemiplegia, unspecified affecting left nondominant side: Secondary | ICD-10-CM | POA: Diagnosis not present

## 2015-12-21 DIAGNOSIS — Z9181 History of falling: Secondary | ICD-10-CM | POA: Diagnosis not present

## 2015-12-21 NOTE — Patient Instructions (Signed)
  PLEASE CALL ME WITH ANY PROBLEMS OR QUESTIONS (#[phone]).      

## 2015-12-21 NOTE — Progress Notes (Signed)
Subjective:    Patient ID: Theresa Harris, female    DOB: [birth year], 82 y.o.   MRN: [id number]  HPI   Theresa Harris is here in follow up of her [place]. She has been home with home health and an aide. She is having ongoing "sciatica" and low back pain which was present prior to her hospital admission. Home health therapy is about to finish up. They have addressing balance, strength, and gait. No falls at home although she has lost her balance twice. She is using her RW for basic mobility around the house.   From a CV standpoint she has shown improvement. She had her medications recently adjusted including metoprolol which was increased which relieved any chest pain she was experiencing.   She would like to return to driving. She wants to drive local distance.       Pain Inventory Average Pain 0 Pain Right Now 0 My pain is no pain  In the last 24 hours, has pain interfered with the following? General activity 4 Relation with others 0 Enjoyment of life 7 What TIME of day is your pain at its worst? varies Sleep (in general) Fair  Pain is worse with: walking and standing Pain improves with: medication Relief from Meds: no med  Mobility walk with assistance use a walker ability to climb steps?  no do you drive?  no  Function retired  Neuro/Psych weakness tingling trouble walking confusion anxiety  Prior Studies Any changes since last visit?  no  Physicians involved in your care Any changes since last visit?  no   History reviewed. No pertinent family history. Social History   Social History  . Marital Status: Widowed    Spouse Name: N/A  . Number of Children: N/A  . Years of Education: N/A   Social History Main Topics  . Smoking status: Former Research scientist (life sciences)  . Smokeless tobacco: None  . Alcohol Use: No  . Drug Use: No  . Sexual Activity: Not Asked   Other Topics Concern  . None   Social History Narrative   Past Surgical History  Procedure Laterality Date   . Knee surgery      right  . Abdominal hysterectomy    . Hip surgery    . Cholecystectomy    . Carpal tunnel release     Past Medical History  Diagnosis Date  . Spinal stenosis   . CHF (congestive heart failure) ([place])   . Anxiety   . HTN (hypertension)    BP 140/58 mmHg  Pulse 50  SpO2 98%  Opioid Risk Score:   Fall Risk Score:  `1  Depression screen PHQ 2/9  Depression screen PHQ 2/9 12/21/2015  Decreased Interest 1  Down, Depressed, Hopeless 1  PHQ - 2 Score 2  Altered sleeping 1  Tired, decreased energy 2  Change in appetite 0  Feeling bad or failure about yourself  0  Trouble concentrating 1  Moving slowly or fidgety/restless 0  Suicidal thoughts 0  PHQ-9 Score 6  Difficult doing work/chores Not difficult at all     Review of Systems  Respiratory: Positive for shortness of breath.   All other systems reviewed and are negative.      Objective:   Physical Exam  HENT:  Head: Normocephalic.  Right Ear: External ear normal.  Left Ear: External ear normal.  Eyes: Conjunctivae and EOM are normal.  Neck: Normal range of motion. Neck supple. No thyromegaly present.  Cardiovascular: Normal rate and regular  rhythm.  Respiratory: Effort normal and breath sounds normal. No respiratory distress.  GI: Soft. Bowel sounds are normal. She exhibits no distension.  Musculoskeletal: She exhibits tenderness. She exhibits no edema. Reduced Left hip ROM- mainly flexion restricted, Valgus deformities bilateral knees--left knee with signficant antalgia with wb. uses arms to off load. Right shoulder with 45 degrees flexion and ABD. Tender with minimal IR/ER.  Neurological: She is alert and oriented to person, place, and time.  Alert and oriented 3. Follows commands.  Fair awareness of deficits. remains tangential at times. DTRs 3+ LUE/LLE Decreased LT/pain left arm/leg. Motor: RUE 4+/5 proximal to distal---limited at right shoulder RLE 4+/5 HF, KE and  ADF/APF LUE: 4/5 delt, bicep,tricep, wrist, hand---mild PD, decreased FMC LLE hip flexion 4-/5, ke 3/5 , ankle dorsi/plantar flexion 4/5  Skin: Skin is warm and dry.  Psychiatric: She has a normal mood and affect. Her behavior is normal. Judgment normal      Assessment & Plan:  Medical Problem List and Plan: 1. Left hemiparesis and balance deficits secondary to hypertensive right thalamic and internal capsule hemorrhages  -continue HEP -Outpatient therapies?  -do NOT recommend that she drives 2. DVT Prophylaxis/Anticoagulation: SCDs. Encourage mobility 3. Pain Management: Tylenol as needed -continue voltaren gel for bilateral knees (OA) and right shoulder - return to [facility] for knee brace adjustments? rx given 5. Right shoulder DJD/adhesive capsulitis.  -voltaren gel prn 6. Hypertension. Per primary. 9. Diastolic congestive heart failure: metoprolol, per cards 10. Multiple left hip surgeries.is a long term fall risk    Thirty minutes of face to face patient care time were spent during this visit. All questions were encouraged and answered. Follow up with me pRN

## 2015-12-23 DIAGNOSIS — R270 Ataxia, unspecified: Secondary | ICD-10-CM | POA: Diagnosis not present

## 2015-12-23 DIAGNOSIS — I110 Hypertensive heart disease with heart failure: Secondary | ICD-10-CM | POA: Diagnosis not present

## 2015-12-23 DIAGNOSIS — G8929 Other chronic pain: Secondary | ICD-10-CM | POA: Diagnosis not present

## 2015-12-23 DIAGNOSIS — I5030 Unspecified diastolic (congestive) heart failure: Secondary | ICD-10-CM | POA: Diagnosis not present

## 2015-12-23 DIAGNOSIS — F419 Anxiety disorder, unspecified: Secondary | ICD-10-CM | POA: Diagnosis not present

## 2015-12-23 DIAGNOSIS — S062X0D Diffuse traumatic brain injury without loss of consciousness, subsequent encounter: Secondary | ICD-10-CM | POA: Diagnosis not present

## 2015-12-23 DIAGNOSIS — W108XXD Fall (on) (from) other stairs and steps, subsequent encounter: Secondary | ICD-10-CM | POA: Diagnosis not present

## 2015-12-23 DIAGNOSIS — M4800 Spinal stenosis, site unspecified: Secondary | ICD-10-CM | POA: Diagnosis not present

## 2015-12-23 DIAGNOSIS — K219 Gastro-esophageal reflux disease without esophagitis: Secondary | ICD-10-CM | POA: Diagnosis not present

## 2015-12-28 DIAGNOSIS — I10 Essential (primary) hypertension: Secondary | ICD-10-CM | POA: Diagnosis not present

## 2015-12-28 DIAGNOSIS — M25511 Pain in right shoulder: Secondary | ICD-10-CM | POA: Diagnosis not present

## 2015-12-28 DIAGNOSIS — I5032 Chronic diastolic (congestive) heart failure: Secondary | ICD-10-CM | POA: Diagnosis not present

## 2015-12-28 DIAGNOSIS — I700 Atherosclerosis of aorta: Secondary | ICD-10-CM | POA: Diagnosis not present

## 2015-12-28 DIAGNOSIS — I480 Paroxysmal atrial fibrillation: Secondary | ICD-10-CM | POA: Diagnosis not present

## 2016-01-19 DIAGNOSIS — M7501 Adhesive capsulitis of right shoulder: Secondary | ICD-10-CM | POA: Diagnosis not present

## 2016-01-19 DIAGNOSIS — G8929 Other chronic pain: Secondary | ICD-10-CM | POA: Diagnosis not present

## 2016-01-19 DIAGNOSIS — M25511 Pain in right shoulder: Secondary | ICD-10-CM | POA: Diagnosis not present

## 2016-01-24 ENCOUNTER — Ambulatory Visit: Payer: Self-pay | Admitting: Neurology

## 2016-02-07 DIAGNOSIS — I10 Essential (primary) hypertension: Secondary | ICD-10-CM | POA: Diagnosis not present

## 2016-02-07 DIAGNOSIS — I5032 Chronic diastolic (congestive) heart failure: Secondary | ICD-10-CM | POA: Diagnosis not present

## 2016-02-07 DIAGNOSIS — I4510 Unspecified right bundle-branch block: Secondary | ICD-10-CM | POA: Diagnosis not present

## 2016-02-07 DIAGNOSIS — I700 Atherosclerosis of aorta: Secondary | ICD-10-CM | POA: Diagnosis not present

## 2016-02-07 DIAGNOSIS — I480 Paroxysmal atrial fibrillation: Secondary | ICD-10-CM | POA: Diagnosis not present

## 2016-02-10 ENCOUNTER — Ambulatory Visit: Payer: Self-pay | Admitting: Neurology

## 2016-02-29 ENCOUNTER — Other Ambulatory Visit: Payer: Self-pay | Admitting: Orthopedic Surgery

## 2016-02-29 DIAGNOSIS — M25511 Pain in right shoulder: Principal | ICD-10-CM

## 2016-02-29 DIAGNOSIS — G8929 Other chronic pain: Secondary | ICD-10-CM

## 2016-03-07 ENCOUNTER — Ambulatory Visit
Admission: RE | Admit: 2016-03-07 | Discharge: 2016-03-07 | Disposition: A | Discharge status: Home / Self Care | Payer: PPO | Source: Ambulatory Visit | Attending: Orthopedic Surgery | Admitting: Orthopedic Surgery

## 2016-03-07 DIAGNOSIS — M62511 Muscle wasting and atrophy, not elsewhere classified, right shoulder: Secondary | ICD-10-CM | POA: Diagnosis not present

## 2016-03-07 DIAGNOSIS — M25511 Pain in right shoulder: Secondary | ICD-10-CM | POA: Insufficient documentation

## 2016-03-07 DIAGNOSIS — M7591 Shoulder lesion, unspecified, right shoulder: Secondary | ICD-10-CM | POA: Insufficient documentation

## 2016-03-07 DIAGNOSIS — G8929 Other chronic pain: Secondary | ICD-10-CM | POA: Insufficient documentation

## 2016-03-07 DIAGNOSIS — S43492A Other sprain of left shoulder joint, initial encounter: Secondary | ICD-10-CM | POA: Insufficient documentation

## 2016-03-07 DIAGNOSIS — M19011 Primary osteoarthritis, right shoulder: Secondary | ICD-10-CM | POA: Diagnosis not present

## 2016-04-04 DIAGNOSIS — M19011 Primary osteoarthritis, right shoulder: Secondary | ICD-10-CM | POA: Diagnosis not present

## 2016-04-12 DIAGNOSIS — M19011 Primary osteoarthritis, right shoulder: Secondary | ICD-10-CM | POA: Diagnosis not present

## 2016-04-12 DIAGNOSIS — M23201 Derangement of unspecified lateral meniscus due to old tear or injury, left knee: Secondary | ICD-10-CM | POA: Diagnosis not present

## 2016-04-12 DIAGNOSIS — I6789 Other cerebrovascular disease: Secondary | ICD-10-CM | POA: Diagnosis not present

## 2016-04-12 DIAGNOSIS — M25552 Pain in left hip: Secondary | ICD-10-CM | POA: Diagnosis not present

## 2016-04-19 DIAGNOSIS — I5032 Chronic diastolic (congestive) heart failure: Secondary | ICD-10-CM | POA: Diagnosis not present

## 2016-04-19 DIAGNOSIS — I10 Essential (primary) hypertension: Secondary | ICD-10-CM | POA: Diagnosis not present

## 2016-04-19 DIAGNOSIS — I700 Atherosclerosis of aorta: Secondary | ICD-10-CM | POA: Diagnosis not present

## 2016-04-26 DIAGNOSIS — I10 Essential (primary) hypertension: Secondary | ICD-10-CM | POA: Diagnosis not present

## 2016-04-26 DIAGNOSIS — I700 Atherosclerosis of aorta: Secondary | ICD-10-CM | POA: Diagnosis not present

## 2016-04-26 DIAGNOSIS — I480 Paroxysmal atrial fibrillation: Secondary | ICD-10-CM | POA: Diagnosis not present

## 2016-04-26 DIAGNOSIS — I5032 Chronic diastolic (congestive) heart failure: Secondary | ICD-10-CM | POA: Diagnosis not present

## 2016-05-31 ENCOUNTER — Emergency Department
Admission: EM | Admit: 2016-05-31 | Discharge: 2016-06-01 | Disposition: A | Discharge status: Home / Self Care | Payer: PPO | Attending: Emergency Medicine | Admitting: Emergency Medicine

## 2016-05-31 ENCOUNTER — Encounter: Payer: Self-pay | Admitting: Emergency Medicine

## 2016-05-31 DIAGNOSIS — I10 Essential (primary) hypertension: Secondary | ICD-10-CM | POA: Diagnosis not present

## 2016-05-31 DIAGNOSIS — I110 Hypertensive heart disease with heart failure: Secondary | ICD-10-CM | POA: Diagnosis not present

## 2016-05-31 DIAGNOSIS — Z87891 Personal history of nicotine dependence: Secondary | ICD-10-CM | POA: Insufficient documentation

## 2016-05-31 DIAGNOSIS — Z8673 Personal history of transient ischemic attack (TIA), and cerebral infarction without residual deficits: Secondary | ICD-10-CM | POA: Insufficient documentation

## 2016-05-31 DIAGNOSIS — R7989 Other specified abnormal findings of blood chemistry: Secondary | ICD-10-CM | POA: Diagnosis not present

## 2016-05-31 DIAGNOSIS — M1712 Unilateral primary osteoarthritis, left knee: Secondary | ICD-10-CM | POA: Diagnosis not present

## 2016-05-31 DIAGNOSIS — I5032 Chronic diastolic (congestive) heart failure: Secondary | ICD-10-CM | POA: Diagnosis not present

## 2016-05-31 DIAGNOSIS — I4891 Unspecified atrial fibrillation: Secondary | ICD-10-CM | POA: Diagnosis not present

## 2016-05-31 DIAGNOSIS — R778 Other specified abnormalities of plasma proteins: Secondary | ICD-10-CM

## 2016-05-31 HISTORY — DX: Chronic diastolic (congestive) heart failure: I50.32

## 2016-05-31 HISTORY — DX: Nontraumatic intracerebral hemorrhage, unspecified: I61.9

## 2016-05-31 HISTORY — DX: Atherosclerosis of aorta: I70.0

## 2016-05-31 HISTORY — DX: Unspecified atrial fibrillation: I48.91

## 2016-05-31 NOTE — ED Notes (Addendum)
Patient presents to ED with c/o hypertension, reports "feeling hot," neck pain since yesterday. Pt reported 2 days ago she had "some vision problems." denies any vision problems at this time. Pt denies chest pain or shortness of breath. Pt speaking in complete sentences. Pt has history of stroke in Nov 2016. Reports home bp readings: 190/67 and 184/62

## 2016-06-01 LAB — URINALYSIS COMPLETE WITH MICROSCOPIC (ARMC ONLY)
Bacteria, UA: NONE SEEN
Bilirubin Urine: NEGATIVE
Glucose, UA: NEGATIVE mg/dL
Hgb urine dipstick: NEGATIVE
KETONES UR: NEGATIVE mg/dL
NITRITE: NEGATIVE
PROTEIN: 100 mg/dL — AB
RBC / HPF: NONE SEEN RBC/hpf (ref 0–5)
SPECIFIC GRAVITY, URINE: 1.009 (ref 1.005–1.030)
pH: 6 (ref 5.0–8.0)

## 2016-06-01 LAB — CBC WITH DIFFERENTIAL/PLATELET
BASOS ABS: 0.1 10*3/uL (ref 0–0.1)
BASOS PCT: 1 %
Eosinophils Absolute: 0 10*3/uL (ref 0–0.7)
Eosinophils Relative: 0 %
HEMATOCRIT: 39.6 % (ref 35.0–47.0)
HEMOGLOBIN: 13.9 g/dL (ref 12.0–16.0)
Lymphocytes Relative: 26 %
Lymphs Abs: 2.1 10*3/uL (ref 1.0–3.6)
MCH: 32.4 pg (ref 26.0–34.0)
MCHC: 35.1 g/dL (ref 32.0–36.0)
MCV: 92.3 fL (ref 80.0–100.0)
MONO ABS: 0.8 10*3/uL (ref 0.2–0.9)
Monocytes Relative: 10 %
NEUTROS ABS: 5.1 10*3/uL (ref 1.4–6.5)
NEUTROS PCT: 63 %
Platelets: 240 10*3/uL (ref 150–440)
RBC: 4.29 MIL/uL (ref 3.80–5.20)
RDW: 13.5 % (ref 11.5–14.5)
WBC: 8.1 10*3/uL (ref 3.6–11.0)

## 2016-06-01 LAB — BASIC METABOLIC PANEL
Anion gap: 9 (ref 5–15)
BUN: 24 mg/dL — ABNORMAL HIGH (ref 6–20)
CO2: 28 mmol/L (ref 22–32)
Calcium: 10 mg/dL (ref 8.9–10.3)
Chloride: 90 mmol/L — ABNORMAL LOW (ref 101–111)
Creatinine, Ser: 0.98 mg/dL (ref 0.44–1.00)
GFR calc Af Amer: 58 mL/min — ABNORMAL LOW (ref >60)
GFR calc non Af Amer: 50 mL/min — ABNORMAL LOW (ref >60)
Glucose, Bld: 101 mg/dL — ABNORMAL HIGH (ref 65–99)
Potassium: 4.4 mmol/L (ref 3.5–5.1)
Sodium: 127 mmol/L — ABNORMAL LOW (ref 135–145)

## 2016-06-01 LAB — TROPONIN I
Troponin I: <0.03 ng/mL (ref <0.03)
Troponin I: 0.05 ng/mL (ref <0.03)
Troponin I: 0.05 ng/mL (ref <0.03)

## 2016-06-01 MED ORDER — METOPROLOL TARTRATE 25 MG PO TABS
25.0000 mg | ORAL_TABLET | Freq: Once | ORAL | Status: AC
Start: 2016-06-01 — End: 2016-06-01
  Administered 2016-06-01: 25 mg via ORAL

## 2016-06-01 MED ORDER — METOPROLOL TARTRATE 25 MG PO TABS
ORAL_TABLET | ORAL | Status: AC
Start: 2016-06-01 — End: 2016-06-01
  Administered 2016-06-01: 25 mg via ORAL
  Filled 2016-06-01: qty 1

## 2016-06-01 MED ORDER — ASPIRIN 81 MG PO CHEW: 324.0000 mg | CHEWABLE_TABLET | Freq: Once | ORAL | Status: DC

## 2016-06-01 NOTE — ED Notes (Signed)
Pt sleeping on stretcher, blanket covering pt. Lights dimmed for comfort.

## 2016-06-01 NOTE — ED Provider Notes (Signed)
-----------------------------------------   9:38 AM on 06/01/2016 -----------------------------------------   Blood pressure 192/47, pulse 61, temperature 98.2 F (36.8 C), temperature source Oral, resp. rate 15, height 5' (1.524 m), weight 130 lb (58.968 kg), SpO2 95 %.  Assuming care from Dr. Karma Greaser.  In short, Theresa Harris is a 82 y.o. female with a chief complaint of Hypertension .  Refer to the original H&P for additional details. Patient has a history of hypertension and anxiety. Presented with chest pain and found to have elevated BP. EKG was  nonischemic. First troponin negative. Second troponin mildly elevated at 0.05. Attempt to admit patient by Dr. Karma Greaser however patient declined admission. Plan to repeat a third troponin and reevaluate.  ----------------------------------------- 9:39 AM on 06/01/2016 -----------------------------------------  Patient's repeat troponin remained unchanged at 0.05. Patient no longer having chest pain since last night when she first arrived. Patient is adamant about leaving the hospital. I contacted the patient's cardiologist Dr. Ubaldo Glassing who will see patient today in his office for close follow-up. Patient given her home meds for antihypertension. Will dc home with close follow up.  Rudene Re, MD 06/01/16 [phone]

## 2016-06-01 NOTE — Discharge Instructions (Signed)
Please follow-up with your cardiologist today. Return to the emergency department if she have new chest pain, dizziness, shortness of breath, or any new symptoms concerning to you.   Return to the Emergency Department (ED) if you experience any worsening chest pain/pressure/tightness, difficulty breathing, or sudden sweating, or other symptoms that concern you.   Hypertension Hypertension, commonly called high blood pressure, is when the force of blood pumping through your arteries is too strong. Your arteries are the blood vessels that carry blood from your heart throughout your body. A blood pressure reading consists of a higher number over a lower number, such as 110/72. The higher number (systolic) is the pressure inside your arteries when your heart pumps. The lower number (diastolic) is the pressure inside your arteries when your heart relaxes. Ideally you want your blood pressure below 120/80. Hypertension forces your heart to work harder to pump blood. Your arteries may become narrow or stiff. Having hypertension puts you at risk for heart disease, stroke, and other problems.  RISK FACTORS Some risk factors for high blood pressure are controllable. Others are not.  Risk factors you cannot control include:   Race. You may be at higher risk if you are African American.  Age. Risk increases with age.  Gender. Men are at higher risk than women before age 62 years. After age 19, women are at higher risk than men. Risk factors you can control include:  Not getting enough exercise or physical activity.  Being overweight.  Getting too much fat, sugar, calories, or salt in your diet.  Drinking too much alcohol. SIGNS AND SYMPTOMS Hypertension does not usually cause signs or symptoms. Extremely high blood pressure (hypertensive crisis) may cause headache, anxiety, shortness of breath, and nosebleed. DIAGNOSIS  To check if you have hypertension, your health care provider will measure your  blood pressure while you are seated, with your arm held at the level of your heart. It should be measured at least twice using the same arm. Certain conditions can cause a difference in blood pressure between your right and left arms. A blood pressure reading that is higher than normal on one occasion does not mean that you need treatment. If one blood pressure reading is high, ask your health care provider about having it checked again. TREATMENT  Treating high blood pressure includes making lifestyle changes and possibly taking medicine. Living a healthy lifestyle can help lower high blood pressure. You may need to change some of your habits. Lifestyle changes may include:  Following the DASH diet. This diet is high in fruits, vegetables, and whole grains. It is low in salt, red meat, and added sugars.  Getting at least 2 hours of brisk physical activity every week.  Losing weight if necessary.  Not smoking.  Limiting alcoholic beverages.  Learning ways to reduce stress. If lifestyle changes are not enough to get your blood pressure under control, your health care provider may prescribe medicine. You may need to take more than one. Work closely with your health care provider to understand the risks and benefits. HOME CARE INSTRUCTIONS  Have your blood pressure rechecked as directed by your health care provider.   Take medicines only as directed by your health care provider. Follow the directions carefully. Blood pressure medicines must be taken as prescribed. The medicine does not work as well when you skip doses. Skipping doses also puts you at risk for problems.   Do not smoke.   Monitor your blood pressure at home as directed  by your health care provider. SEEK MEDICAL CARE IF:   You think you are having a reaction to medicines taken.  You have recurrent headaches or feel dizzy.  You have swelling in your ankles.  You have trouble with your vision. SEEK IMMEDIATE MEDICAL  CARE IF:  You develop a severe headache or confusion.  You have unusual weakness, numbness, or feel faint.  You have severe chest or abdominal pain.  You vomit repeatedly.  You have trouble breathing. MAKE SURE YOU:   Understand these instructions.  Will watch your condition.  Will get help right away if you are not doing well or get worse. Document Released: 11/13/2005 Document Revised: 03/30/2014 Document Reviewed: 09/05/2013 [facility] Patient Information 2015 [place], [place]. This information is not intended to replace advice given to you by your health care provider. Make sure you discuss any questions you have with your health care provider.  How to Take Your Blood Pressure HOW DO I GET A BLOOD PRESSURE MACHINE?  You can buy an electronic home blood pressure machine at your local pharmacy. Insurance will sometimes cover the cost if you have a prescription.  Ask your doctor what type of machine is best for you. There are different machines for your arm and your wrist.  If you decide to buy a machine to check your blood pressure on your arm, first check the size of your arm so you can buy the right size cuff. To check the size of your arm:   Use a measuring tape that shows both inches and centimeters.   Wrap the measuring tape around the upper-middle part of your arm. You may need someone to help you measure.   Write down your arm measurement in both inches and centimeters.   To measure your blood pressure correctly, it is important to have the right size cuff.   If your arm is up to 13 inches (up to 34 centimeters), get an adult cuff size.  If your arm is 13 to 17 inches (35 to 44 centimeters), get a large adult cuff size.    If your arm is 17 to 20 inches (45 to 52 centimeters), get an adult thigh cuff.  WHAT DO THE NUMBERS MEAN?   There are two numbers that make up your blood pressure. For example: 120/80.  The first number (120 in our example) is called the  "systolic pressure." It is a measure of the pressure in your blood vessels when your heart is pumping blood.  The second number (80 in our example) is called the "diastolic pressure." It is a measure of the pressure in your blood vessels when your heart is resting between beats.  Your doctor will tell you what your blood pressure should be. WHAT SHOULD I DO BEFORE I CHECK MY BLOOD PRESSURE?   Try to rest or relax for at least 30 minutes before you check your blood pressure.  Do not smoke.  Do not have any drinks with caffeine, such as:  Soda.  Coffee.  Tea.  Check your blood pressure in a quiet room.  Sit down and stretch out your arm on a table. Keep your arm at about the level of your heart. Let your arm relax.  Make sure that your legs are not crossed. HOW DO I CHECK MY BLOOD PRESSURE?  Follow the directions that came with your machine.  Make sure you remove any tight-fighting clothing from your arm or wrist. Wrap the cuff around your upper arm or wrist. You  should be able to fit a finger between the cuff and your arm. If you cannot fit a finger between the cuff and your arm, it is too tight and should be removed and rewrapped.  Some units require you to manually pump up the arm cuff.  Automatic units inflate the cuff when you press a button.  Cuff deflation is automatic in both models.  After the cuff is inflated, the unit measures your blood pressure and pulse. The readings are shown on a monitor. Hold still and breathe normally while the cuff is inflated.  Getting a reading takes less than a minute.  Some models store readings in a memory. Some provide a printout of readings. If your machine does not store your readings, keep a written record.  Take readings with you to your next visit with your doctor. Document Released: 10/26/2008 Document Revised: 03/30/2014 Document Reviewed: 01/08/2014 [facility] Patient Information 2015 [place], [place]. This information is not  intended to replace advice given to you by your health care provider. Make sure you discuss any questions you have with your health care provider.

## 2016-06-01 NOTE — ED Notes (Signed)
Dr. Forbach at bedside.  

## 2016-06-01 NOTE — ED Notes (Signed)
Pt sleeping on her R side, blanket covering her. Head of the bed low with pillow underneath head.

## 2016-06-01 NOTE — ED Notes (Signed)
Pt speaking with daughter on the phone.. Pt refuses aspirin states she has a hx of ischemic stroke and was told to never take aspirin again.

## 2016-06-01 NOTE — ED Notes (Addendum)
Pt states BP at home 225/unknown. Pt states she didn't even look at bottom number because so concerned about top number. Pt denies headache, blurred vision. Pt states BP normally around 140's but has been rising for 2 days now. Denies chest pain, then later comes back and states she has tightness in chest. States "some" shortness of breath. Pt is talking in complete sentences, no distress noted.   Pt is talking about medical history, going on about her L hip with screws and wants it get an x-ray. States she had a stroke in November 2016. Pt continues to talk about hip and stroke. States she ambulates with a cane.   Pt states she feels anxious and is requesting she take her xanax from home. Pt states she is shaking because she is anxious. States she drove herself to ED.

## 2016-06-01 NOTE — ED Notes (Signed)
Pt sitting up at the side of the bed eating sandwich meal.. Pt states she wishes to take her 9am home medications that she has with her, MD notified.Marland Kitchen

## 2016-06-01 NOTE — ED Notes (Signed)
Pt asleep on stretcher with lights dimmed and blanket covering pt. Cardiac monitor on to monitor pt HR, BP cuff on to monitor pt BP since medicine was given.

## 2016-06-01 NOTE — ED Notes (Signed)
Pt rolled on to R side and pulled blanket up to cover herself.

## 2016-06-01 NOTE — ED Provider Notes (Signed)
[facility] Emergency Department Provider Note  ____________________________________________  Time seen: Approximately 12:49 AM  I have reviewed the triage vital signs and the nursing notes.   HISTORY  Chief Complaint Hypertension    HPI Theresa Harris is a 82 y.o. female with a history that includes hypertension and atrial fibrillation who presents for evaluation of her high blood pressure.  She reports that she has checked her blood pressure numerous times today and that each time she checks it goes a little bit higher.  She went to a nearby facility, possibly a pharmacy, that has another blood pressure machine and was concerned when her number was in the 190s.  At no point did she have any chest pain, shortness of breath, nausea, vomiting, abdominal pain, or dysuria.  She reports that she had a mild throbbing headache earlier today which is since resolved.  She tried calling her cardiologist, Dr. Ubaldo Glassing, but was told that she would not be able to be seen today.  Later in the evening when her blood pressure continued to be high she thought she should come to the emergency department for further evaluation.  After being placed in a room she became very nervous and upset when she felt some chest pressure that was new for her.  She reported that she cannot find the [place] and became increasingly worried that she was going to have a heart attack and die in the room with no one aware of what was happening. Her blood pressure at that point was elevated as far as 234/65.  Her nurse checked on her and reassured her that she was on the monitor and being watched, and the patient was allowed to take her own Xanax.  I saw her shortly thereafter and she reported that the tightness in her chest was gone and that she was starting to feel better but was still worried about her heart and about her blood pressure.  She reports that her systolic blood pressure is usually in the 140s.   She has not had any recent illnesses.  She reports that her blood pressure elevation was severe but that her symptoms were relatively mild until she had what she herself describes as a "panic attack" while in the exam room.   Past Medical History  Diagnosis Date  . Spinal stenosis   . CHF (congestive heart failure) ([place])   . Anxiety   . HTN (hypertension)   . Atrial fibrillation ([place])   . Hemorrhagic stroke ([place])   . Atherosclerosis of abdominal aorta ([place])   . Chronic diastolic heart failure [facility])     Patient Active Problem List   Diagnosis Date Noted  . Osteoarthritis of left knee 12/21/2015  . Nontraumatic subcortical hemorrhage of right cerebral hemisphere ([place])   . Chronic diastolic congestive heart failure ([place]) 10/28/2015  . Essential hypertension 10/28/2015  . Ataxia   . Sensation alteration, late effect of cerebrovascular disease   . Aneurysm, cerebral, nonruptured 10/26/2015  . Meningioma ([place]) 10/26/2015  . ICH (intracerebral hemorrhage) ([place]) 10/22/2015    Past Surgical History  Procedure Laterality Date  . Knee surgery      right  . Abdominal hysterectomy    . Hip surgery    . Cholecystectomy    . Carpal tunnel release      Current Outpatient Rx  Name  Route  Sig  Dispense  Refill  . acetaminophen (TYLENOL) 500 MG tablet   Oral   Take 500 mg by mouth  every 6 (six) hours as needed for mild pain.         . Calcium Carbonate-Vitamin D (CALCIUM 600+D) 600-400 MG-UNIT tablet   Oral   Take 1 tablet by mouth 2 (two) times daily.         Marland Kitchen estradiol (ESTRACE) 1 MG tablet   Oral   Take 0.5 tablets (0.5 mg total) by mouth daily.   30 tablet   1   . fluticasone (FLONASE) 50 MCG/ACT nasal spray   Each Nare   Place 2 sprays into both nostrils daily.         Marland Kitchen losartan-hydrochlorothiazide (HYZAAR) 100-12.5 MG tablet   Oral   Take 1 tablet by mouth daily.         . magnesium oxide (MAG-OX) 400 MG tablet   Oral   Take 1 tablet (400 mg total) by  mouth daily.   30 tablet   1   . metoprolol succinate (TOPROL-XL) 100 MG 24 hr tablet   Oral   Take 100 mg by mouth daily. Take with or immediately following a meal.         . omeprazole (PRILOSEC) 20 MG capsule   Oral   Take 1 capsule (20 mg total) by mouth 2 (two) times daily.   60 capsule   1   . oxazepam (SERAX) 15 MG capsule   Oral   Take 1 capsule by mouth 3 (three) times daily as needed.         Marland Kitchen tiZANidine (ZANAFLEX) 2 MG tablet      1 mg bedtime and every 8 hours as needed   60 tablet   0     Allergies Morphine and related  No family history on file.  Social History Social History  Substance Use Topics  . Smoking status: Former Research scientist (life sciences)  . Smokeless tobacco: None  . Alcohol Use: No    Review of Systems Constitutional: No fever/chills Eyes: No visual changes. ENT: No sore throat. Cardiovascular: Denies chest pain But did have some chest pressure when she got very nervous. Respiratory: Denies shortness of breath. Gastrointestinal: No abdominal pain.  No nausea, no vomiting.  No diarrhea.  No constipation. Genitourinary: Negative for dysuria. Musculoskeletal: Negative for back pain. Skin: Negative for rash. Neurological: Mild throbbing headache earlier today which is now resolved  10-point ROS otherwise negative.  ____________________________________________   PHYSICAL EXAM:  VITAL SIGNS: ED Triage Vitals  Enc Vitals Group     BP 05/31/16 2340 233/67 mmHg     Pulse Rate 05/31/16 2340 76     Resp 05/31/16 2340 18     Temp 05/31/16 2340 98.2 F (36.8 C)     Temp Source 05/31/16 2340 Oral     SpO2 05/31/16 2340 98 %     Weight 05/31/16 2340 130 lb (58.968 kg)     Height 05/31/16 2340 5' (1.524 m)     Head Cir --      Peak Flow --      Pain Score --      Pain Loc --      Pain Edu? --      Excl. in [place]? --     Constitutional: Alert and oriented. Well appearing and in no acute distress. Eyes: Conjunctivae are normal. PERRL. EOMI. Head:  Atraumatic. Nose: No congestion/rhinnorhea. Mouth/Throat: Mucous membranes are moist.  Oropharynx non-erythematous. Neck: No stridor.  No meningeal signs.   Cardiovascular: Normal rate, regular rhythm. Good peripheral circulation. Grossly normal heart sounds.  Respiratory: Normal respiratory effort.  No retractions. Lungs CTAB. Gastrointestinal: Soft and nontender. No distention.  Musculoskeletal: No lower extremity tenderness nor edema. No gross deformities of extremities. Neurologic:  Normal speech and language. No gross focal neurologic deficits are appreciated.  Skin:  Skin is warm, dry and intact. No rash noted. Psychiatric: Mood and affect are Anxious but generally appropriate.  ____________________________________________   LABS (all labs ordered are listed, but only abnormal results are displayed)  Labs Reviewed  BASIC METABOLIC PANEL - Abnormal; Notable for the following:    Sodium 127 (*)    Chloride 90 (*)    Glucose, Bld 101 (*)    BUN 24 (*)    GFR calc non Af Amer 50 (*)    GFR calc Af Amer 58 (*)    All other components within normal limits  URINALYSIS COMPLETEWITH MICROSCOPIC ([facility] ONLY) - Abnormal; Notable for the following:    Color, Urine STRAW (*)    APPearance CLEAR (*)    Protein, ur 100 (*)    Leukocytes, UA TRACE (*)    Squamous Epithelial / LPF 0-5 (*)    All other components within normal limits  TROPONIN I - Abnormal; Notable for the following:    Troponin I 0.05 (*)    All other components within normal limits  CBC WITH DIFFERENTIAL/PLATELET  TROPONIN I  TROPONIN I   ____________________________________________  EKG  ED ECG REPORT I, Jacson Rapaport, the attending physician, personally viewed and interpreted this ECG.   Date: 05/31/2016  EKG Time: 23:56  Rate: 78  Rhythm: atrial fibrillation, rate 78  Axis: Left axis deviation  Intervals:right bundle branch block and left anterior fascicular block  ST&T Change: Non-specific ST segment /  T-wave changes, but no evidence of acute ischemia.  The EKG is not substantially changed in morphology from her last EKG on record.  ____________________________________________  RADIOLOGY   No results found.  ____________________________________________   PROCEDURES  Procedure(s) performed:   Procedures   ____________________________________________   INITIAL IMPRESSION / ASSESSMENT AND PLAN / ED COURSE  Pertinent labs & imaging results that were available during my care of the patient were reviewed by me and considered in my medical decision making (see chart for details).  The patient was extremely anxious when she first arrived which I believe was driving up her blood pressure significantly.  After I spoke with her and calmed her down and she took her Xanax, we watched her for hours and her blood pressure came down very nicely within an acceptable range.  She had a negative troponin, but given the acute onset of chest tightness and the persistently elevated blood pressure, I will check a second set and reassess.  ----------------------------------------- 5:37 AM on 06/01/2016 -----------------------------------------  The patient's blood pressure elevated back to greater than 166 systolic.  As a result I gave her metoprolol tartrate 25 mg by mouth in attempt to control it.  Her heart rate was in the 80s at the time and I felt that she could tolerate that dose.  Subsequently checked on her and she says she feels much better all over and is resting comfortably with a heart rate in the upper 40s and a blood pressure in the 063K systolic.  Unfortunately her second troponin was slightly elevated at 0.05.  I discussed this with her and recommended that I admit her for hypertensive urgency with an elevated troponin likely due to a stress leak, but she adamantly declines admission and says that she already  owes money to [facility] and does not want another bill for hospitalization.  As  a result I will order a third troponin to make sure it is at least stable if not trending downward.  I will not intervene anymore and her blood pressure since she is now bradycardic and her blood pressure is an appropriate range.  ----------------------------------------- 7:23 AM on 06/01/2016 -----------------------------------------  Transferring ED care to Dr. Alfred Levins to check third troponin and reassess. ____________________________________________  FINAL CLINICAL IMPRESSION(S) / ED DIAGNOSES  Final diagnoses:  Essential hypertension  Elevated troponin I level     MEDICATIONS GIVEN DURING THIS VISIT:  Medications  aspirin chewable tablet 324 mg (not administered)  metoprolol tartrate (LOPRESSOR) tablet 25 mg (25 mg Oral Given 06/01/16 0452)     NEW OUTPATIENT MEDICATIONS STARTED DURING THIS VISIT:  New Prescriptions   No medications on file      Note:  This document was prepared using Dragon voice recognition software and may include unintentional dictation errors.   Hinda Kehr, MD 06/01/16 [phone]

## 2016-06-02 DIAGNOSIS — I4510 Unspecified right bundle-branch block: Secondary | ICD-10-CM | POA: Diagnosis not present

## 2016-06-02 DIAGNOSIS — I5032 Chronic diastolic (congestive) heart failure: Secondary | ICD-10-CM | POA: Diagnosis not present

## 2016-06-02 DIAGNOSIS — I700 Atherosclerosis of aorta: Secondary | ICD-10-CM | POA: Diagnosis not present

## 2016-06-02 DIAGNOSIS — I10 Essential (primary) hypertension: Secondary | ICD-10-CM | POA: Diagnosis not present

## 2016-06-02 DIAGNOSIS — I480 Paroxysmal atrial fibrillation: Secondary | ICD-10-CM | POA: Diagnosis not present

## 2016-06-06 DIAGNOSIS — M25552 Pain in left hip: Secondary | ICD-10-CM | POA: Diagnosis not present

## 2016-06-06 DIAGNOSIS — I5032 Chronic diastolic (congestive) heart failure: Secondary | ICD-10-CM | POA: Diagnosis not present

## 2016-06-19 DIAGNOSIS — H26493 Other secondary cataract, bilateral: Secondary | ICD-10-CM | POA: Diagnosis not present

## 2016-07-03 DIAGNOSIS — I480 Paroxysmal atrial fibrillation: Secondary | ICD-10-CM | POA: Diagnosis not present

## 2016-07-03 DIAGNOSIS — I4510 Unspecified right bundle-branch block: Secondary | ICD-10-CM | POA: Diagnosis not present

## 2016-07-03 DIAGNOSIS — I5032 Chronic diastolic (congestive) heart failure: Secondary | ICD-10-CM | POA: Diagnosis not present

## 2016-07-03 DIAGNOSIS — R0602 Shortness of breath: Secondary | ICD-10-CM | POA: Diagnosis not present

## 2016-07-03 DIAGNOSIS — R600 Localized edema: Secondary | ICD-10-CM | POA: Diagnosis not present

## 2016-07-20 DIAGNOSIS — L739 Follicular disorder, unspecified: Secondary | ICD-10-CM | POA: Diagnosis not present

## 2016-07-20 DIAGNOSIS — I8310 Varicose veins of unspecified lower extremity with inflammation: Secondary | ICD-10-CM | POA: Diagnosis not present

## 2016-07-20 DIAGNOSIS — L821 Other seborrheic keratosis: Secondary | ICD-10-CM | POA: Diagnosis not present

## 2016-07-20 DIAGNOSIS — L570 Actinic keratosis: Secondary | ICD-10-CM | POA: Diagnosis not present

## 2016-07-20 DIAGNOSIS — L720 Epidermal cyst: Secondary | ICD-10-CM | POA: Diagnosis not present

## 2016-07-20 DIAGNOSIS — L820 Inflamed seborrheic keratosis: Secondary | ICD-10-CM | POA: Diagnosis not present

## 2016-07-20 DIAGNOSIS — Z85828 Personal history of other malignant neoplasm of skin: Secondary | ICD-10-CM | POA: Diagnosis not present

## 2016-07-24 DIAGNOSIS — R0602 Shortness of breath: Secondary | ICD-10-CM | POA: Diagnosis not present

## 2016-08-14 DIAGNOSIS — I10 Essential (primary) hypertension: Secondary | ICD-10-CM | POA: Diagnosis not present

## 2016-08-14 DIAGNOSIS — I5032 Chronic diastolic (congestive) heart failure: Secondary | ICD-10-CM | POA: Diagnosis not present

## 2016-08-14 DIAGNOSIS — I700 Atherosclerosis of aorta: Secondary | ICD-10-CM | POA: Diagnosis not present

## 2016-08-14 DIAGNOSIS — I480 Paroxysmal atrial fibrillation: Secondary | ICD-10-CM | POA: Diagnosis not present

## 2016-08-14 DIAGNOSIS — I4510 Unspecified right bundle-branch block: Secondary | ICD-10-CM | POA: Diagnosis not present

## 2016-08-25 DIAGNOSIS — K581 Irritable bowel syndrome with constipation: Secondary | ICD-10-CM | POA: Diagnosis not present

## 2016-08-25 DIAGNOSIS — I480 Paroxysmal atrial fibrillation: Secondary | ICD-10-CM | POA: Diagnosis not present

## 2016-08-25 DIAGNOSIS — K219 Gastro-esophageal reflux disease without esophagitis: Secondary | ICD-10-CM | POA: Diagnosis not present

## 2016-09-05 DIAGNOSIS — K219 Gastro-esophageal reflux disease without esophagitis: Secondary | ICD-10-CM | POA: Diagnosis not present

## 2016-09-05 DIAGNOSIS — K581 Irritable bowel syndrome with constipation: Secondary | ICD-10-CM | POA: Diagnosis not present

## 2016-10-26 ENCOUNTER — Other Ambulatory Visit: Payer: Self-pay | Admitting: Internal Medicine

## 2016-10-26 DIAGNOSIS — F411 Generalized anxiety disorder: Secondary | ICD-10-CM | POA: Diagnosis not present

## 2016-10-26 DIAGNOSIS — Z0000 Encounter for general adult medical examination without abnormal findings: Secondary | ICD-10-CM | POA: Diagnosis not present

## 2016-10-26 DIAGNOSIS — R1310 Dysphagia, unspecified: Secondary | ICD-10-CM

## 2016-10-26 DIAGNOSIS — I700 Atherosclerosis of aorta: Secondary | ICD-10-CM | POA: Diagnosis not present

## 2016-10-26 DIAGNOSIS — I5032 Chronic diastolic (congestive) heart failure: Secondary | ICD-10-CM | POA: Diagnosis not present

## 2016-10-26 DIAGNOSIS — I10 Essential (primary) hypertension: Secondary | ICD-10-CM | POA: Diagnosis not present

## 2016-10-26 DIAGNOSIS — I480 Paroxysmal atrial fibrillation: Secondary | ICD-10-CM | POA: Diagnosis not present

## 2016-11-08 ENCOUNTER — Ambulatory Visit
Admission: RE | Admit: 2016-11-08 | Discharge: 2016-11-08 | Disposition: A | Discharge status: Home / Self Care | Payer: PPO | Source: Ambulatory Visit | Attending: Internal Medicine | Admitting: Internal Medicine

## 2016-11-08 DIAGNOSIS — K449 Diaphragmatic hernia without obstruction or gangrene: Secondary | ICD-10-CM | POA: Diagnosis not present

## 2016-11-08 DIAGNOSIS — K228 Other specified diseases of esophagus: Secondary | ICD-10-CM | POA: Insufficient documentation

## 2016-11-08 DIAGNOSIS — R1310 Dysphagia, unspecified: Secondary | ICD-10-CM

## 2016-12-12 DIAGNOSIS — Z85828 Personal history of other malignant neoplasm of skin: Secondary | ICD-10-CM | POA: Diagnosis not present

## 2016-12-12 DIAGNOSIS — I8311 Varicose veins of right lower extremity with inflammation: Secondary | ICD-10-CM | POA: Diagnosis not present

## 2016-12-12 DIAGNOSIS — L853 Xerosis cutis: Secondary | ICD-10-CM | POA: Diagnosis not present

## 2016-12-12 DIAGNOSIS — L820 Inflamed seborrheic keratosis: Secondary | ICD-10-CM | POA: Diagnosis not present

## 2016-12-12 DIAGNOSIS — L720 Epidermal cyst: Secondary | ICD-10-CM | POA: Diagnosis not present

## 2017-01-29 DIAGNOSIS — H0019 Chalazion unspecified eye, unspecified eyelid: Secondary | ICD-10-CM | POA: Diagnosis not present

## 2017-02-21 DIAGNOSIS — I671 Cerebral aneurysm, nonruptured: Secondary | ICD-10-CM | POA: Diagnosis not present

## 2017-02-21 DIAGNOSIS — I480 Paroxysmal atrial fibrillation: Secondary | ICD-10-CM | POA: Diagnosis not present

## 2017-02-21 DIAGNOSIS — I5032 Chronic diastolic (congestive) heart failure: Secondary | ICD-10-CM | POA: Diagnosis not present

## 2017-02-21 DIAGNOSIS — I10 Essential (primary) hypertension: Secondary | ICD-10-CM | POA: Diagnosis not present

## 2017-02-21 DIAGNOSIS — R0789 Other chest pain: Secondary | ICD-10-CM | POA: Diagnosis not present

## 2017-02-21 DIAGNOSIS — I4510 Unspecified right bundle-branch block: Secondary | ICD-10-CM | POA: Diagnosis not present

## 2017-04-18 DIAGNOSIS — I10 Essential (primary) hypertension: Secondary | ICD-10-CM | POA: Diagnosis not present

## 2017-04-18 DIAGNOSIS — I5032 Chronic diastolic (congestive) heart failure: Secondary | ICD-10-CM | POA: Diagnosis not present

## 2017-04-18 DIAGNOSIS — I480 Paroxysmal atrial fibrillation: Secondary | ICD-10-CM | POA: Diagnosis not present

## 2017-04-18 DIAGNOSIS — I700 Atherosclerosis of aorta: Secondary | ICD-10-CM | POA: Diagnosis not present

## 2017-04-25 DIAGNOSIS — I700 Atherosclerosis of aorta: Secondary | ICD-10-CM | POA: Diagnosis not present

## 2017-04-25 DIAGNOSIS — I480 Paroxysmal atrial fibrillation: Secondary | ICD-10-CM | POA: Diagnosis not present

## 2017-04-25 DIAGNOSIS — I5032 Chronic diastolic (congestive) heart failure: Secondary | ICD-10-CM | POA: Diagnosis not present

## 2017-04-25 DIAGNOSIS — M810 Age-related osteoporosis without current pathological fracture: Secondary | ICD-10-CM | POA: Diagnosis not present

## 2017-04-25 DIAGNOSIS — I10 Essential (primary) hypertension: Secondary | ICD-10-CM | POA: Diagnosis not present

## 2017-05-11 DIAGNOSIS — I5032 Chronic diastolic (congestive) heart failure: Secondary | ICD-10-CM | POA: Diagnosis not present

## 2017-05-11 DIAGNOSIS — I10 Essential (primary) hypertension: Secondary | ICD-10-CM | POA: Diagnosis not present

## 2017-05-11 DIAGNOSIS — I480 Paroxysmal atrial fibrillation: Secondary | ICD-10-CM | POA: Diagnosis not present

## 2017-05-11 DIAGNOSIS — I700 Atherosclerosis of aorta: Secondary | ICD-10-CM | POA: Diagnosis not present

## 2017-05-11 DIAGNOSIS — M810 Age-related osteoporosis without current pathological fracture: Secondary | ICD-10-CM | POA: Diagnosis not present

## 2017-05-11 DIAGNOSIS — K581 Irritable bowel syndrome with constipation: Secondary | ICD-10-CM | POA: Diagnosis not present

## 2017-06-21 DIAGNOSIS — K130 Diseases of lips: Secondary | ICD-10-CM | POA: Diagnosis not present

## 2017-06-21 DIAGNOSIS — L821 Other seborrheic keratosis: Secondary | ICD-10-CM | POA: Diagnosis not present

## 2017-06-21 DIAGNOSIS — L603 Nail dystrophy: Secondary | ICD-10-CM | POA: Diagnosis not present

## 2017-06-21 DIAGNOSIS — L578 Other skin changes due to chronic exposure to nonionizing radiation: Secondary | ICD-10-CM | POA: Diagnosis not present

## 2017-06-21 DIAGNOSIS — L820 Inflamed seborrheic keratosis: Secondary | ICD-10-CM | POA: Diagnosis not present

## 2017-06-21 DIAGNOSIS — L812 Freckles: Secondary | ICD-10-CM | POA: Diagnosis not present

## 2017-07-16 DIAGNOSIS — H6123 Impacted cerumen, bilateral: Secondary | ICD-10-CM | POA: Diagnosis not present

## 2017-07-16 DIAGNOSIS — H93293 Other abnormal auditory perceptions, bilateral: Secondary | ICD-10-CM | POA: Diagnosis not present

## 2017-07-20 ENCOUNTER — Encounter: Payer: Self-pay | Admitting: Emergency Medicine

## 2017-07-20 ENCOUNTER — Emergency Department: Payer: PPO

## 2017-07-20 ENCOUNTER — Emergency Department
Admission: EM | Admit: 2017-07-20 | Discharge: 2017-07-20 | Disposition: A | Discharge status: Home / Self Care | Payer: PPO | Attending: Emergency Medicine | Admitting: Emergency Medicine

## 2017-07-20 DIAGNOSIS — K449 Diaphragmatic hernia without obstruction or gangrene: Secondary | ICD-10-CM | POA: Diagnosis not present

## 2017-07-20 DIAGNOSIS — Z87891 Personal history of nicotine dependence: Secondary | ICD-10-CM | POA: Insufficient documentation

## 2017-07-20 DIAGNOSIS — I671 Cerebral aneurysm, nonruptured: Secondary | ICD-10-CM | POA: Diagnosis not present

## 2017-07-20 DIAGNOSIS — I700 Atherosclerosis of aorta: Secondary | ICD-10-CM | POA: Diagnosis not present

## 2017-07-20 DIAGNOSIS — R109 Unspecified abdominal pain: Secondary | ICD-10-CM | POA: Insufficient documentation

## 2017-07-20 DIAGNOSIS — I110 Hypertensive heart disease with heart failure: Secondary | ICD-10-CM | POA: Insufficient documentation

## 2017-07-20 DIAGNOSIS — I5032 Chronic diastolic (congestive) heart failure: Secondary | ICD-10-CM | POA: Diagnosis not present

## 2017-07-20 DIAGNOSIS — M62830 Muscle spasm of back: Secondary | ICD-10-CM | POA: Insufficient documentation

## 2017-07-20 DIAGNOSIS — M549 Dorsalgia, unspecified: Secondary | ICD-10-CM | POA: Diagnosis present

## 2017-07-20 LAB — URINALYSIS, COMPLETE (UACMP) WITH MICROSCOPIC
Bilirubin Urine: NEGATIVE
Glucose, UA: NEGATIVE mg/dL
Hgb urine dipstick: NEGATIVE
KETONES UR: NEGATIVE mg/dL
LEUKOCYTES UA: NEGATIVE
Nitrite: NEGATIVE
PROTEIN: NEGATIVE mg/dL
RBC / HPF: NONE SEEN RBC/hpf (ref 0–5)
Specific Gravity, Urine: 1.009 (ref 1.005–1.030)
pH: 7 (ref 5.0–8.0)

## 2017-07-20 LAB — COMPREHENSIVE METABOLIC PANEL
ALBUMIN: 4 g/dL (ref 3.5–5.0)
ALK PHOS: 57 U/L (ref 38–126)
ALT: 12 U/L — AB (ref 14–54)
ANION GAP: 8 (ref 5–15)
AST: 22 U/L (ref 15–41)
BUN: 27 mg/dL — ABNORMAL HIGH (ref 6–20)
CALCIUM: 9.7 mg/dL (ref 8.9–10.3)
CHLORIDE: 93 mmol/L — AB (ref 101–111)
CO2: 28 mmol/L (ref 22–32)
CREATININE: 1 mg/dL (ref 0.44–1.00)
GFR calc non Af Amer: 49 mL/min — ABNORMAL LOW (ref >60)
GFR, EST AFRICAN AMERICAN: 57 mL/min — AB (ref >60)
GLUCOSE: 134 mg/dL — AB (ref 65–99)
Potassium: 4.1 mmol/L (ref 3.5–5.1)
Sodium: 129 mmol/L — ABNORMAL LOW (ref 135–145)
Total Bilirubin: 0.5 mg/dL (ref 0.3–1.2)
Total Protein: 7.5 g/dL (ref 6.5–8.1)

## 2017-07-20 LAB — CBC
HCT: 35.4 % (ref 35.0–47.0)
HEMOGLOBIN: 12.2 g/dL (ref 12.0–16.0)
MCH: 31.3 pg (ref 26.0–34.0)
MCHC: 34.5 g/dL (ref 32.0–36.0)
MCV: 90.6 fL (ref 80.0–100.0)
Platelets: 242 10*3/uL (ref 150–440)
RBC: 3.9 MIL/uL (ref 3.80–5.20)
RDW: 13.2 % (ref 11.5–14.5)
WBC: 6.8 10*3/uL (ref 3.6–11.0)

## 2017-07-20 LAB — LIPASE, BLOOD: Lipase: 22 U/L (ref 11–51)

## 2017-07-20 MED ORDER — PREDNISONE 10 MG (21) PO TBPK: ORAL_TABLET | Freq: Every day | ORAL | 0 refills | Status: DC

## 2017-07-20 MED ORDER — OXYCODONE-ACETAMINOPHEN 5-325 MG PO TABS
1.0000 | ORAL_TABLET | Freq: Once | ORAL | Status: AC
  Administered 2017-07-20: 1 via ORAL
  Filled 2017-07-20: qty 1

## 2017-07-20 MED ORDER — OXYCODONE HCL 5 MG PO TABS: 5.0000 mg | ORAL_TABLET | Freq: Three times a day (TID) | ORAL | 0 refills | Status: DC | PRN

## 2017-07-20 MED ORDER — OMEPRAZOLE 20 MG PO CPDR: 20.0000 mg | DELAYED_RELEASE_CAPSULE | Freq: Two times a day (BID) | ORAL | 1 refills | Status: DC

## 2017-07-20 MED ORDER — ONDANSETRON 4 MG PO TBDP
4.0000 mg | ORAL_TABLET | Freq: Once | ORAL | Status: AC
  Administered 2017-07-20: 4 mg via ORAL
  Filled 2017-07-20: qty 1

## 2017-07-20 NOTE — ED Notes (Signed)
Pt returned from CT °

## 2017-07-20 NOTE — ED Provider Notes (Signed)
[facility] Emergency Department Provider Note       Time seen: ----------------------------------------- 4:23 PM on 07/20/2017 -----------------------------------------     I have reviewed the triage vital signs and the nursing notes.   HISTORY   Chief Complaint Back Pain    HPI Theresa Harris is a 83 y.o. female who presents to the ED for right-sided back pain for the last 6 days. Patient states she's having severe muscle spasms. Patient states she had a massage therapist at her house yesterday and whenever massage therapist touched her right side she had right-sided muscle spasms. She is having pain that radiates down her right leg for the last week. She denies fevers, chills or other complaints.   Past Medical History:  Diagnosis Date  . Anxiety   . Atherosclerosis of abdominal aorta ([place])   . Atrial fibrillation ([place])   . CHF (congestive heart failure) ([place])   . Chronic diastolic heart failure ([place])   . Hemorrhagic stroke ([place])   . HTN (hypertension)   . Spinal stenosis     Patient Active Problem List   Diagnosis Date Noted  . Osteoarthritis of left knee 12/21/2015  . Nontraumatic subcortical hemorrhage of right cerebral hemisphere ([place])   . Chronic diastolic congestive heart failure ([place]) 10/28/2015  . Essential hypertension 10/28/2015  . Ataxia   . Sensation alteration, late effect of cerebrovascular disease   . Aneurysm, cerebral, nonruptured 10/26/2015  . Meningioma ([place]) 10/26/2015  . ICH (intracerebral hemorrhage) ([place]) 10/22/2015    Past Surgical History:  Procedure Laterality Date  . ABDOMINAL HYSTERECTOMY    . CARPAL TUNNEL RELEASE    . CHOLECYSTECTOMY    . HIP SURGERY    . KNEE SURGERY     right    Allergies Morphine and related  Social History Social History  Substance Use Topics  . Smoking status: Former Research scientist (life sciences)  . Smokeless tobacco: Never Used  . Alcohol use No    Review of Systems Constitutional:  Negative for fever. Eyes: Negative for vision changes ENT:  Negative for congestion, sore throat Cardiovascular: Negative for chest pain. Respiratory: Negative for shortness of breath. Gastrointestinal: positive for flank pain Genitourinary: Negative for dysuria. Musculoskeletal: Negative for back pain. Skin: Negative for rash. Neurological: Negative for headaches, focal weakness or numbness.  All systems negative/normal/unremarkable except as stated in the HPI  ____________________________________________   PHYSICAL EXAM:  VITAL SIGNS: ED Triage Vitals  Enc Vitals Group     BP 07/20/17 1318 (!) 175/38     Pulse Rate 07/20/17 1318 (!) 48     Resp 07/20/17 1318 16     Temp 07/20/17 1318 98.1 F (36.7 C)     Temp Source 07/20/17 1318 Oral     SpO2 07/20/17 1318 98 %     Weight 07/20/17 1316 130 lb (59 kg)     Height 07/20/17 1316 5' (1.524 m)     Head Circumference --      Peak Flow --      Pain Score --      Pain Loc --      Pain Edu? --      Excl. in [place]? --     Constitutional: Alert and oriented. Well appearing and in no distress. Eyes: Conjunctivae are normal. Normal extraocular movements. ENT   Head: Normocephalic and atraumatic.   Nose: No congestion/rhinnorhea.   Mouth/Throat: Mucous membranes are moist.   Neck: No stridor. Cardiovascular: Normal rate, regular rhythm. No murmurs, rubs, or gallops.  Respiratory: Normal respiratory effort without tachypnea nor retractions. Breath sounds are clear and equal bilaterally. No wheezes/rales/rhonchi. Gastrointestinal: Soft and nontender. Normal bowel sounds Musculoskeletal: Nontender with normal range of motion in extremities. No lower extremity tenderness nor edema.tenderness to palpation to the right lower chest wall Neurologic:  Normal speech and language. No gross focal neurologic deficits are appreciated.  Skin:  Skin is warm, dry and intact. No rash noted. Psychiatric: Mood and affect are normal. Speech  and behavior are normal.  ____________________________________________  ED COURSE:  Pertinent labs & imaging results that were available during my care of the patient were reviewed by me and considered in my medical decision making (see chart for details). Patient presents for flank pain, we will assess with labs and imaging as indicated.   Procedures ____________________________________________   LABS (pertinent positives/negatives)  Labs Reviewed  COMPREHENSIVE METABOLIC PANEL - Abnormal; Notable for the following:       Result Value   Sodium 129 (*)    Chloride 93 (*)    Glucose, Bld 134 (*)    BUN 27 (*)    ALT 12 (*)    GFR calc non Af Amer 49 (*)    GFR calc Af Amer 57 (*)    All other components within normal limits  URINALYSIS, COMPLETE (UACMP) WITH MICROSCOPIC - Abnormal; Notable for the following:    Color, Urine YELLOW (*)    APPearance CLEAR (*)    Bacteria, UA RARE (*)    Squamous Epithelial / LPF 0-5 (*)    All other components within normal limits  CBC  LIPASE, BLOOD    RADIOLOGY Images were viewed by me  CT renal protocol IMPRESSION: 1. Apparent subtle edema/ inflammation in the region of the duodenum and pancreatic head. Groove pancreatitis would be a consideration based on imaging. 2. Status post left total hip replacement with displacement of the acetabular component multiple fractured fixation screws associated with the acetabular cup are evident and may be related to previous arthroplasty as it appears that the the patient has had revision surgery. One of the inferior acetabular screws on the current study shows evidence of loosening. 3. Subtle nodularity of liver contour raises the question of cirrhosis. 4. Trace right pleural effusion. 5. Small hiatal hernia. 6. Aortic Atherosclerois (ICD10-170.0) ____________________________________________  FINAL ASSESSMENT AND PLAN  Flank pain  Plan: Patient's labs and imaging were dictated above.  Patient had presented for flank pain of uncertain etiology. Her lipase is not elevated so I will treat her for possible duodenitis. She does have chronic left hip pain and is had numerous revisions to the left hip. She'll be discharged with pain medicine and close outpatient follow-up.   Earleen Newport, MD   Note: This note was generated in part or whole with voice recognition software. Voice recognition is usually quite accurate but there are transcription errors that can and very often do occur. I apologize for any typographical errors that were not detected and corrected.     Earleen Newport, MD 07/20/17 [phone]

## 2017-07-20 NOTE — ED Triage Notes (Signed)
C/O right sided back pain x 6 days.  Patient states she is having "severe muscle spasms".  Pain worse with coughing and movement.  Patient has a regular message therapist who gave patient a message yesterday.  Patient states pain is radiating down right leg x 1 week.

## 2017-07-20 NOTE — ED Notes (Signed)
Pt transported to CT ?

## 2017-08-15 DIAGNOSIS — I480 Paroxysmal atrial fibrillation: Secondary | ICD-10-CM | POA: Diagnosis not present

## 2017-08-15 DIAGNOSIS — I4891 Unspecified atrial fibrillation: Secondary | ICD-10-CM | POA: Diagnosis not present

## 2017-08-15 DIAGNOSIS — I5032 Chronic diastolic (congestive) heart failure: Secondary | ICD-10-CM | POA: Diagnosis not present

## 2017-08-15 DIAGNOSIS — I10 Essential (primary) hypertension: Secondary | ICD-10-CM | POA: Diagnosis not present

## 2017-08-15 DIAGNOSIS — R05 Cough: Secondary | ICD-10-CM | POA: Diagnosis not present

## 2017-08-15 DIAGNOSIS — F419 Anxiety disorder, unspecified: Secondary | ICD-10-CM | POA: Diagnosis not present

## 2017-08-15 DIAGNOSIS — I4510 Unspecified right bundle-branch block: Secondary | ICD-10-CM | POA: Diagnosis not present

## 2017-08-16 ENCOUNTER — Other Ambulatory Visit: Payer: Self-pay | Admitting: Nurse Practitioner

## 2017-08-16 DIAGNOSIS — M5441 Lumbago with sciatica, right side: Secondary | ICD-10-CM | POA: Diagnosis not present

## 2017-08-16 DIAGNOSIS — G8929 Other chronic pain: Secondary | ICD-10-CM | POA: Diagnosis not present

## 2017-08-16 DIAGNOSIS — R1011 Right upper quadrant pain: Secondary | ICD-10-CM | POA: Diagnosis not present

## 2017-08-16 DIAGNOSIS — R933 Abnormal findings on diagnostic imaging of other parts of digestive tract: Secondary | ICD-10-CM

## 2017-08-20 ENCOUNTER — Ambulatory Visit: Payer: PPO

## 2017-08-20 ENCOUNTER — Ambulatory Visit
Admission: RE | Admit: 2017-08-20 | Discharge: 2017-08-20 | Disposition: A | Discharge status: Home / Self Care | Payer: PPO | Source: Ambulatory Visit | Attending: Nurse Practitioner | Admitting: Nurse Practitioner

## 2017-08-20 DIAGNOSIS — R932 Abnormal findings on diagnostic imaging of liver and biliary tract: Secondary | ICD-10-CM | POA: Diagnosis not present

## 2017-08-20 DIAGNOSIS — R933 Abnormal findings on diagnostic imaging of other parts of digestive tract: Secondary | ICD-10-CM | POA: Insufficient documentation

## 2017-08-20 DIAGNOSIS — M5441 Lumbago with sciatica, right side: Secondary | ICD-10-CM | POA: Diagnosis not present

## 2017-08-20 DIAGNOSIS — M25552 Pain in left hip: Secondary | ICD-10-CM | POA: Diagnosis not present

## 2017-08-30 DIAGNOSIS — R0781 Pleurodynia: Secondary | ICD-10-CM | POA: Diagnosis not present

## 2017-08-31 DIAGNOSIS — T84018A Broken internal joint prosthesis, other site, initial encounter: Secondary | ICD-10-CM | POA: Diagnosis not present

## 2017-08-31 DIAGNOSIS — Z96649 Presence of unspecified artificial hip joint: Secondary | ICD-10-CM | POA: Diagnosis not present

## 2017-08-31 DIAGNOSIS — M5441 Lumbago with sciatica, right side: Secondary | ICD-10-CM | POA: Diagnosis not present

## 2017-08-31 DIAGNOSIS — R933 Abnormal findings on diagnostic imaging of other parts of digestive tract: Secondary | ICD-10-CM | POA: Diagnosis not present

## 2017-09-10 DIAGNOSIS — E871 Hypo-osmolality and hyponatremia: Secondary | ICD-10-CM | POA: Diagnosis not present

## 2017-09-21 DIAGNOSIS — Z96649 Presence of unspecified artificial hip joint: Secondary | ICD-10-CM | POA: Diagnosis not present

## 2017-09-21 DIAGNOSIS — M25552 Pain in left hip: Secondary | ICD-10-CM | POA: Diagnosis not present

## 2017-09-21 DIAGNOSIS — T84018A Broken internal joint prosthesis, other site, initial encounter: Secondary | ICD-10-CM | POA: Diagnosis not present

## 2017-09-24 DIAGNOSIS — E871 Hypo-osmolality and hyponatremia: Secondary | ICD-10-CM | POA: Diagnosis not present

## 2017-09-24 DIAGNOSIS — I480 Paroxysmal atrial fibrillation: Secondary | ICD-10-CM | POA: Diagnosis not present

## 2017-09-24 DIAGNOSIS — R05 Cough: Secondary | ICD-10-CM | POA: Diagnosis not present

## 2017-09-24 DIAGNOSIS — R0781 Pleurodynia: Secondary | ICD-10-CM | POA: Diagnosis not present

## 2017-09-24 DIAGNOSIS — M5441 Lumbago with sciatica, right side: Secondary | ICD-10-CM | POA: Diagnosis not present

## 2017-09-24 DIAGNOSIS — M546 Pain in thoracic spine: Secondary | ICD-10-CM | POA: Diagnosis not present

## 2017-10-01 ENCOUNTER — Other Ambulatory Visit: Payer: Self-pay | Admitting: Physician Assistant

## 2017-10-01 DIAGNOSIS — T84018S Broken internal joint prosthesis, other site, sequela: Secondary | ICD-10-CM | POA: Diagnosis not present

## 2017-10-01 DIAGNOSIS — R221 Localized swelling, mass and lump, neck: Secondary | ICD-10-CM | POA: Diagnosis not present

## 2017-10-01 DIAGNOSIS — Z96649 Presence of unspecified artificial hip joint: Secondary | ICD-10-CM | POA: Diagnosis not present

## 2017-10-01 DIAGNOSIS — I480 Paroxysmal atrial fibrillation: Secondary | ICD-10-CM | POA: Diagnosis not present

## 2017-10-01 DIAGNOSIS — R110 Nausea: Secondary | ICD-10-CM | POA: Diagnosis not present

## 2017-10-01 DIAGNOSIS — I10 Essential (primary) hypertension: Secondary | ICD-10-CM | POA: Diagnosis not present

## 2017-10-01 DIAGNOSIS — M5416 Radiculopathy, lumbar region: Secondary | ICD-10-CM | POA: Diagnosis not present

## 2017-10-02 DIAGNOSIS — R627 Adult failure to thrive: Secondary | ICD-10-CM | POA: Diagnosis not present

## 2017-10-02 DIAGNOSIS — R634 Abnormal weight loss: Secondary | ICD-10-CM | POA: Diagnosis not present

## 2017-10-02 DIAGNOSIS — R1011 Right upper quadrant pain: Secondary | ICD-10-CM | POA: Diagnosis not present

## 2017-10-02 DIAGNOSIS — R300 Dysuria: Secondary | ICD-10-CM | POA: Diagnosis not present

## 2017-10-02 DIAGNOSIS — K7460 Unspecified cirrhosis of liver: Secondary | ICD-10-CM | POA: Diagnosis not present

## 2017-10-02 DIAGNOSIS — R630 Anorexia: Secondary | ICD-10-CM | POA: Diagnosis not present

## 2017-10-03 ENCOUNTER — Ambulatory Visit
Admission: RE | Admit: 2017-10-03 | Discharge: 2017-10-03 | Disposition: A | Discharge status: Home / Self Care | Payer: PPO | Source: Ambulatory Visit | Attending: Physician Assistant | Admitting: Physician Assistant

## 2017-10-03 DIAGNOSIS — R918 Other nonspecific abnormal finding of lung field: Secondary | ICD-10-CM | POA: Insufficient documentation

## 2017-10-03 DIAGNOSIS — C787 Secondary malignant neoplasm of liver and intrahepatic bile duct: Secondary | ICD-10-CM | POA: Insufficient documentation

## 2017-10-03 DIAGNOSIS — R221 Localized swelling, mass and lump, neck: Secondary | ICD-10-CM | POA: Diagnosis present

## 2017-10-03 MED ORDER — IOPAMIDOL (ISOVUE-300) INJECTION 61%
75.0000 mL | Freq: Once | INTRAVENOUS | Status: AC | PRN
  Administered 2017-10-03: 75 mL via INTRAVENOUS

## 2017-10-04 ENCOUNTER — Encounter: Payer: Self-pay | Admitting: *Deleted

## 2017-10-04 NOTE — Progress Notes (Addendum)
  Oncology Nurse Navigator Documentation  Navigator Location: [facility] (10/04/17 1600) Referral date to RadOnc/MedOnc: 10/04/17 (10/04/17 1600) )Navigator Encounter Type: Introductory phone call (10/04/17 1600)   Abnormal Finding Date: 10/03/17 (10/04/17 1600)                     Barriers/Navigation Needs: Coordination of Care (10/04/17 1600)   Interventions: Coordination of Care (10/04/17 1600)   Coordination of Care: Appts (10/04/17 1600)        Acuity: Level 2 (10/04/17 1600)   Acuity Level 2: Initial guidance, education and coordination as needed;Educational needs;Assistance expediting appointments (10/04/17 1600)  referral received from Dr. Harlan Stains office for lung mass. Pt scheduled to see Dr. Grayland Ormond in [place] on Fri 10/05/2017 at 11am. Attempted to call pt's daughter, [name], with no answer. Message left with [name] with appt details and to call back to confirm appt. Awaiting call back.  Pt's daughter called back and confirmed appt with Dr. Grayland Ormond on 11/9 at [facility] in [place].    Time Spent with Patient: 30 (10/04/17 1600)

## 2017-10-05 ENCOUNTER — Encounter: Payer: Self-pay | Admitting: *Deleted

## 2017-10-05 ENCOUNTER — Other Ambulatory Visit: Payer: Self-pay

## 2017-10-05 ENCOUNTER — Inpatient Hospital Stay: Payer: PPO | Attending: Oncology | Admitting: Oncology

## 2017-10-05 ENCOUNTER — Encounter: Payer: Self-pay | Admitting: Oncology

## 2017-10-05 VITALS — BP 120/70 | HR 64 | Temp 97.5°F | Resp 20 | Wt 115.0 lb

## 2017-10-05 DIAGNOSIS — I714 Abdominal aortic aneurysm, without rupture: Secondary | ICD-10-CM | POA: Diagnosis not present

## 2017-10-05 DIAGNOSIS — F419 Anxiety disorder, unspecified: Secondary | ICD-10-CM | POA: Insufficient documentation

## 2017-10-05 DIAGNOSIS — R531 Weakness: Secondary | ICD-10-CM | POA: Insufficient documentation

## 2017-10-05 DIAGNOSIS — M899 Disorder of bone, unspecified: Secondary | ICD-10-CM | POA: Diagnosis not present

## 2017-10-05 DIAGNOSIS — E871 Hypo-osmolality and hyponatremia: Secondary | ICD-10-CM | POA: Diagnosis not present

## 2017-10-05 DIAGNOSIS — R5383 Other fatigue: Secondary | ICD-10-CM | POA: Diagnosis not present

## 2017-10-05 DIAGNOSIS — R05 Cough: Secondary | ICD-10-CM | POA: Insufficient documentation

## 2017-10-05 DIAGNOSIS — R634 Abnormal weight loss: Secondary | ICD-10-CM | POA: Insufficient documentation

## 2017-10-05 DIAGNOSIS — M4800 Spinal stenosis, site unspecified: Secondary | ICD-10-CM | POA: Diagnosis not present

## 2017-10-05 DIAGNOSIS — I509 Heart failure, unspecified: Secondary | ICD-10-CM | POA: Insufficient documentation

## 2017-10-05 DIAGNOSIS — Z79899 Other long term (current) drug therapy: Secondary | ICD-10-CM | POA: Insufficient documentation

## 2017-10-05 DIAGNOSIS — Z87891 Personal history of nicotine dependence: Secondary | ICD-10-CM | POA: Insufficient documentation

## 2017-10-05 DIAGNOSIS — I10 Essential (primary) hypertension: Secondary | ICD-10-CM

## 2017-10-05 DIAGNOSIS — M25512 Pain in left shoulder: Secondary | ICD-10-CM | POA: Insufficient documentation

## 2017-10-05 DIAGNOSIS — I5032 Chronic diastolic (congestive) heart failure: Secondary | ICD-10-CM

## 2017-10-05 DIAGNOSIS — K769 Liver disease, unspecified: Secondary | ICD-10-CM | POA: Diagnosis not present

## 2017-10-05 DIAGNOSIS — R918 Other nonspecific abnormal finding of lung field: Secondary | ICD-10-CM | POA: Diagnosis not present

## 2017-10-05 NOTE — Progress Notes (Signed)
Patient here today for initial evaluation regarding lung mass. Patient reports cough, progressive pain and weight loss.

## 2017-10-05 NOTE — Progress Notes (Signed)
[place]  [phone] [phone]) [phone]  ID: CLEDA IMEL OB: 27-Mar-1927  MR#: [id number]  PNT#:[id number]  Patient Care Team: Kirk Ruths, MD as PCP - General (Internal Medicine)  CHIEF COMPLAINT: Right hilar mass with liver and bone lesions suspicious for metastatic disease.  INTERVAL HISTORY: Patient is an 83 year old female is complaining of abdominal pain in August 2018.  Subsequent CT scan follow-up ultrasound did not reveal distinct etiology.  Patient reports her performance status has been slowly declining since this time.  She has noted to have increasing cough, left shoulder pain, as well as hyponatremia.  Subsequent CT scan of the chest revealed not only a right hilar mass but 2 new liver lesions as well as possible bony metastasis.  She has increased weakness and fatigue, but otherwise feels well.  She has no neurologic complaints.  She denies any recent fevers.  She denies any chest pain or shortness of breath.  She has no nausea, vomiting, constipation, or diarrhea.  She has no urinary complaints.  The patient offers no further specific complaints today.  REVIEW OF SYSTEMS:   Review of Systems  Constitutional: Positive for malaise/fatigue and weight loss. Negative for fever.  Respiratory: Positive for cough. Negative for hemoptysis and shortness of breath.   Cardiovascular: Negative.  Negative for chest pain and leg swelling.  Gastrointestinal: Negative.  Negative for abdominal pain, blood in stool and melena.  Genitourinary: Negative.   Musculoskeletal: Positive for back pain and neck pain.  Skin: Negative.  Negative for rash.  Neurological: Positive for weakness.  Psychiatric/Behavioral: The patient is not nervous/anxious.     As per HPI. Otherwise, a complete review of systems is negative.  PAST MEDICAL HISTORY: Past Medical History:  Diagnosis Date  . Anxiety   . Atherosclerosis of abdominal aorta ([place])   . Atrial  fibrillation ([place])   . CHF (congestive heart failure) ([place])   . Chronic diastolic heart failure ([place])   . Hemorrhagic stroke ([place])   . HTN (hypertension)   . Spinal stenosis     PAST SURGICAL HISTORY: Past Surgical History:  Procedure Laterality Date  . ABDOMINAL HYSTERECTOMY    . CARPAL TUNNEL RELEASE    . CHOLECYSTECTOMY    . HIP SURGERY    . KNEE SURGERY     right    FAMILY HISTORY: History reviewed. No pertinent family history.  ADVANCED DIRECTIVES (Y/N):  N  HEALTH MAINTENANCE: Social History   Tobacco Use  . Smoking status: Former Research scientist (life sciences)  . Smokeless tobacco: Never Used  Substance Use Topics  . Alcohol use: No  . Drug use: No     Colonoscopy:  PAP:  Bone density:  Lipid panel:  Allergies  Allergen Reactions  . Morphine And Related Nausea And Vomiting    Current Outpatient Medications  Medication Sig Dispense Refill  . diphenhydramine-acetaminophen (TYLENOL PM) 25-500 MG TABS tablet Take 1 tablet at bedtime as needed by mouth.    . estradiol (ESTRACE) 1 MG tablet Take 0.5 tablets (0.5 mg total) by mouth daily. 30 tablet 1  . losartan (COZAAR) 100 MG tablet Take 100 mg daily by mouth.    . magnesium oxide (MAG-OX) 400 MG tablet Take 1 tablet (400 mg total) by mouth daily. 30 tablet 1  . metoprolol tartrate (LOPRESSOR) 25 MG tablet Take 25 mg 2 (two) times daily by mouth.    Marland Kitchen omeprazole (PRILOSEC) 20 MG capsule Take 1 capsule (20 mg total) by mouth 2 (two) times daily. [place]  capsule 1  . oxazepam (SERAX) 15 MG capsule Take 1 capsule by mouth 3 (three) times daily as needed.    . senna (SENOKOT) 8.6 MG TABS tablet Take 1 tablet by mouth.    Marland Kitchen tiZANidine (ZANAFLEX) 2 MG tablet 1 mg bedtime and every 8 hours as needed 60 tablet 0  . traMADol (ULTRAM) 50 MG tablet Take every 6 (six) hours as needed by mouth.     No current facility-administered medications for this visit.     OBJECTIVE: Vitals:   10/05/17 1230  BP: 120/70  Pulse: 64  Resp: 20  Temp: (!)  97.5 F (36.4 C)     Body mass index is 22.46 kg/m.    ECOG FS:1 - Symptomatic but completely ambulatory  General: Well-developed, well-nourished, no acute distress. Eyes: Pink conjunctiva, anicteric sclera. HEENT: Normocephalic, moist mucous membranes, clear oropharnyx. Lungs: Clear to auscultation bilaterally. Heart: Regular rate and rhythm. No rubs, murmurs, or gallops. Abdomen: Soft, nontender, nondistended. No organomegaly noted, normoactive bowel sounds. Musculoskeletal: No edema, cyanosis, or clubbing. Neuro: Alert, answering all questions appropriately. Cranial nerves grossly intact. Skin: Easily palpable subcutaneous nodule near left scapula Psych: Normal affect. Lymphatics: Left supraclavicular adenopathy.   LAB RESULTS:  Lab Results  Component Value Date   NA 129 (L) 07/20/2017   K 4.1 07/20/2017   CL 93 (L) 07/20/2017   CO2 28 07/20/2017   GLUCOSE 134 (H) 07/20/2017   BUN 27 (H) 07/20/2017   CREATININE 1.00 07/20/2017   CALCIUM 9.7 07/20/2017   PROT 7.5 07/20/2017   ALBUMIN 4.0 07/20/2017   AST 22 07/20/2017   ALT 12 (L) 07/20/2017   ALKPHOS 57 07/20/2017   BILITOT 0.5 07/20/2017   GFRNONAA 49 (L) 07/20/2017   GFRAA 57 (L) 07/20/2017    Lab Results  Component Value Date   WBC 6.8 07/20/2017   NEUTROABS 5.1 05/31/2016   HGB 12.2 07/20/2017   HCT 35.4 07/20/2017   MCV 90.6 07/20/2017   PLT 242 07/20/2017     STUDIES: Ct Chest W Contrast  Result Date: 10/03/2017 CLINICAL DATA:  Chest pain.  Short breath.  Weight loss. EXAM: CT CHEST WITH CONTRAST TECHNIQUE: Multidetector CT imaging of the chest was performed during intravenous contrast administration. CONTRAST:  45mL ISOVUE-300 IOPAMIDOL (ISOVUE-300) INJECTION 61% COMPARISON:  Abdominal CT 07/20/2017 FINDINGS: Cardiovascular: No significant vascular findings. Normal heart size. No pericardial effusion. Mediastinum/Nodes: RIGHT hilar mass extends inot the right mediastinum measuring 3.7 x 4.2 cm. This  mass surrounds the RIGHT upper lobe bronchus. Mass abuts the posterior wall of the superior vena cava. Lungs/Pleura: Several peripheral nodules in the RIGHT upper lobe and LEFT upper lobe. Small RIGHT lower lobe nodule measures 5 mm (image 78, series 3). Small RIGHT middle lobe nodule measures 5 mm (image 60, series 3). Scattered subcentimeter LEFT lung nodules. Upper Abdomen: Several large round hypoenhancing lesions in the LEFT and RIGHT hepatic lobes consistent with metastasis. Lesions are new from noncontrast CT 07/20/2017. Example lesion includes 4.3 cm lesion in the subcapsular RIGHT hepatic lobe. Adjacent 2.7 cm lesion RIGHT hepatic lobe. In LEFT lateral hepatic lobe a 1.0 cm and 1.4 cm lesion (images 137 and 109, series 2 ) Musculoskeletal: Severe degenerate changes shoulders. Sclerotic within the manubrium on lateral projection (image 86, series 6) this smudgy with potential cortical involvement. Loss of definition of the posterior wall vertebral body at T1 (image 86, series 6) is concerning). IMPRESSION: 1. RIGHT hilar mass consistent with bronchogenic carcinoma. Consider small cell carcinoma. 2. Postobstructive  nodularity in the RIGHT upper lobe. 3. Scattered small bilateral pulmonary nodules are indeterminate. 4. Hypermetabolic bilobar HEPATIC METASTASIS. 5. Concern for skeletal metastasis in the sternum and T1 vertebral body. 6. FDG PET scan would be beneficial for biopsy planning and staging. These results will be called to the ordering clinician or representative by the Radiologist Assistant, and communication documented in the PACS or zVision Dashboard. Electronically Signed   By: Suzy Bouchard M.D.   On: 10/03/2017 17:04    ASSESSMENT: Right hilar mass with liver and bone lesions suspicious for metastatic disease.  PLAN:    1.  Right hilar mass: CT scan results reviewed independently and reported as above highly suspicious for underlying malignancy.  Given the fact that patient had a  negative CT scan of her abdomen in August and now has 2 distinct liver lesions, this is highly suspicious for small cell lung cancer.  Will get a PET scan to further evaluate.  Patient will ultimately require a biopsy either of the left clavicular node, left scapular subcutaneous nodule, or possibly an ultrasound-guided liver biopsy.  We will also order a brain MRI for completeness.  Patient will return to clinic in 7-10 days to discuss the results and possible treatment planning if desired. 2.  Hyponatremia: Possibly secondary to underlying malignancy, monitor.  Approximately 60 minutes was spent in discussion of which greater than 50% was consultation.    Or patient expressed understanding and was in agreement with this plan. She also understands that She can call clinic at any time with any questions, concerns, or complaints.   Cancer Staging No matching staging information was found for the patient.  Lloyd Huger, MD   10/05/2017 2:22 PM

## 2017-10-05 NOTE — Progress Notes (Signed)
  Oncology Nurse Navigator Documentation  Navigator Location: [facility] (10/05/17 1300)   )Navigator Encounter Type: Initial MedOnc (10/05/17 1300)                       Treatment Phase: Abnormal Scans (10/05/17 1300) Barriers/Navigation Needs: Coordination of Care (10/05/17 1300)   Interventions: Coordination of Care (10/05/17 1300)   Coordination of Care: Appts;Radiology (10/05/17 1300)         met with patient during initial med-onc consultation with Dr. Grayland Ormond. All questions answered at the time of visit. Upcoming appts reviewed with the patient and daughter. Instructions reviewed with daughter for PET scan. Informed daughter that will schedule pt for biopsy ASAP once PET results are available. Contact info given and instructed to call with any further questions or needs. Pt and daughter verbalized understanding.         Time Spent with Patient: 60 (10/05/17 1300)

## 2017-10-10 ENCOUNTER — Telehealth: Payer: Self-pay | Admitting: *Deleted

## 2017-10-10 ENCOUNTER — Encounter
Admission: RE | Admit: 2017-10-10 | Discharge: 2017-10-10 | Disposition: A | Discharge status: Home / Self Care | Payer: PPO | Source: Ambulatory Visit | Attending: Oncology | Admitting: Oncology

## 2017-10-10 DIAGNOSIS — R918 Other nonspecific abnormal finding of lung field: Secondary | ICD-10-CM | POA: Diagnosis not present

## 2017-10-10 DIAGNOSIS — C787 Secondary malignant neoplasm of liver and intrahepatic bile duct: Secondary | ICD-10-CM | POA: Diagnosis not present

## 2017-10-10 LAB — GLUCOSE, CAPILLARY: GLUCOSE-CAPILLARY: 90 mg/dL (ref 65–99)

## 2017-10-10 MED ORDER — FLUDEOXYGLUCOSE F - 18 (FDG) INJECTION
12.0000 | Freq: Once | INTRAVENOUS | Status: AC | PRN
  Administered 2017-10-10: 12.06 via INTRAVENOUS

## 2017-10-10 NOTE — Progress Notes (Signed)
[place]  [phone] Fax:(336) [phone]  ID: Theresa Harris OB: November 30, 1926  MR#: [id number]  GYI#:[id number]  Patient Care Team: Kirk Ruths, MD as PCP - General (Internal Medicine) Telford Nab, RN as Registered Nurse  CHIEF COMPLAINT: Right hilar mass with liver and bone lesions suspicious for metastatic disease.  INTERVAL HISTORY: Patient returns to clinic today for further evaluation and discussion of her imaging results.  She continues to have a declining performance status.  She continues to have a chronic cough.  She also has left shoulder pain as well as right flank pain that becomes worse with coughing.  Her daughter reports that she appears more confused.  She has no neurologic complaints.  She denies any recent fevers.  She denies any chest pain or shortness of breath.  She has no nausea, vomiting, constipation, or diarrhea.  She has no urinary complaints.  The patient offers no further specific complaints today.  REVIEW OF SYSTEMS:   Review of Systems  Constitutional: Positive for malaise/fatigue and weight loss. Negative for fever.  Respiratory: Positive for cough. Negative for hemoptysis and shortness of breath.   Cardiovascular: Negative.  Negative for chest pain and leg swelling.  Gastrointestinal: Negative.  Negative for abdominal pain, blood in stool and melena.  Genitourinary: Positive for flank pain.  Musculoskeletal: Positive for back pain and neck pain.  Skin: Negative.  Negative for rash.  Neurological: Positive for weakness. Negative for focal weakness.  Psychiatric/Behavioral: Positive for memory loss. The patient is not nervous/anxious.     As per HPI. Otherwise, a complete review of systems is negative.  PAST MEDICAL HISTORY: Past Medical History:  Diagnosis Date  . Anxiety   . Atherosclerosis of abdominal aorta ([place])   . Atrial fibrillation ([place])   . CHF (congestive heart failure) ([place])   . Chronic diastolic  heart failure ([place])   . Hemorrhagic stroke ([place])   . HTN (hypertension)   . Spinal stenosis     PAST SURGICAL HISTORY: Past Surgical History:  Procedure Laterality Date  . ABDOMINAL HYSTERECTOMY    . CARPAL TUNNEL RELEASE    . CHOLECYSTECTOMY    . HIP SURGERY    . KNEE SURGERY     right    FAMILY HISTORY: No family history on file.  ADVANCED DIRECTIVES (Y/N):  N  HEALTH MAINTENANCE: Social History   Tobacco Use  . Smoking status: Former Research scientist (life sciences)  . Smokeless tobacco: Never Used  Substance Use Topics  . Alcohol use: No  . Drug use: No     Colonoscopy:  PAP:  Bone density:  Lipid panel:  Allergies  Allergen Reactions  . Morphine And Related Nausea And Vomiting    Current Outpatient Medications  Medication Sig Dispense Refill  . Cholecalciferol (VITAMIN D3) 2000 units TABS Take by mouth.    . diphenhydramine-acetaminophen (TYLENOL PM) 25-500 MG TABS tablet Take 1 tablet at bedtime as needed by mouth.    . estradiol (ESTRACE) 1 MG tablet Take 0.5 tablets (0.5 mg total) by mouth daily. 30 tablet 1  . losartan (COZAAR) 100 MG tablet Take 100 mg daily by mouth.    . magnesium oxide (MAG-OX) 400 MG tablet Take 1 tablet (400 mg total) by mouth daily. 30 tablet 1  . metoprolol tartrate (LOPRESSOR) 25 MG tablet Take 25 mg 2 (two) times daily by mouth.    Marland Kitchen omeprazole (PRILOSEC) 20 MG capsule Take 1 capsule (20 mg total) by mouth 2 (two) times daily. 60 capsule 1  .  ondansetron (ZOFRAN-ODT) 4 MG disintegrating tablet Take 4 mg every 8 (eight) hours as needed by mouth for nausea or vomiting.    Marland Kitchen oxazepam (SERAX) 15 MG capsule Take 1 capsule by mouth 3 (three) times daily as needed.    . senna (SENOKOT) 8.6 MG TABS tablet Take 1 tablet by mouth.    . sucralfate (CARAFATE) 1 g tablet   9  . tiZANidine (ZANAFLEX) 2 MG tablet 1 mg bedtime and every 8 hours as needed 60 tablet 0  . traMADol (ULTRAM) 50 MG tablet Take every 6 (six) hours as needed by mouth.    . dexamethasone  (DECADRON) 4 MG tablet Take 1 tablet (4 mg total) daily by mouth. 30 tablet 0  . fentaNYL (DURAGESIC - DOSED MCG/HR) 12 MCG/HR Place 1 patch (12.5 mcg total) every 3 (three) days onto the skin. 10 patch 0   No current facility-administered medications for this visit.    Facility-Administered Medications Ordered in Other Visits  Medication Dose Route Frequency Provider Last Rate Last Dose  . 0.9 %  sodium chloride infusion   Intravenous Continuous Monia Sabal, PA-C        OBJECTIVE: Vitals:   10/11/17 1425  BP: 138/60  Pulse: 80  Resp: 18  Temp: (!) 97.1 F (36.2 C)     Body mass index is 21.48 kg/m.    ECOG FS:2 - Symptomatic, <50% confined to bed  General: Well-developed, well-nourished, no acute distress. Eyes: Pink conjunctiva, anicteric sclera. Lungs: Clear to auscultation bilaterally. Heart: Regular rate and rhythm. No rubs, murmurs, or gallops. Abdomen: Soft, nontender, nondistended. No organomegaly noted, normoactive bowel sounds. Musculoskeletal: No edema, cyanosis, or clubbing. Neuro: Alert, answering all questions appropriately. Cranial nerves grossly intact. Skin: Easily palpable subcutaneous nodule near left scapula Psych: Normal affect.  LAB RESULTS:  Lab Results  Component Value Date   NA 129 (L) 07/20/2017   K 4.1 07/20/2017   CL 93 (L) 07/20/2017   CO2 28 07/20/2017   GLUCOSE 134 (H) 07/20/2017   BUN 27 (H) 07/20/2017   CREATININE 1.00 07/20/2017   CALCIUM 9.7 07/20/2017   PROT 7.5 07/20/2017   ALBUMIN 4.0 07/20/2017   AST 22 07/20/2017   ALT 12 (L) 07/20/2017   ALKPHOS 57 07/20/2017   BILITOT 0.5 07/20/2017   GFRNONAA 49 (L) 07/20/2017   GFRAA 57 (L) 07/20/2017    Lab Results  Component Value Date   WBC 16.1 (H) 10/11/2017   NEUTROABS 13.2 (H) 10/11/2017   HGB 12.1 10/11/2017   HCT 37.4 10/11/2017   MCV 83.7 10/11/2017   PLT 276 10/11/2017     STUDIES: Ct Chest W Contrast  Result Date: 10/03/2017 CLINICAL DATA:  Chest pain.  Short  breath.  Weight loss. EXAM: CT CHEST WITH CONTRAST TECHNIQUE: Multidetector CT imaging of the chest was performed during intravenous contrast administration. CONTRAST:  69mL ISOVUE-300 IOPAMIDOL (ISOVUE-300) INJECTION 61% COMPARISON:  Abdominal CT 07/20/2017 FINDINGS: Cardiovascular: No significant vascular findings. Normal heart size. No pericardial effusion. Mediastinum/Nodes: RIGHT hilar mass extends inot the right mediastinum measuring 3.7 x 4.2 cm. This mass surrounds the RIGHT upper lobe bronchus. Mass abuts the posterior wall of the superior vena cava. Lungs/Pleura: Several peripheral nodules in the RIGHT upper lobe and LEFT upper lobe. Small RIGHT lower lobe nodule measures 5 mm (image 78, series 3). Small RIGHT middle lobe nodule measures 5 mm (image 60, series 3). Scattered subcentimeter LEFT lung nodules. Upper Abdomen: Several large round hypoenhancing lesions in the LEFT and RIGHT hepatic  lobes consistent with metastasis. Lesions are new from noncontrast CT 07/20/2017. Example lesion includes 4.3 cm lesion in the subcapsular RIGHT hepatic lobe. Adjacent 2.7 cm lesion RIGHT hepatic lobe. In LEFT lateral hepatic lobe a 1.0 cm and 1.4 cm lesion (images 137 and 109, series 2 ) Musculoskeletal: Severe degenerate changes shoulders. Sclerotic within the manubrium on lateral projection (image 86, series 6) this smudgy with potential cortical involvement. Loss of definition of the posterior wall vertebral body at T1 (image 86, series 6) is concerning). IMPRESSION: 1. RIGHT hilar mass consistent with bronchogenic carcinoma. Consider small cell carcinoma. 2. Postobstructive nodularity in the RIGHT upper lobe. 3. Scattered small bilateral pulmonary nodules are indeterminate. 4. Hypermetabolic bilobar HEPATIC METASTASIS. 5. Concern for skeletal metastasis in the sternum and T1 vertebral body. 6. FDG PET scan would be beneficial for biopsy planning and staging. These results will be called to the ordering clinician  or representative by the Radiologist Assistant, and communication documented in the PACS or zVision Dashboard. Electronically Signed   By: Suzy Bouchard M.D.   On: 10/03/2017 17:04   Mr Jeri Cos GQ Contrast  Result Date: 10/11/2017 CLINICAL DATA:  Lung carcinoma. Evaluation for intracranial metastatic disease. EXAM: MRI HEAD WITHOUT AND WITH CONTRAST TECHNIQUE: Multiplanar, multiecho pulse sequences of the brain and surrounding structures were obtained without and with intravenous contrast. CONTRAST:  48mL MULTIHANCE GADOBENATE DIMEGLUMINE 529 MG/ML IV SOLN COMPARISON:  Brain MRI 10/25/2015 PET CT 10/10/2017 FINDINGS: Brain: The midline structures are normal. There is no acute infarct or acute hemorrhage. Unchanged right frontal convexity meningioma measuring 1.4 cm. No signal change of the underlying brain. Bilateral old gangliothalamic lacunar infarcts. Mild periventricular white matter hyperintensity. No age-advanced or lobar predominant atrophy. Hemosiderin deposition at site of prior right thalamic hemorrhage. Vascular: Major intracranial arterial and venous sinus flow voids are preserved. Skull and upper cervical spine: The visualized skull base, calvarium, upper cervical spine and extracranial soft tissues are normal. Sinuses/Orbits: The paranasal sinuses are clear and there is no mastoid or middle ear effusion. Bilateral lens replacements. Other: None IMPRESSION: 1. No intracranial metastatic disease or acute abnormality. 2. Unchanged 14 mm right frontal convexity meningioma. No abnormality of the underlying brain. Electronically Signed   By: Ulyses Jarred M.D.   On: 10/11/2017 14:32   Nm Pet Image Initial (pi) Skull Base To Thigh  Result Date: 10/10/2017 CLINICAL DATA:  Initial treatment strategy for lung mass. EXAM: NUCLEAR MEDICINE PET SKULL BASE TO THIGH TECHNIQUE: 12.06 mCi F-18 FDG was injected intravenously. Full-ring PET imaging was performed from the skull base to thigh after the  radiotracer. CT data was obtained and used for attenuation correction and anatomic localization. FASTING BLOOD GLUCOSE:  Value: 90 mg/dl COMPARISON:  None. FINDINGS: NECK: There is a left supraclavicular lymph node measuring 8 mm within SUV max equal to 3.4. CHEST: Large right upper lobe paramediastinal mass with right paratracheal invasion is identified measuring 4.7 cm with an SUV max equal to 21.9. Small pulmonary nodules are identified within both lungs. Most of these are too small to reliably characterize by PET-CT. Index nodule within the anteromedial right upper lobe measures 8 mm within SUV max equal to 4.1. No hypermetabolic axillary lymph nodes. Mild cardiac enlargement. Aortic atherosclerosis. No pericardial effusion. ABDOMEN/PELVIS: Multifocal hypermetabolic liver metastases identified. The largest lesion is in the right lobe of liver measuring 8.4 within SUV max equal to 20.98. No abnormal uptake within the adrenal glands. The spleen is unremarkable. No kidney mass or hydronephrosis visualized. Aortic  atherosclerosis without aneurysm noted. Within the porta caval region there is a focal area of hypermetabolism within SUV max equal to 4.9. SKELETON: Multifocal hypermetabolic metastases identified within skeletal muscle. Mass within the upper posterior left chest wall measures 6.9 cm and has an SUV max equal to 4.2. Index lesion within the right gluteus musculature measures approximately 3 cm and has an SUV max equal to 8.56. Extensive multifocal hypermetabolic lytic bone metastases are identified. Of possible neurologic significance is a lesion involving to T1 vertebra which has an SUV max equal to 12.12. Other intensely hypermetabolic spinal lesions in by T7, T10 and T12. Large destructive lesion involving the left iliac bone measures approximately 5.2 cm and has an SUV max equal to 20.16. Bilateral hypermetabolic acetabular lesions are identified with underlying pathologic fractures, image number 207  and image 206 of series 3. SUV max associated with the right acetabulum equals 11.38. IMPRESSION: 1. Examination is positive for large hypermetabolic mass within the medial right upper lobe with associated mediastinal invasion. 2. Numerous small bilateral pulmonary nodules compatible with metastatic disease. 3. Extensive liver metastasis 4. Widespread hypermetabolic lytic bone metastasis. Lesions involving the thoracic spine and pelvis may be of neurologic and/or orthopedic significance. 5. Multifocal skeletal muscle metastases. Electronically Signed   By: Kerby Moors M.D.   On: 10/10/2017 14:52    ASSESSMENT: Right hilar mass with liver and bone lesions suspicious for metastatic disease.  PLAN:    1.  Right hilar mass: PET scan and MRI results reviewed independently and reported as above with widely metastatic disease in bone and liver.  Patient does not have any brain metastasis.  After lengthy discussion with the patient and her daughter they agree that treatment with chemotherapy may be more detrimental, but they wish to pursue liver biopsy on Friday, October 12, 2017 to obtain a diagnosis.  Hospice care was briefly discussed as well today.  Return to clinic in approximately 1 week to discuss her biopsy results and to make a final decision as to whether into enroll in hospice.   2.  Hyponatremia: Possibly secondary to underlying malignancy, monitor. 3.  Pain: Patient had an appointment with radiation oncology today with plans for radiation to her right hip.  She was also given a prescription for fentanyl patch today.  Approximately 30 minutes was spent in discussion of which greater than 50% was consultation.    Or patient expressed understanding and was in agreement with this plan. She also understands that She can call clinic at any time with any questions, concerns, or complaints.   Cancer Staging No matching staging information was found for the patient.  Theresa Huger, MD    10/12/2017 9:08 AM

## 2017-10-10 NOTE — Telephone Encounter (Signed)
Daughter called asking who will be giving result of her PET being done today and MRI brain scheduled for tomorrow. She has no follow up appointment with Dr Grayland Ormond scheduled. She is also asking when she is to have her biopsy done and said the MRI appointment is scheduled for the same time she has an appointment with Dr Ouida Sills tomorrow. She states she also needs Lucianne Lei transportation and does not know how to arrange that. Please advise

## 2017-10-10 NOTE — Telephone Encounter (Signed)
Pt's daughter has been notified of PET results and notified that Dr. Grayland Ormond would like to review with pt and pt's daughter tomorrow afternoon after brain MRI. Pt scheduled for 11/15 at 2pm. Pt scheduled to see Dr. Baruch Gouty as well for palliative radiation to right hip. appt with rad-onc at 11am on 11/15. Pt's daughter aware of all appts scheduled around brain MRI on 11/15.

## 2017-10-10 NOTE — Telephone Encounter (Signed)
Spoke with pt's daughter and informed her that I will be in touch this afternoon with results from the PET scan and when biopsy is scheduled. Pt's daughter stated that has cancelled appt with Dr. Ouida Sills since it would only be a follow up visit. Pt's daughter stated that has arranged [facility] transportation for brain MRI scheduled for tomorrow. States that pt has progressively been getting weaker and has difficulty transferring from the wheelchair to the car. Informed pt's daughter to expect phone call from me this afternoon regarding results from PET and biopsy planning. Pt's daughter verbalized understanding.

## 2017-10-11 ENCOUNTER — Other Ambulatory Visit: Payer: Self-pay | Admitting: Radiology

## 2017-10-11 ENCOUNTER — Other Ambulatory Visit: Payer: Self-pay

## 2017-10-11 ENCOUNTER — Inpatient Hospital Stay: Payer: PPO

## 2017-10-11 ENCOUNTER — Ambulatory Visit
Admission: RE | Admit: 2017-10-11 | Discharge: 2017-10-11 | Disposition: A | Discharge status: Home / Self Care | Payer: PPO | Source: Ambulatory Visit | Attending: Radiation Oncology | Admitting: Radiation Oncology

## 2017-10-11 ENCOUNTER — Ambulatory Visit
Admission: RE | Admit: 2017-10-11 | Discharge: 2017-10-11 | Disposition: A | Discharge status: Home / Self Care | Payer: PPO | Source: Ambulatory Visit | Attending: Oncology | Admitting: Oncology

## 2017-10-11 ENCOUNTER — Encounter: Payer: Self-pay | Admitting: Radiation Oncology

## 2017-10-11 ENCOUNTER — Inpatient Hospital Stay (HOSPITAL_BASED_OUTPATIENT_CLINIC_OR_DEPARTMENT_OTHER): Payer: PPO | Admitting: Oncology

## 2017-10-11 ENCOUNTER — Encounter: Payer: Self-pay | Admitting: *Deleted

## 2017-10-11 VITALS — BP 138/60 | HR 80 | Temp 97.1°F | Resp 18

## 2017-10-11 VITALS — BP 138/60 | HR 80 | Temp 97.1°F | Resp 18 | Wt 110.0 lb

## 2017-10-11 DIAGNOSIS — C3490 Malignant neoplasm of unspecified part of unspecified bronchus or lung: Secondary | ICD-10-CM | POA: Diagnosis not present

## 2017-10-11 DIAGNOSIS — C7931 Secondary malignant neoplasm of brain: Secondary | ICD-10-CM | POA: Diagnosis not present

## 2017-10-11 DIAGNOSIS — R918 Other nonspecific abnormal finding of lung field: Secondary | ICD-10-CM | POA: Diagnosis not present

## 2017-10-11 DIAGNOSIS — R630 Anorexia: Secondary | ICD-10-CM | POA: Insufficient documentation

## 2017-10-11 DIAGNOSIS — M25512 Pain in left shoulder: Secondary | ICD-10-CM

## 2017-10-11 DIAGNOSIS — F419 Anxiety disorder, unspecified: Secondary | ICD-10-CM | POA: Diagnosis not present

## 2017-10-11 DIAGNOSIS — M899 Disorder of bone, unspecified: Secondary | ICD-10-CM

## 2017-10-11 DIAGNOSIS — R05 Cough: Secondary | ICD-10-CM

## 2017-10-11 DIAGNOSIS — Z79899 Other long term (current) drug therapy: Secondary | ICD-10-CM

## 2017-10-11 DIAGNOSIS — I10 Essential (primary) hypertension: Secondary | ICD-10-CM | POA: Insufficient documentation

## 2017-10-11 DIAGNOSIS — C3401 Malignant neoplasm of right main bronchus: Secondary | ICD-10-CM | POA: Insufficient documentation

## 2017-10-11 DIAGNOSIS — Z87891 Personal history of nicotine dependence: Secondary | ICD-10-CM | POA: Diagnosis not present

## 2017-10-11 DIAGNOSIS — I509 Heart failure, unspecified: Secondary | ICD-10-CM | POA: Insufficient documentation

## 2017-10-11 DIAGNOSIS — I700 Atherosclerosis of aorta: Secondary | ICD-10-CM | POA: Insufficient documentation

## 2017-10-11 DIAGNOSIS — I5032 Chronic diastolic (congestive) heart failure: Secondary | ICD-10-CM | POA: Insufficient documentation

## 2017-10-11 DIAGNOSIS — C7951 Secondary malignant neoplasm of bone: Secondary | ICD-10-CM | POA: Diagnosis not present

## 2017-10-11 DIAGNOSIS — E871 Hypo-osmolality and hyponatremia: Secondary | ICD-10-CM

## 2017-10-11 DIAGNOSIS — I714 Abdominal aortic aneurysm, without rupture: Secondary | ICD-10-CM

## 2017-10-11 DIAGNOSIS — R5383 Other fatigue: Secondary | ICD-10-CM | POA: Diagnosis not present

## 2017-10-11 DIAGNOSIS — C787 Secondary malignant neoplasm of liver and intrahepatic bile duct: Secondary | ICD-10-CM | POA: Insufficient documentation

## 2017-10-11 DIAGNOSIS — M4800 Spinal stenosis, site unspecified: Secondary | ICD-10-CM

## 2017-10-11 DIAGNOSIS — K769 Liver disease, unspecified: Secondary | ICD-10-CM | POA: Diagnosis not present

## 2017-10-11 DIAGNOSIS — Z8781 Personal history of (healed) traumatic fracture: Secondary | ICD-10-CM | POA: Diagnosis not present

## 2017-10-11 DIAGNOSIS — R531 Weakness: Secondary | ICD-10-CM | POA: Diagnosis not present

## 2017-10-11 DIAGNOSIS — Z8673 Personal history of transient ischemic attack (TIA), and cerebral infarction without residual deficits: Secondary | ICD-10-CM | POA: Diagnosis not present

## 2017-10-11 DIAGNOSIS — R634 Abnormal weight loss: Secondary | ICD-10-CM

## 2017-10-11 DIAGNOSIS — I4891 Unspecified atrial fibrillation: Secondary | ICD-10-CM | POA: Insufficient documentation

## 2017-10-11 LAB — CBC WITH DIFFERENTIAL/PLATELET
BASOS PCT: 0 %
Basophils Absolute: 0 10*3/uL (ref 0–0.1)
EOS PCT: 1 %
Eosinophils Absolute: 0.1 10*3/uL (ref 0–0.7)
HEMATOCRIT: 37.4 % (ref 35.0–47.0)
Hemoglobin: 12.1 g/dL (ref 12.0–16.0)
Lymphocytes Relative: 7 %
Lymphs Abs: 1.2 10*3/uL (ref 1.0–3.6)
MCH: 27.1 pg (ref 26.0–34.0)
MCHC: 32.4 g/dL (ref 32.0–36.0)
MCV: 83.7 fL (ref 80.0–100.0)
MONO ABS: 1.6 10*3/uL — AB (ref 0.2–0.9)
MONOS PCT: 10 %
NEUTROS ABS: 13.2 10*3/uL — AB (ref 1.4–6.5)
Neutrophils Relative %: 82 %
Platelets: 276 10*3/uL (ref 150–440)
RBC: 4.46 MIL/uL (ref 3.80–5.20)
RDW: 14.3 % (ref 11.5–14.5)
WBC: 16.1 10*3/uL — ABNORMAL HIGH (ref 3.6–11.0)

## 2017-10-11 LAB — APTT: aPTT: 29 seconds (ref 24–36)

## 2017-10-11 LAB — PROTIME-INR
INR: 1.27
PROTHROMBIN TIME: 15.8 s — AB (ref 11.4–15.2)

## 2017-10-11 MED ORDER — FENTANYL 12 MCG/HR TD PT72: 12.5000 ug | MEDICATED_PATCH | TRANSDERMAL | 0 refills | Status: AC

## 2017-10-11 MED ORDER — DEXAMETHASONE 4 MG PO TABS: 4.0000 mg | ORAL_TABLET | Freq: Every day | ORAL | 0 refills | Status: AC

## 2017-10-11 MED ORDER — GADOBENATE DIMEGLUMINE 529 MG/ML IV SOLN
10.0000 mL | Freq: Once | INTRAVENOUS | Status: AC | PRN
  Administered 2017-10-11: 10 mL via INTRAVENOUS

## 2017-10-11 NOTE — Consult Note (Signed)
NEW PATIENT EVALUATION  Name: Theresa Harris  MRN: [id number]  Date:   10/11/2017     DOB: [birth year]   This 83 y.o. female patient presents to the clinic for initial evaluation of bone metastasis from presumed primary lung cancer.  REFERRING PHYSICIAN: Kirk Ruths, MD  CHIEF COMPLAINT:  Chief Complaint  Patient presents with  . Cancer    Pt is here for initial consultation of bone metastasis     DIAGNOSIS: The encounter diagnosis was Bone metastasis ([place]).   PREVIOUS INVESTIGATIONS:  PET CT scan reviewed Clinical notes reviewed  HPI: Patient is a frail elderly 83 year old female who is been having increasing abdominal pain since August 2018. This has progressed increasing cough left shoulder pain as well as hyponatremia. Recent CT scan of her chest showed a right hilar mass with also liver lesions consistent with primary bronchogenic carcinoma stage IV disease. PET CT scan was performed again showing hypermetabolic activity in a large right hilar mass. She also had small bilateral pulmonary nodules compatible with metastatic disease, extensive liver metastasis and widespread lytic bone metastasis. She did have bilateral acetabular lesions right greater than left with underlying pathologic fractures. Her greatest area of pain in his right hip I been asked to evaluate her for possible palliative radiation therapy. She is doing poorly. Her appetite is decreased. She is not ambulating. She has a scheduled MRI scan of her brain today as well as planned tomorrow for liver biopsy.   PLANNED TREATMENT REGIMEN: Single fraction of palliative radiation therapy to her right hip  PAST MEDICAL HISTORY:  has a past medical history of Anxiety, Atherosclerosis of abdominal aorta (HCC), Atrial fibrillation ([place]), CHF (congestive heart failure) (HCC), Chronic diastolic heart failure ([place]), Hemorrhagic stroke ([place]), HTN (hypertension), and Spinal stenosis.    PAST SURGICAL HISTORY:  Past  Surgical History:  Procedure Laterality Date  . ABDOMINAL HYSTERECTOMY    . CARPAL TUNNEL RELEASE    . CHOLECYSTECTOMY    . HIP SURGERY    . KNEE SURGERY     right    FAMILY HISTORY: family history is not on file.  SOCIAL HISTORY:  reports that she has quit smoking. she has never used smokeless tobacco. She reports that she does not drink alcohol or use drugs.  ALLERGIES: Morphine and related  MEDICATIONS:  Current Outpatient Medications  Medication Sig Dispense Refill  . diphenhydramine-acetaminophen (TYLENOL PM) 25-500 MG TABS tablet Take 1 tablet at bedtime as needed by mouth.    . estradiol (ESTRACE) 1 MG tablet Take 0.5 tablets (0.5 mg total) by mouth daily. 30 tablet 1  . losartan (COZAAR) 100 MG tablet Take 100 mg daily by mouth.    . magnesium oxide (MAG-OX) 400 MG tablet Take 1 tablet (400 mg total) by mouth daily. 30 tablet 1  . metoprolol tartrate (LOPRESSOR) 25 MG tablet Take 25 mg 2 (two) times daily by mouth.    Marland Kitchen omeprazole (PRILOSEC) 20 MG capsule Take 1 capsule (20 mg total) by mouth 2 (two) times daily. 60 capsule 1  . oxazepam (SERAX) 15 MG capsule Take 1 capsule by mouth 3 (three) times daily as needed.    . senna (SENOKOT) 8.6 MG TABS tablet Take 1 tablet by mouth.    . sucralfate (CARAFATE) 1 g tablet   9  . tiZANidine (ZANAFLEX) 2 MG tablet 1 mg bedtime and every 8 hours as needed 60 tablet 0  . traMADol (ULTRAM) 50 MG tablet Take every 6 (six) hours as needed  by mouth.     No current facility-administered medications for this encounter.     ECOG PERFORMANCE STATUS:  2 - Symptomatic, <50% confined to bed  REVIEW OF SYSTEMS: Patient is under significant narcotic analgesic pain control. Not ambulating based on pain. Has its slight cough no hemoptysis. Patient denies any weight loss, fatigue, weakness, fever, chills or night sweats. Patient denies any loss of vision, blurred vision. Patient denies any ringing  of the ears or hearing loss. No irregular  heartbeat. Patient denies heart murmur or history of fainting. Patient denies any chest pain or pain radiating to her upper extremities. Patient denies any shortness of breath, difficulty breathing at night, cough or hemoptysis. Patient denies any swelling in the lower legs. Patient denies any nausea vomiting, vomiting of blood, or coffee ground material in the vomitus. Patient denies any stomach pain. Patient states has had normal bowel movements no significant constipation or diarrhea. Patient denies any dysuria, hematuria or significant nocturia. Patient denies any problems walking, swelling in the joints or loss of balance. Patient denies any skin changes, loss of hair or loss of weight. Patient denies any excessive worrying or anxiety or significant depression. Patient denies any problems with insomnia. Patient denies excessive thirst, polyuria, polydipsia. Patient denies any swollen glands, patient denies easy bruising or easy bleeding. Patient denies any recent infections, allergies or URI. Patient "s visual fields have not changed significantly in recent time.    PHYSICAL EXAM: BP 138/60   Pulse 80   Temp (!) 97.1 F (36.2 C)   Resp 71  Wheelchair-bound frail elderly female in NAD. Range of motion of right X lower extremity is restricted based on pain. Motor and sensory levels appear equal and symmetric in the lower extremities. She does have a large supraclavicular mass consistent with known metastatic disease Well-developed well-nourished patient in NAD. HEENT reveals PERLA, EOMI, discs not visualized.  Oral cavity is clear. No oral mucosal lesions are identified. Neck is clear without evidence of cervical or supraclavicular adenopathy. Lungs are clear to A&P. Cardiac examination is essentially unremarkable with regular rate and rhythm without murmur rub or thrill. Abdomen is benign with no organomegaly or masses noted. Motor sensory and DTR levels are equal and symmetric in the upper and lower  extremities. Cranial nerves II through XII are grossly intact. Proprioception is intact. No peripheral adenopathy or edema is identified. No motor or sensory levels are noted. Crude visual fields are within normal range.  LABORATORY DATA: Pathology pending biopsy tomorrow    RADIOLOGY RESULTS: CT scans and PET/CT scans reviewed MRI of brain scheduled   IMPRESSION: Widespread metastatic disease including liver lung and bone in 83 year old female from presumed primary bronchogenic carcinoma most likely small cell lung cancer  PLAN: At this time I've offered single fractionated course of palliative treatment to her right hip 800 cGy 1. Other areas of pain involvement may be treated a later date. Risks and benefits of treatment including mild skin reaction fatigue were discussed with the patient. Patient also may be a candidate for hospice based on her advanced age and widespread metastatic disease. I first set up and ordered CT simulation for next week. Patient seems to comprehend my treatment plan well.  I would like to take this opportunity to thank you for allowing me to participate in the care of your patient.Armstead Peaks., MD

## 2017-10-11 NOTE — Progress Notes (Signed)
Patient is here for follow up, she mentions she has terrible leg pain at night, she is on tylenol pm and zanaflex and has tramadol. They would like to discuss a possible pain patch.

## 2017-10-12 ENCOUNTER — Ambulatory Visit
Admission: RE | Admit: 2017-10-12 | Discharge: 2017-10-12 | Disposition: A | Discharge status: Home / Self Care | Payer: PPO | Source: Ambulatory Visit | Attending: Oncology | Admitting: Oncology

## 2017-10-12 ENCOUNTER — Other Ambulatory Visit: Payer: Self-pay | Admitting: Oncology

## 2017-10-12 DIAGNOSIS — M549 Dorsalgia, unspecified: Secondary | ICD-10-CM | POA: Insufficient documentation

## 2017-10-12 DIAGNOSIS — Z87891 Personal history of nicotine dependence: Secondary | ICD-10-CM | POA: Diagnosis not present

## 2017-10-12 DIAGNOSIS — R531 Weakness: Secondary | ICD-10-CM | POA: Diagnosis not present

## 2017-10-12 DIAGNOSIS — C7989 Secondary malignant neoplasm of other specified sites: Secondary | ICD-10-CM | POA: Diagnosis not present

## 2017-10-12 DIAGNOSIS — R634 Abnormal weight loss: Secondary | ICD-10-CM | POA: Insufficient documentation

## 2017-10-12 DIAGNOSIS — R918 Other nonspecific abnormal finding of lung field: Secondary | ICD-10-CM

## 2017-10-12 DIAGNOSIS — Z79899 Other long term (current) drug therapy: Secondary | ICD-10-CM | POA: Insufficient documentation

## 2017-10-12 DIAGNOSIS — C770 Secondary and unspecified malignant neoplasm of lymph nodes of head, face and neck: Secondary | ICD-10-CM | POA: Diagnosis not present

## 2017-10-12 DIAGNOSIS — R221 Localized swelling, mass and lump, neck: Secondary | ICD-10-CM

## 2017-10-12 DIAGNOSIS — K769 Liver disease, unspecified: Secondary | ICD-10-CM | POA: Diagnosis present

## 2017-10-12 DIAGNOSIS — R222 Localized swelling, mass and lump, trunk: Secondary | ICD-10-CM

## 2017-10-12 DIAGNOSIS — C7951 Secondary malignant neoplasm of bone: Secondary | ICD-10-CM | POA: Diagnosis not present

## 2017-10-12 DIAGNOSIS — K219 Gastro-esophageal reflux disease without esophagitis: Secondary | ICD-10-CM | POA: Diagnosis not present

## 2017-10-12 DIAGNOSIS — C787 Secondary malignant neoplasm of liver and intrahepatic bile duct: Secondary | ICD-10-CM | POA: Diagnosis not present

## 2017-10-12 DIAGNOSIS — I110 Hypertensive heart disease with heart failure: Secondary | ICD-10-CM | POA: Diagnosis not present

## 2017-10-12 DIAGNOSIS — Z8673 Personal history of transient ischemic attack (TIA), and cerebral infarction without residual deficits: Secondary | ICD-10-CM | POA: Insufficient documentation

## 2017-10-12 DIAGNOSIS — K449 Diaphragmatic hernia without obstruction or gangrene: Secondary | ICD-10-CM | POA: Insufficient documentation

## 2017-10-12 DIAGNOSIS — I5032 Chronic diastolic (congestive) heart failure: Secondary | ICD-10-CM | POA: Insufficient documentation

## 2017-10-12 DIAGNOSIS — F419 Anxiety disorder, unspecified: Secondary | ICD-10-CM | POA: Diagnosis not present

## 2017-10-12 DIAGNOSIS — I4891 Unspecified atrial fibrillation: Secondary | ICD-10-CM | POA: Diagnosis not present

## 2017-10-12 HISTORY — DX: Gastro-esophageal reflux disease without esophagitis: K21.9

## 2017-10-12 HISTORY — DX: Cerebral infarction, unspecified: I63.9

## 2017-10-12 HISTORY — DX: Unspecified osteoarthritis, unspecified site: M19.90

## 2017-10-12 HISTORY — DX: Malignant (primary) neoplasm, unspecified: C80.1

## 2017-10-12 HISTORY — DX: Personal history of other diseases of the digestive system: Z87.19

## 2017-10-12 HISTORY — DX: Cardiac arrhythmia, unspecified: I49.9

## 2017-10-12 MED ORDER — FENTANYL CITRATE (PF) 100 MCG/2ML IJ SOLN
INTRAMUSCULAR | Status: AC | PRN
  Administered 2017-10-12 (×2): 12.5 ug via INTRAVENOUS
  Administered 2017-10-12: 25 ug via INTRAVENOUS

## 2017-10-12 MED ORDER — MIDAZOLAM HCL 5 MG/5ML IJ SOLN
INTRAMUSCULAR | Status: AC | PRN
  Administered 2017-10-12 (×2): 0.5 mg via INTRAVENOUS

## 2017-10-12 MED ORDER — SODIUM CHLORIDE 0.9 % IV SOLN
INTRAVENOUS | Status: DC
  Administered 2017-10-12: 10:00:00 via INTRAVENOUS

## 2017-10-12 NOTE — Consult Note (Signed)
Chief Complaint: Patient was seen in consultation today for US guided left posterior neck/upper back soft tissue mass vs liver lesion biopsy  Referring Physician(s): Finnegan,Timothy J  Supervising Physician: Aletta Edouard  Patient Status: [facility] - Out-pt  History of Present Illness: Theresa Harris is a 82 y.o. female with declining performance status, weight loss, chronic cough, shoulder and back pain, confusion and weakness found to have large hypermetabolic right lung mass with associated mediastinal invasion, bilateral pulmonary nodules, liver lesions, lytic bone lesions as well as skeletal muscle masses.  She has no prior history of cancer. She  presents today for image guided biopsy of left posterior neck/upper back soft tissue mass versus liver lesion.  Past Medical History:  Diagnosis Date  . Anxiety   . Arthritis   . Atherosclerosis of abdominal aorta ([place])   . Atrial fibrillation ([place])   . Cancer ([place])    lung primary-mets  . CHF (congestive heart failure) ([place])   . Chronic diastolic heart failure ([place])   . Dysrhythmia    a. fib.  Marland Kitchen GERD (gastroesophageal reflux disease)   . Hemorrhagic stroke ([place])   . History of hiatal hernia   . HTN (hypertension)   . Spinal stenosis   . Stroke [facility])    09/2015    Past Surgical History:  Procedure Laterality Date  . ABDOMINAL HYSTERECTOMY    . CARPAL TUNNEL RELEASE    . CHOLECYSTECTOMY    . HIP SURGERY    . KIDNEY SURGERY Right    "tacked up"  . KNEE SURGERY     right  . ROTATOR CUFF REPAIR Right   . TONSILLECTOMY      Allergies: Morphine and related and Oxycodone  Medications: Prior to Admission medications   Medication Sig Start Date End Date Taking? Authorizing Provider  Cholecalciferol (VITAMIN D3) 2000 units TABS Take by mouth.   Yes [provider]  diphenhydramine-acetaminophen (TYLENOL PM) 25-500 MG TABS tablet Take 1 tablet at bedtime as needed by mouth.   Yes [provider]    estradiol (ESTRACE) 1 MG tablet Take 0.5 tablets (0.5 mg total) by mouth daily. 11/05/15  Yes Angiulli, Lavon Paganini, PA-C  losartan (COZAAR) 100 MG tablet Take 100 mg daily by mouth.   Yes [provider]  magnesium oxide (MAG-OX) 400 MG tablet Take 1 tablet (400 mg total) by mouth daily. 11/05/15  Yes Angiulli, Lavon Paganini, PA-C  metoprolol tartrate (LOPRESSOR) 25 MG tablet Take 25 mg 2 (two) times daily by mouth.   Yes [provider]  omeprazole (PRILOSEC) 20 MG capsule Take 1 capsule (20 mg total) by mouth 2 (two) times daily. 11/05/15  Yes Angiulli, Lavon Paganini, PA-C  oxazepam (SERAX) 15 MG capsule Take 1 capsule by mouth 3 (three) times daily as needed. 11/17/15  Yes [provider]  tiZANidine (ZANAFLEX) 2 MG tablet 1 mg bedtime and every 8 hours as needed 11/05/15  Yes Angiulli, Lavon Paganini, PA-C  dexamethasone (DECADRON) 4 MG tablet Take 1 tablet (4 mg total) daily by mouth. 10/11/17   Finnegan, Kathlene November, MD  fentaNYL (DURAGESIC - DOSED MCG/HR) 12 MCG/HR Place 1 patch (12.5 mcg total) every 3 (three) days onto the skin. 10/11/17   Lloyd Huger, MD  ondansetron (ZOFRAN-ODT) 4 MG disintegrating tablet Take 4 mg every 8 (eight) hours as needed by mouth for nausea or vomiting.    [provider]  senna (SENOKOT) 8.6 MG TABS tablet Take 1 tablet by mouth.  [provider]  sucralfate (CARAFATE) 1 g tablet  10/06/17   [provider]  traMADol (ULTRAM) 50 MG tablet Take every 6 (six) hours as needed by mouth.    [provider]     History reviewed. No pertinent family history.  Social History   Socioeconomic History  . Marital status: Widowed    Spouse name: None  . Number of children: None  . Years of education: None  . Highest education level: None  Social Needs  . Financial resource strain: None  . Food insecurity - worry: None  . Food insecurity - inability: None  . Transportation needs - medical: None  . Transportation  needs - non-medical: None  Occupational History  . None  Tobacco Use  . Smoking status: Former Smoker    Last attempt to quit: 10/13/1959    Years since quitting: 58.0  . Smokeless tobacco: Never Used  Substance and Sexual Activity  . Alcohol use: No  . Drug use: No  . Sexual activity: None  Other Topics Concern  . None  Social History Narrative  . None      Review of Systems see above; denies fever, vomiting, bleeding  Vital Signs: BP (!) 169/50   Pulse 90   Temp (!) 97.4 F (36.3 C) (Oral)   Resp (!) 26   Ht 5' (1.524 m)   SpO2 96%   BMI 21.48 kg/m   Physical Exam awake, sl drowsy, some confusion noted; chest clear to auscultation bilaterally anteriorly.  Heart with irregularly irregular rhythm; abdomen soft, positive bowel sounds, nontender.  No lower extremity edema.  Palpable soft tissue masses left posterior neck/ back region. Imaging: Ct Chest W Contrast  Result Date: 10/03/2017 CLINICAL DATA:  Chest pain.  Short breath.  Weight loss. EXAM: CT CHEST WITH CONTRAST TECHNIQUE: Multidetector CT imaging of the chest was performed during intravenous contrast administration. CONTRAST:  23mL ISOVUE-300 IOPAMIDOL (ISOVUE-300) INJECTION 61% COMPARISON:  Abdominal CT 07/20/2017 FINDINGS: Cardiovascular: No significant vascular findings. Normal heart size. No pericardial effusion. Mediastinum/Nodes: RIGHT hilar mass extends inot the right mediastinum measuring 3.7 x 4.2 cm. This mass surrounds the RIGHT upper lobe bronchus. Mass abuts the posterior wall of the superior vena cava. Lungs/Pleura: Several peripheral nodules in the RIGHT upper lobe and LEFT upper lobe. Small RIGHT lower lobe nodule measures 5 mm (image 78, series 3). Small RIGHT middle lobe nodule measures 5 mm (image 60, series 3). Scattered subcentimeter LEFT lung nodules. Upper Abdomen: Several large round hypoenhancing lesions in the LEFT and RIGHT hepatic lobes consistent with metastasis. Lesions are new from  noncontrast CT 07/20/2017. Example lesion includes 4.3 cm lesion in the subcapsular RIGHT hepatic lobe. Adjacent 2.7 cm lesion RIGHT hepatic lobe. In LEFT lateral hepatic lobe a 1.0 cm and 1.4 cm lesion (images 137 and 109, series 2 ) Musculoskeletal: Severe degenerate changes shoulders. Sclerotic within the manubrium on lateral projection (image 86, series 6) this smudgy with potential cortical involvement. Loss of definition of the posterior wall vertebral body at T1 (image 86, series 6) is concerning). IMPRESSION: 1. RIGHT hilar mass consistent with bronchogenic carcinoma. Consider small cell carcinoma. 2. Postobstructive nodularity in the RIGHT upper lobe. 3. Scattered small bilateral pulmonary nodules are indeterminate. 4. Hypermetabolic bilobar HEPATIC METASTASIS. 5. Concern for skeletal metastasis in the sternum and T1 vertebral body. 6. FDG PET scan would be beneficial for biopsy planning and staging. These results will be called to the ordering clinician or representative by the Radiologist Assistant, and  communication documented in the PACS or zVision Dashboard. Electronically Signed   By: Suzy Bouchard M.D.   On: 10/03/2017 17:04   Mr Jeri Cos NO Contrast  Result Date: 10/11/2017 CLINICAL DATA:  Lung carcinoma. Evaluation for intracranial metastatic disease. EXAM: MRI HEAD WITHOUT AND WITH CONTRAST TECHNIQUE: Multiplanar, multiecho pulse sequences of the brain and surrounding structures were obtained without and with intravenous contrast. CONTRAST:  1mL MULTIHANCE GADOBENATE DIMEGLUMINE 529 MG/ML IV SOLN COMPARISON:  Brain MRI 10/25/2015 PET CT 10/10/2017 FINDINGS: Brain: The midline structures are normal. There is no acute infarct or acute hemorrhage. Unchanged right frontal convexity meningioma measuring 1.4 cm. No signal change of the underlying brain. Bilateral old gangliothalamic lacunar infarcts. Mild periventricular white matter hyperintensity. No age-advanced or lobar predominant atrophy.  Hemosiderin deposition at site of prior right thalamic hemorrhage. Vascular: Major intracranial arterial and venous sinus flow voids are preserved. Skull and upper cervical spine: The visualized skull base, calvarium, upper cervical spine and extracranial soft tissues are normal. Sinuses/Orbits: The paranasal sinuses are clear and there is no mastoid or middle ear effusion. Bilateral lens replacements. Other: None IMPRESSION: 1. No intracranial metastatic disease or acute abnormality. 2. Unchanged 14 mm right frontal convexity meningioma. No abnormality of the underlying brain. Electronically Signed   By: Ulyses Jarred M.D.   On: 10/11/2017 14:32   Nm Pet Image Initial (pi) Skull Base To Thigh  Result Date: 10/10/2017 CLINICAL DATA:  Initial treatment strategy for lung mass. EXAM: NUCLEAR MEDICINE PET SKULL BASE TO THIGH TECHNIQUE: 12.06 mCi F-18 FDG was injected intravenously. Full-ring PET imaging was performed from the skull base to thigh after the radiotracer. CT data was obtained and used for attenuation correction and anatomic localization. FASTING BLOOD GLUCOSE:  Value: 90 mg/dl COMPARISON:  None. FINDINGS: NECK: There is a left supraclavicular lymph node measuring 8 mm within SUV max equal to 3.4. CHEST: Large right upper lobe paramediastinal mass with right paratracheal invasion is identified measuring 4.7 cm with an SUV max equal to 21.9. Small pulmonary nodules are identified within both lungs. Most of these are too small to reliably characterize by PET-CT. Index nodule within the anteromedial right upper lobe measures 8 mm within SUV max equal to 4.1. No hypermetabolic axillary lymph nodes. Mild cardiac enlargement. Aortic atherosclerosis. No pericardial effusion. ABDOMEN/PELVIS: Multifocal hypermetabolic liver metastases identified. The largest lesion is in the right lobe of liver measuring 8.4 within SUV max equal to 20.98. No abnormal uptake within the adrenal glands. The spleen is unremarkable.  No kidney mass or hydronephrosis visualized. Aortic atherosclerosis without aneurysm noted. Within the porta caval region there is a focal area of hypermetabolism within SUV max equal to 4.9. SKELETON: Multifocal hypermetabolic metastases identified within skeletal muscle. Mass within the upper posterior left chest wall measures 6.9 cm and has an SUV max equal to 4.2. Index lesion within the right gluteus musculature measures approximately 3 cm and has an SUV max equal to 8.56. Extensive multifocal hypermetabolic lytic bone metastases are identified. Of possible neurologic significance is a lesion involving to T1 vertebra which has an SUV max equal to 12.12. Other intensely hypermetabolic spinal lesions in by T7, T10 and T12. Large destructive lesion involving the left iliac bone measures approximately 5.2 cm and has an SUV max equal to 20.16. Bilateral hypermetabolic acetabular lesions are identified with underlying pathologic fractures, image number 207 and image 206 of series 3. SUV max associated with the right acetabulum equals 11.38. IMPRESSION: 1. Examination is positive for large hypermetabolic  mass within the medial right upper lobe with associated mediastinal invasion. 2. Numerous small bilateral pulmonary nodules compatible with metastatic disease. 3. Extensive liver metastasis 4. Widespread hypermetabolic lytic bone metastasis. Lesions involving the thoracic spine and pelvis may be of neurologic and/or orthopedic significance. 5. Multifocal skeletal muscle metastases. Electronically Signed   By: Kerby Moors M.D.   On: 10/10/2017 14:52    Labs:  CBC: Recent Labs    07/20/17 1318 10/11/17 1550  WBC 6.8 16.1*  HGB 12.2 12.1  HCT 35.4 37.4  PLT 242 276    COAGS: Recent Labs    10/11/17 1550  INR 1.27  APTT 29    BMP: Recent Labs    07/20/17 1318  NA 129*  K 4.1  CL 93*  CO2 28  GLUCOSE 134*  BUN 27*  CALCIUM 9.7  CREATININE 1.00  GFRNONAA 49*  GFRAA 57*    LIVER  FUNCTION TESTS: Recent Labs    07/20/17 1318  BILITOT 0.5  AST 22  ALT 12*  ALKPHOS 57  PROT 7.5  ALBUMIN 4.0    TUMOR MARKERS: No results for input(s): AFPTM, CEA, CA199, CHROMGRNA in the last 8760 hours.  Assessment and Plan: 83 y.o. female with declining performance status, weight loss, chronic cough, shoulder and back pain, confusion and weakness found to have large hypermetabolic right lung mass with associated mediastinal invasion, bilateral pulmonary nodules, liver lesions, lytic bone lesions as well as skeletal muscle masses.  She has no prior history of cancer. She  presents today for image guided biopsy of left posterior neck/upper back soft tissue mass versus liver lesion.Risks and benefits discussed with the patient/daughter including, but not limited to bleeding, infection, damage to adjacent structures or low yield requiring additional tests. All of the patient's questions were answered, patient is agreeable to proceed. Consent signed and in chart.     Thank you for this interesting consult.  I greatly enjoyed meeting Theresa Harris and look forward to participating in their care.  A copy of this report was sent to the requesting provider on this date.  Electronically Signed: D. Rowe Robert, PA-C 10/12/2017, 10:10 AM   I spent a total of  25 minutes   in face to face in clinical consultation, greater than 50% of which was counseling/coordinating care for image guided biopsy of left posterior neck/upper back soft tissue mass vs liver lesion

## 2017-10-12 NOTE — Procedures (Signed)
Interventional Radiology Procedure Note  Procedure: US guided core biopsy of left neck soft tissue mass  Complications: None  Estimated Blood Loss: < 10 mL  4 cm soft tissue mass at base of posterior left neck.  Sampled with 18 G core device x 4.    Venetia Night. Kathlene Cote, M.D Pager:  [phone]

## 2017-10-12 NOTE — Progress Notes (Signed)
  Oncology Nurse Navigator Documentation  Navigator Location: [facility]-Med Onc (10/11/17 1600)   )Navigator Encounter Type: Initial RadOnc;Diagnostic Results;Follow-up Appt (10/11/17 1600)                     Patient Visit Type: MedOnc;RadOnc (10/11/17 1600)   Barriers/Navigation Needs: Coordination of Care;Education (10/11/17 1600) Education: Pain/ Symptom Management (10/11/17 1600) Interventions: Coordination of Care;Education (10/11/17 1600)   Coordination of Care: Appts (10/11/17 1600) Education Method: Verbal (10/11/17 1600)         met with patient and pt's daughter, Loletha Carrow, during multiple appointments today. Provided education regarding pain and symptom management to pt's daughter. Informed about biopsy tomorrow morning and informed that will follow up with NP while Dr. Grayland Ormond is out of the office next week to review results and discuss treatment options. Pt given prescription for fentanyl patches and pt's daughter was instructed on how to use. Pt has transportation concerns. Transportation is arranged through [facility] for biopsy tomorrow and pt is added to [place] schedule for follow up appt and CT Sim scheduled on 11/21. Pt's daughter stated that will be 15 min late to biopsy appt tomorrow morning. Bryson Ha in specials made aware and advised for pt to get labs drawn at [place] since will be late in the AM. All upcoming appts reviewed with pt's daughter. All questions answered. Pt's daughter verbalized understanding. Instructed to call with any further questions or concerns. Nothing further needed at this time.       Time Spent with Patient: > 120 (10/11/17 1600)

## 2017-10-14 ENCOUNTER — Emergency Department: Payer: PPO

## 2017-10-14 ENCOUNTER — Other Ambulatory Visit: Payer: Self-pay

## 2017-10-14 ENCOUNTER — Encounter: Payer: Self-pay | Admitting: Emergency Medicine

## 2017-10-14 ENCOUNTER — Inpatient Hospital Stay
Admission: EM | Admit: 2017-10-14 | Discharge: 2017-10-27 | DRG: 180 | Disposition: E | Payer: PPO | Attending: Internal Medicine | Admitting: Internal Medicine

## 2017-10-14 DIAGNOSIS — Z90710 Acquired absence of both cervix and uterus: Secondary | ICD-10-CM

## 2017-10-14 DIAGNOSIS — R509 Fever, unspecified: Secondary | ICD-10-CM | POA: Diagnosis not present

## 2017-10-14 DIAGNOSIS — R079 Chest pain, unspecified: Secondary | ICD-10-CM | POA: Diagnosis not present

## 2017-10-14 DIAGNOSIS — C787 Secondary malignant neoplasm of liver and intrahepatic bile duct: Secondary | ICD-10-CM | POA: Diagnosis present

## 2017-10-14 DIAGNOSIS — M129 Arthropathy, unspecified: Secondary | ICD-10-CM | POA: Diagnosis not present

## 2017-10-14 DIAGNOSIS — F419 Anxiety disorder, unspecified: Secondary | ICD-10-CM | POA: Diagnosis present

## 2017-10-14 DIAGNOSIS — C7951 Secondary malignant neoplasm of bone: Secondary | ICD-10-CM | POA: Diagnosis present

## 2017-10-14 DIAGNOSIS — I5032 Chronic diastolic (congestive) heart failure: Secondary | ICD-10-CM | POA: Diagnosis present

## 2017-10-14 DIAGNOSIS — R627 Adult failure to thrive: Secondary | ICD-10-CM | POA: Diagnosis not present

## 2017-10-14 DIAGNOSIS — D72829 Elevated white blood cell count, unspecified: Secondary | ICD-10-CM | POA: Diagnosis not present

## 2017-10-14 DIAGNOSIS — C3491 Malignant neoplasm of unspecified part of right bronchus or lung: Principal | ICD-10-CM | POA: Diagnosis present

## 2017-10-14 DIAGNOSIS — Z6821 Body mass index (BMI) 21.0-21.9, adult: Secondary | ICD-10-CM

## 2017-10-14 DIAGNOSIS — Z66 Do not resuscitate: Secondary | ICD-10-CM | POA: Diagnosis present

## 2017-10-14 DIAGNOSIS — Z85118 Personal history of other malignant neoplasm of bronchus and lung: Secondary | ICD-10-CM

## 2017-10-14 DIAGNOSIS — I700 Atherosclerosis of aorta: Secondary | ICD-10-CM | POA: Diagnosis not present

## 2017-10-14 DIAGNOSIS — R682 Dry mouth, unspecified: Secondary | ICD-10-CM | POA: Diagnosis not present

## 2017-10-14 DIAGNOSIS — R4182 Altered mental status, unspecified: Secondary | ICD-10-CM | POA: Diagnosis not present

## 2017-10-14 DIAGNOSIS — R4020 Unspecified coma: Secondary | ICD-10-CM | POA: Diagnosis not present

## 2017-10-14 DIAGNOSIS — C3401 Malignant neoplasm of right main bronchus: Secondary | ICD-10-CM | POA: Diagnosis not present

## 2017-10-14 DIAGNOSIS — E860 Dehydration: Secondary | ICD-10-CM | POA: Diagnosis not present

## 2017-10-14 DIAGNOSIS — I4891 Unspecified atrial fibrillation: Secondary | ICD-10-CM | POA: Diagnosis not present

## 2017-10-14 DIAGNOSIS — I110 Hypertensive heart disease with heart failure: Secondary | ICD-10-CM | POA: Diagnosis present

## 2017-10-14 DIAGNOSIS — I10 Essential (primary) hypertension: Secondary | ICD-10-CM | POA: Diagnosis not present

## 2017-10-14 DIAGNOSIS — Z8673 Personal history of transient ischemic attack (TIA), and cerebral infarction without residual deficits: Secondary | ICD-10-CM

## 2017-10-14 DIAGNOSIS — K7460 Unspecified cirrhosis of liver: Secondary | ICD-10-CM | POA: Diagnosis not present

## 2017-10-14 DIAGNOSIS — E43 Unspecified severe protein-calorie malnutrition: Secondary | ICD-10-CM | POA: Diagnosis present

## 2017-10-14 DIAGNOSIS — I4510 Unspecified right bundle-branch block: Secondary | ICD-10-CM | POA: Diagnosis not present

## 2017-10-14 DIAGNOSIS — R0602 Shortness of breath: Secondary | ICD-10-CM | POA: Diagnosis not present

## 2017-10-14 DIAGNOSIS — G9341 Metabolic encephalopathy: Secondary | ICD-10-CM | POA: Diagnosis not present

## 2017-10-14 DIAGNOSIS — Z7401 Bed confinement status: Secondary | ICD-10-CM

## 2017-10-14 DIAGNOSIS — K449 Diaphragmatic hernia without obstruction or gangrene: Secondary | ICD-10-CM | POA: Diagnosis not present

## 2017-10-14 DIAGNOSIS — Z79891 Long term (current) use of opiate analgesic: Secondary | ICD-10-CM | POA: Diagnosis not present

## 2017-10-14 DIAGNOSIS — R52 Pain, unspecified: Secondary | ICD-10-CM

## 2017-10-14 DIAGNOSIS — G893 Neoplasm related pain (acute) (chronic): Secondary | ICD-10-CM | POA: Diagnosis not present

## 2017-10-14 DIAGNOSIS — M4800 Spinal stenosis, site unspecified: Secondary | ICD-10-CM | POA: Diagnosis not present

## 2017-10-14 DIAGNOSIS — Z7189 Other specified counseling: Secondary | ICD-10-CM | POA: Diagnosis not present

## 2017-10-14 DIAGNOSIS — K219 Gastro-esophageal reflux disease without esophagitis: Secondary | ICD-10-CM | POA: Diagnosis present

## 2017-10-14 DIAGNOSIS — Z515 Encounter for palliative care: Secondary | ICD-10-CM | POA: Diagnosis present

## 2017-10-14 DIAGNOSIS — C799 Secondary malignant neoplasm of unspecified site: Secondary | ICD-10-CM

## 2017-10-14 DIAGNOSIS — Z87891 Personal history of nicotine dependence: Secondary | ICD-10-CM | POA: Diagnosis not present

## 2017-10-14 DIAGNOSIS — Z79899 Other long term (current) drug therapy: Secondary | ICD-10-CM

## 2017-10-14 DIAGNOSIS — Z885 Allergy status to narcotic agent status: Secondary | ICD-10-CM

## 2017-10-14 DIAGNOSIS — Z9049 Acquired absence of other specified parts of digestive tract: Secondary | ICD-10-CM

## 2017-10-14 LAB — URINALYSIS, COMPLETE (UACMP) WITH MICROSCOPIC
BACTERIA UA: NONE SEEN
BILIRUBIN URINE: NEGATIVE
GLUCOSE, UA: NEGATIVE mg/dL
KETONES UR: 5 mg/dL — AB
LEUKOCYTES UA: NEGATIVE
NITRITE: NEGATIVE
PROTEIN: 30 mg/dL — AB
Specific Gravity, Urine: 1.018 (ref 1.005–1.030)
pH: 5 (ref 5.0–8.0)

## 2017-10-14 LAB — CBC WITH DIFFERENTIAL/PLATELET
BASOS ABS: 0.1 10*3/uL (ref 0–0.1)
BASOS PCT: 0 %
EOS ABS: 0.3 10*3/uL (ref 0–0.7)
Eosinophils Relative: 2 %
HEMATOCRIT: 39.9 % (ref 35.0–47.0)
Hemoglobin: 13 g/dL (ref 12.0–16.0)
Lymphocytes Relative: 6 %
Lymphs Abs: 1.2 10*3/uL (ref 1.0–3.6)
MCH: 27.2 pg (ref 26.0–34.0)
MCHC: 32.6 g/dL (ref 32.0–36.0)
MCV: 83.6 fL (ref 80.0–100.0)
MONO ABS: 1.7 10*3/uL — AB (ref 0.2–0.9)
MONOS PCT: 9 %
NEUTROS ABS: 15.7 10*3/uL — AB (ref 1.4–6.5)
Neutrophils Relative %: 83 %
Platelets: 271 10*3/uL (ref 150–440)
RBC: 4.77 MIL/uL (ref 3.80–5.20)
RDW: 14.3 % (ref 11.5–14.5)
WBC: 19 10*3/uL — ABNORMAL HIGH (ref 3.6–11.0)

## 2017-10-14 LAB — COMPREHENSIVE METABOLIC PANEL
ALBUMIN: 2.9 g/dL — AB (ref 3.5–5.0)
ALT: 19 U/L (ref 14–54)
ANION GAP: 13 (ref 5–15)
AST: 29 U/L (ref 15–41)
Alkaline Phosphatase: 91 U/L (ref 38–126)
BILIRUBIN TOTAL: 1.1 mg/dL (ref 0.3–1.2)
BUN: 36 mg/dL — AB (ref 6–20)
CHLORIDE: 101 mmol/L (ref 101–111)
CO2: 25 mmol/L (ref 22–32)
Calcium: 11.6 mg/dL — ABNORMAL HIGH (ref 8.9–10.3)
Creatinine, Ser: 0.84 mg/dL (ref 0.44–1.00)
GFR calc Af Amer: >60 mL/min (ref >60)
GFR calc non Af Amer: >60 mL/min (ref >60)
GLUCOSE: 114 mg/dL — AB (ref 65–99)
POTASSIUM: 4.5 mmol/L (ref 3.5–5.1)
SODIUM: 139 mmol/L (ref 135–145)
Total Protein: 7.3 g/dL (ref 6.5–8.1)

## 2017-10-14 LAB — AMMONIA: AMMONIA: 14 umol/L (ref 9–35)

## 2017-10-14 LAB — PROTIME-INR
INR: 1.32
Prothrombin Time: 16.3 seconds — ABNORMAL HIGH (ref 11.4–15.2)

## 2017-10-14 LAB — LIPASE, BLOOD: Lipase: 17 U/L (ref 11–51)

## 2017-10-14 MED ORDER — BISACODYL 10 MG RE SUPP: 10.0000 mg | Freq: Every day | RECTAL | Status: DC | PRN

## 2017-10-14 MED ORDER — DEXAMETHASONE 4 MG PO TABS: 4.0000 mg | ORAL_TABLET | Freq: Every day | ORAL | Status: DC

## 2017-10-14 MED ORDER — SODIUM CHLORIDE 0.9 % IV BOLUS (SEPSIS)
1000.0000 mL | Freq: Once | INTRAVENOUS | Status: AC
  Administered 2017-10-14: 1000 mL via INTRAVENOUS

## 2017-10-14 MED ORDER — FENTANYL 25 MCG/HR TD PT72
25.0000 ug | MEDICATED_PATCH | TRANSDERMAL | Status: DC
  Administered 2017-10-15: 25 ug via TRANSDERMAL
  Filled 2017-10-14: qty 1

## 2017-10-14 MED ORDER — SODIUM CHLORIDE 0.9 % IV SOLN
INTRAVENOUS | Status: DC
  Administered 2017-10-14 – 2017-10-15 (×3): via INTRAVENOUS

## 2017-10-14 MED ORDER — METOPROLOL TARTRATE 25 MG PO TABS: 25.0000 mg | ORAL_TABLET | Freq: Two times a day (BID) | ORAL | Status: DC

## 2017-10-14 MED ORDER — FENTANYL CITRATE (PF) 100 MCG/2ML IJ SOLN
25.0000 ug | INTRAMUSCULAR | Status: DC | PRN
  Administered 2017-10-15 (×2): 25 ug via INTRAVENOUS
  Filled 2017-10-14 (×2): qty 2

## 2017-10-14 MED ORDER — ACETAMINOPHEN 325 MG PO TABS: 650.0000 mg | ORAL_TABLET | Freq: Four times a day (QID) | ORAL | Status: DC | PRN

## 2017-10-14 MED ORDER — TIZANIDINE HCL 2 MG PO TABS
2.0000 mg | ORAL_TABLET | Freq: Four times a day (QID) | ORAL | Status: DC | PRN
  Filled 2017-10-14: qty 1

## 2017-10-14 MED ORDER — TRAMADOL HCL 50 MG PO TABS
50.0000 mg | ORAL_TABLET | Freq: Four times a day (QID) | ORAL | Status: DC | PRN
  Administered 2017-10-14: 50 mg via ORAL
  Filled 2017-10-14: qty 1

## 2017-10-14 MED ORDER — ONDANSETRON HCL 4 MG PO TABS: 4.0000 mg | ORAL_TABLET | Freq: Four times a day (QID) | ORAL | Status: DC | PRN

## 2017-10-14 MED ORDER — HEPARIN SODIUM (PORCINE) 5000 UNIT/ML IJ SOLN: 5000.0000 [IU] | Freq: Three times a day (TID) | INTRAMUSCULAR | Status: DC

## 2017-10-14 MED ORDER — ACETAMINOPHEN 650 MG RE SUPP
650.0000 mg | Freq: Four times a day (QID) | RECTAL | Status: DC | PRN
  Administered 2017-10-15: 650 mg via RECTAL
  Filled 2017-10-14: qty 1

## 2017-10-14 MED ORDER — LOSARTAN POTASSIUM 50 MG PO TABS
100.0000 mg | ORAL_TABLET | Freq: Every day | ORAL | Status: DC
  Administered 2017-10-14: 15:00:00 100 mg via ORAL
  Filled 2017-10-14: qty 2

## 2017-10-14 MED ORDER — ONDANSETRON HCL 4 MG/2ML IJ SOLN
4.0000 mg | Freq: Once | INTRAMUSCULAR | Status: AC
  Administered 2017-10-14: 4 mg via INTRAVENOUS
  Filled 2017-10-14: qty 2

## 2017-10-14 MED ORDER — ONDANSETRON HCL 4 MG/2ML IJ SOLN
4.0000 mg | Freq: Four times a day (QID) | INTRAMUSCULAR | Status: DC | PRN
  Administered 2017-10-14: 4 mg via INTRAVENOUS
  Filled 2017-10-14: qty 2

## 2017-10-14 MED ORDER — PANTOPRAZOLE SODIUM 40 MG IV SOLR
40.0000 mg | Freq: Two times a day (BID) | INTRAVENOUS | Status: DC
  Administered 2017-10-14 (×2): 40 mg via INTRAVENOUS
  Filled 2017-10-14 (×2): qty 40

## 2017-10-14 MED ORDER — SUCRALFATE 1 G PO TABS
1.0000 g | ORAL_TABLET | Freq: Four times a day (QID) | ORAL | Status: DC
  Filled 2017-10-14: qty 1

## 2017-10-14 MED ORDER — DOCUSATE SODIUM 100 MG PO CAPS: 100.0000 mg | ORAL_CAPSULE | Freq: Two times a day (BID) | ORAL | Status: DC

## 2017-10-14 MED ORDER — MAGNESIUM OXIDE 400 (241.3 MG) MG PO TABS: 400.0000 mg | ORAL_TABLET | Freq: Every day | ORAL | Status: DC

## 2017-10-14 MED ORDER — FENTANYL CITRATE (PF) 100 MCG/2ML IJ SOLN
25.0000 ug | Freq: Once | INTRAMUSCULAR | Status: AC
  Administered 2017-10-14: 25 ug via INTRAVENOUS
  Filled 2017-10-14: qty 2

## 2017-10-14 MED ORDER — LORAZEPAM 1 MG PO TABS: 1.0000 mg | ORAL_TABLET | ORAL | Status: DC | PRN

## 2017-10-14 NOTE — ED Notes (Signed)
Attempted report - charge nurse not available and will call back for report

## 2017-10-14 NOTE — ED Notes (Signed)
Pt unable to go to 1c - they are working on a new bed

## 2017-10-14 NOTE — ED Notes (Signed)
Patient transported to 1C by this RN. Greeted by Junious Dresser, RN in room as well as 1 nurse tech. Patient slid over with assistance from staff from ED stretcher to inpatient hospital bed. Patient tolerated well.

## 2017-10-14 NOTE — H&P (Signed)
History and Physical    Theresa Harris LNL:[id number] DOB: [birth year] DOA: 10/08/2017  Referring physician: Dr. Clearnce Hasten PCP: Kirk Ruths, MD  Specialists: Dr. Grayland Ormond  Chief Complaint: diffuse pain and weakness  HPI: Theresa Harris is a 83 y.o. female has a past medical history significant for A-fib, HTN, CHF and metastatic cancer of unknown primary actively being worked up by Oncology. Lives at home. Now in ER with progressive pain and weakness with failure to thrive. Family unable to care for pt at home. In ER, pt is lethargic and moaning in pain. WBC elevated. Dehydrated on exam. She is now admitted. No apparent fever. No N/V/D. No cough or SOB  Review of Systems: unable to obtain from pt due to somnolence  Past Medical History:  Diagnosis Date  . Anxiety   . Arthritis   . Atherosclerosis of abdominal aorta ([place])   . Atrial fibrillation ([place])   . Cancer ([place])    lung primary-mets  . CHF (congestive heart failure) ([place])   . Chronic diastolic heart failure ([place])   . Dysrhythmia    a. fib.  Marland Kitchen GERD (gastroesophageal reflux disease)   . Hemorrhagic stroke ([place])   . History of hiatal hernia   . HTN (hypertension)   . Spinal stenosis   . Stroke [facility])    09/2015   Past Surgical History:  Procedure Laterality Date  . ABDOMINAL HYSTERECTOMY    . CARPAL TUNNEL RELEASE    . CHOLECYSTECTOMY    . HIP SURGERY    . KIDNEY SURGERY Right    "tacked up"  . KNEE SURGERY     right  . ROTATOR CUFF REPAIR Right   . TONSILLECTOMY     Social History:  reports that she quit smoking about 58 years ago. she has never used smokeless tobacco. She reports that she does not drink alcohol or use drugs.  Allergies  Allergen Reactions  . Morphine And Related Nausea And Vomiting  . Oxycodone Itching    History reviewed. No pertinent family history.  Prior to Admission medications   Medication Sig Start Date End Date Taking? Authorizing Provider  Cholecalciferol (VITAMIN D3)  2000 units TABS Take by mouth.   Yes [provider]  estradiol (ESTRACE) 1 MG tablet Take 0.5 tablets (0.5 mg total) by mouth daily. 11/05/15  Yes Angiulli, Lavon Paganini, PA-C  fentaNYL (DURAGESIC - DOSED MCG/HR) 12 MCG/HR Place 1 patch (12.5 mcg total) every 3 (three) days onto the skin. 10/11/17  Yes Lloyd Huger, MD  losartan (COZAAR) 100 MG tablet Take 100 mg daily by mouth.   Yes [provider]  magnesium oxide (MAG-OX) 400 MG tablet Take 1 tablet (400 mg total) by mouth daily. 11/05/15  Yes Angiulli, Lavon Paganini, PA-C  megestrol (MEGACE) 40 MG tablet Take 40 mg daily by mouth.   Yes [provider]  metoprolol tartrate (LOPRESSOR) 25 MG tablet Take 25 mg 2 (two) times daily by mouth.   Yes [provider]  omeprazole (PRILOSEC) 20 MG capsule Take 1 capsule (20 mg total) by mouth 2 (two) times daily. 11/05/15  Yes Angiulli, Lavon Paganini, PA-C  ondansetron (ZOFRAN-ODT) 4 MG disintegrating tablet Take 4 mg every 8 (eight) hours as needed by mouth for nausea or vomiting.   Yes [provider]  oxazepam (SERAX) 15 MG capsule Take 1 capsule 2 (two) times daily by mouth.  11/17/15  Yes [provider]  senna (SENOKOT) 8.6 MG TABS tablet Take 1 tablet  by mouth.   Yes [provider]  traMADol (ULTRAM) 50 MG tablet Take every 6 (six) hours as needed by mouth.   Yes [provider]  dexamethasone (DECADRON) 4 MG tablet Take 1 tablet (4 mg total) daily by mouth. 10/11/17   Finnegan, Kathlene November, MD  diphenhydramine-acetaminophen (TYLENOL PM) 25-500 MG TABS tablet Take 1 tablet at bedtime as needed by mouth.    [provider]  sucralfate (CARAFATE) 1 g tablet  10/06/17   [provider]  tiZANidine (ZANAFLEX) 2 MG tablet 1 mg bedtime and every 8 hours as needed 11/05/15   Cathlyn Parsons, PA-C   Physical Exam: Vitals:   10/24/2017 0940 10/20/2017 1000 10/21/2017 1030  BP:  (!) 191/71 (!) 174/62  Pulse:  94 90  Resp:  (!)  41 (!) 26  SpO2:  97% 99%  Weight: 49.9 kg (110 lb)    Height: 5' (1.524 m)       General:  WDWN. [place]/AT, in moderate distress  Eyes: PERRL, EOMI, no scleral icterus, conjunctiva clear  ENT: dry oropharynx without exudate, TM's benign, dentition good  Neck: supple, no lymphadenopathy. No bruits or thyromegaly  Cardiovascular: irregularly irregular without MRG; 2+ peripheral pulses, no JVD, no peripheral edema  Respiratory: basilar rhonchi, good air movement without wheezing,  or crackles. No dullness. Respiratory effort normal  Abdomen: soft, diffusely tender to palpation, positive bowel sounds, no guarding, no rebound  Skin: no rashes or lesions  Musculoskeletal: normal bulk and tone, no joint swelling  Psychiatric: lethargic oriented to person only  Neurologic: CN 2-12 grossly intact, Motor strength 5/5 in all 4 groups with symmetric DTR's and non-focal sensory exam  Labs on Admission:  Basic Metabolic Panel: Recent Labs  Lab 09/27/2017 0845  NA 139  K 4.5  CL 101  CO2 25  GLUCOSE 114*  BUN 36*  CREATININE 0.84  CALCIUM 11.6*   Liver Function Tests: Recent Labs  Lab 09/27/2017 0845  AST 29  ALT 19  ALKPHOS 91  BILITOT 1.1  PROT 7.3  ALBUMIN 2.9*   Recent Labs  Lab 09/27/2017 0845  LIPASE 17   Recent Labs  Lab 10/23/2017 0845  AMMONIA 14   CBC: Recent Labs  Lab 10/11/17 1550 10/24/2017 0845  WBC 16.1* 19.0*  NEUTROABS 13.2* 15.7*  HGB 12.1 13.0  HCT 37.4 39.9  MCV 83.7 83.6  PLT 276 271   Cardiac Enzymes: No results for input(s): CKTOTAL, CKMB, CKMBINDEX, TROPONINI in the last 168 hours.  BNP (last 3 results) No results for input(s): BNP in the last 8760 hours.  ProBNP (last 3 results) No results for input(s): PROBNP in the last 8760 hours.  CBG: Recent Labs  Lab 10/10/17 1229  GLUCAP 90    Radiological Exams on Admission: Dg Chest 1 View  Result Date: 10/02/2017 CLINICAL DATA:  Shortness of breath, chest pain EXAM: CHEST 1 VIEW  COMPARISON:  Chest CT 10/03/2017 FINDINGS: Elevation of the right hemidiaphragm. Right suprahilar upper lobe mass again noted as seen on recent CT. Heart is normal size. Left lung is clear. No effusions or acute bony abnormality. IMPRESSION: Right hilar/suprahilar mass again noted. Elevation the right hemidiaphragm. Electronically Signed   By: Rolm Baptise M.D.   On: 10/19/2017 09:24   Ct Head Wo Contrast  Result Date: 09/29/2017 CLINICAL DATA:  Altered level of consciousness. Urinary tract infection. Personal history of lung cancer. EXAM: CT HEAD WITHOUT CONTRAST TECHNIQUE: Contiguous axial images were obtained from the base of the  skull through the vertex without intravenous contrast. COMPARISON:  MRI brain 10/11/2017. FINDINGS: Brain: Mild atrophy and white matter changes are again noted. No acute cortical infarct, hemorrhage, or mass lesion is present. Remote lacunar infarcts involving the right thalamus and left acute a man of are noted. Basal ganglia are otherwise intact. The insular ribbon is normal. No acute or focal cortical lesion is present. A right frontal convexity meningioma is stable. Brainstem and cerebellum are normal. Vascular: Atherosclerotic calcifications are present within the cavernous internal carotid arteries bilaterally. There is no hyperdense vessel. Skull: The calvarium is intact. No focal lytic or blastic lesions are present. Sinuses/Orbits: The paranasal sinuses and mastoid air cells are clear. Globes and orbits are within normal limits. IMPRESSION: 1. No acute intracranial abnormality or significant interval change. 2. Stable atrophy and white matter disease. 3. Stable calcified right frontal meningioma. Electronically Signed   By: San Morelle M.D.   On: 10/06/2017 09:33    EKG: Independently reviewed.  Assessment/Plan Principal Problem:   Metastatic cancer (HCC) Active Problems:   Failure to thrive in adult   Dehydration   Generalized pain   Will observe on  floor with IV fluids and prn IV pain meds. IV Zofran as needed. DNR. Consult Oncology and Palliative care. Soft diet. Prognosis poor  Diet: soft Fluids: NS@100  DVT Prophylaxis: SQ Heparin  Code Status: DNR  Family Communication: yes  Disposition Plan: TBD  Time spent: 50 min

## 2017-10-14 NOTE — ED Triage Notes (Signed)
Patient from home via [facility] with c/o altered mental status, increasing weakness, foul smelling urine, and Incontinence. Patient was recently diagnosed with stage 4 lung CA with mets and cirrhosis.  Patient will not respond to questioning, but continues to scream incoherently

## 2017-10-14 NOTE — ED Provider Notes (Signed)
[facility] Emergency Department Provider Note  ____________________________________________   First MD Initiated Contact with Patient 09/27/2017 [phone]     (approximate)  I have reviewed the triage vital signs and the nursing notes.   HISTORY  Chief Complaint Altered Mental Status   HPI Theresa Harris is a 83 y.o. female a history of metastatic lung cancer as well as CHF, liver metastases with cirrhosis who is presenting to the emergency department with decreased mentation, foul-smelling urine and worsening weakness.  The patient is unable to give further history.  She is following commands but just moans for her verbal response.  On review of the patient's records from gastroenterology it appears that as of earlier this month she had been eating less and becoming weaker.  She also had a recent biopsy of a left neck mass on 16 November.   Past Medical History:  Diagnosis Date  . Anxiety   . Arthritis   . Atherosclerosis of abdominal aorta ([place])   . Atrial fibrillation ([place])   . Cancer ([place])    lung primary-mets  . CHF (congestive heart failure) ([place])   . Chronic diastolic heart failure ([place])   . Dysrhythmia    a. fib.  Marland Kitchen GERD (gastroesophageal reflux disease)   . Hemorrhagic stroke ([place])   . History of hiatal hernia   . HTN (hypertension)   . Spinal stenosis   . Stroke [facility])    09/2015    Patient Active Problem List   Diagnosis Date Noted  . Mass of right lung 10/05/2017  . Osteoarthritis of left knee 12/21/2015  . Nontraumatic subcortical hemorrhage of right cerebral hemisphere ([place])   . Chronic diastolic congestive heart failure ([place]) 10/28/2015  . Essential hypertension 10/28/2015  . Ataxia   . Sensation alteration, late effect of cerebrovascular disease   . Aneurysm, cerebral, nonruptured 10/26/2015  . Meningioma ([place]) 10/26/2015  . ICH (intracerebral hemorrhage) ([place]) 10/22/2015    Past Surgical History:  Procedure Laterality Date    . ABDOMINAL HYSTERECTOMY    . CARPAL TUNNEL RELEASE    . CHOLECYSTECTOMY    . HIP SURGERY    . KIDNEY SURGERY Right    "tacked up"  . KNEE SURGERY     right  . ROTATOR CUFF REPAIR Right   . TONSILLECTOMY      Prior to Admission medications   Medication Sig Start Date End Date Taking? Authorizing Provider  Cholecalciferol (VITAMIN D3) 2000 units TABS Take by mouth.    [provider]  dexamethasone (DECADRON) 4 MG tablet Take 1 tablet (4 mg total) daily by mouth. 10/11/17   Finnegan, Kathlene November, MD  diphenhydramine-acetaminophen (TYLENOL PM) 25-500 MG TABS tablet Take 1 tablet at bedtime as needed by mouth.    [provider]  estradiol (ESTRACE) 1 MG tablet Take 0.5 tablets (0.5 mg total) by mouth daily. 11/05/15   Angiulli, Lavon Paganini, PA-C  fentaNYL (DURAGESIC - DOSED MCG/HR) 12 MCG/HR Place 1 patch (12.5 mcg total) every 3 (three) days onto the skin. 10/11/17   Lloyd Huger, MD  losartan (COZAAR) 100 MG tablet Take 100 mg daily by mouth.    [provider]  magnesium oxide (MAG-OX) 400 MG tablet Take 1 tablet (400 mg total) by mouth daily. 11/05/15   Angiulli, Lavon Paganini, PA-C  metoprolol tartrate (LOPRESSOR) 25 MG tablet Take 25 mg 2 (two) times daily by mouth.    [provider]  omeprazole (PRILOSEC) 20 MG capsule Take 1 capsule (  20 mg total) by mouth 2 (two) times daily. 11/05/15   Angiulli, Lavon Paganini, PA-C  ondansetron (ZOFRAN-ODT) 4 MG disintegrating tablet Take 4 mg every 8 (eight) hours as needed by mouth for nausea or vomiting.    [provider]  oxazepam (SERAX) 15 MG capsule Take 1 capsule by mouth 3 (three) times daily as needed. 11/17/15   [provider]  senna (SENOKOT) 8.6 MG TABS tablet Take 1 tablet by mouth.    [provider]  sucralfate (CARAFATE) 1 g tablet  10/06/17   [provider]  tiZANidine (ZANAFLEX) 2 MG tablet 1 mg bedtime and every 8 hours as needed 11/05/15   Angiulli, Lavon Paganini, PA-C   traMADol (ULTRAM) 50 MG tablet Take every 6 (six) hours as needed by mouth.    [provider]    Allergies Morphine and related and Oxycodone  History reviewed. No pertinent family history.  Social History Social History   Tobacco Use  . Smoking status: Former Smoker    Last attempt to quit: 10/13/1959    Years since quitting: 58.0  . Smokeless tobacco: Never Used  Substance Use Topics  . Alcohol use: No  . Drug use: No    Review of Systems  Level 5 caveat secondary to altered mentation.   ____________________________________________   PHYSICAL EXAM:  VITAL SIGNS: ED Triage Vitals  Enc Vitals Group     BP      Pulse      Resp      Temp      Temp src      SpO2      Weight      Height      Head Circumference      Peak Flow      Pain Score      Pain Loc      Pain Edu?      Excl. in [place]?     Constitutional: Patient moaning.  Appears uncomfortable.  However follow simple commands. Eyes: Conjunctivae are normal.  PERRLA Head: Atraumatic. Nose: No congestion/rhinnorhea. Mouth/Throat: Mucous membranes are moist.  Neck: No stridor.  Left base of neck there is a mass that is about golf ball sized that is firm and with limited mobility but without any overlying erythema, warmth or fluctuance. Cardiovascular: Normal rate, regular rhythm. Grossly normal heart sounds.   Respiratory: Normal respiratory effort.  No retractions. Lungs CTAB. Gastrointestinal: Soft with mild tenderness to palpation to the upper quadrant. No distention. Musculoskeletal: No lower extremity tenderness nor edema.  No joint effusions. Neurologic:   No gross focal neurologic deficits are appreciated.  Moves all 4 extremities when asked.  No facial droop. Skin:  Skin is warm, dry and intact. No rash noted.  ____________________________________________   LABS (all labs ordered are listed, but only abnormal results are displayed)  Labs Reviewed  CBC WITH DIFFERENTIAL/PLATELET -  Abnormal; Notable for the following components:      Result Value   WBC 19.0 (*)    Neutro Abs 15.7 (*)    Monocytes Absolute 1.7 (*)    All other components within normal limits  COMPREHENSIVE METABOLIC PANEL - Abnormal; Notable for the following components:   Glucose, Bld 114 (*)    BUN 36 (*)    Calcium 11.6 (*)    Albumin 2.9 (*)    All other components within normal limits  URINALYSIS, COMPLETE (UACMP) WITH MICROSCOPIC - Abnormal; Notable for the following components:   Color, Urine YELLOW (*)  APPearance HAZY (*)    Hgb urine dipstick SMALL (*)    Ketones, ur 5 (*)    Protein, ur 30 (*)    Squamous Epithelial / LPF 0-5 (*)    All other components within normal limits  PROTIME-INR - Abnormal; Notable for the following components:   Prothrombin Time 16.3 (*)    All other components within normal limits  LIPASE, BLOOD  AMMONIA   ____________________________________________  EKG  ED ECG REPORT I, Doran Stabler, the attending physician, personally viewed and interpreted this ECG.   Date: 10/07/2017  EKG Time: 0853  Rate: 93  Rhythm: atrial fibrillation, rate 93  Axis: normal  Intervals:right bundle branch block  ST&T Change: No ST segment elevation or depression.  Biphasic T waves in V3.  T wave inversions in aVL. Right bundle branch block appears old when compared with other EKGs on the record.  No significant change from previous. ____________________________________________  RADIOLOGY  No acute intracranial process.  No acute chest process found.  Right hilar suprahilar mass again noted. ____________________________________________   PROCEDURES  Procedure(s) performed:   Procedures  Critical Care performed:   ____________________________________________   INITIAL IMPRESSION / ASSESSMENT AND PLAN / ED COURSE  Pertinent labs & imaging results that were available during my care of the patient were reviewed by me and considered in my medical  decision making (see chart for details).  Differential diagnosis includes, but is not limited to, alcohol, illicit or prescription medications, or other toxic ingestion; intracranial pathology such as stroke or intracerebral hemorrhage; fever or infectious causes including sepsis; hypoxemia and/or hypercarbia; uremia; trauma; endocrine related disorders such as diabetes, hypoglycemia, and thyroid-related diseases; hypertensive encephalopathy; etc.  As part of my medical decision making, I reviewed the following data within the [place] chart reviewed  MRI of 10/11/2017 without any brain metastases evident.  Recent biopsy of the left base of neck lesion has not resulted yet.    ----------------------------------------- 10:42 AM on 10/12/2017 -----------------------------------------  Patient's daughter is now at the bedside.  Patient's daughter reports that they have gotten the diagnosis of metastatic disease over the past week and is just been this morning where the patient has been too weak to get out of bed and has not eaten anything since a bowl of cereal yesterday morning.  The daughter says that she is incapable of caring for the patient at home.  Says that she does not want the patient to go to the hospice house this time.  Is also skeptical about home hospice because of the limited assistance that will be provided.  Patient will be admitted for failure to thrive.  Pain improved with fentanyl.  To be given IV fluids.  Signed out to Dr. Doy Hutching.  ____________________________________________   FINAL CLINICAL IMPRESSION(S) / ED DIAGNOSES  Failure to thrive.  Generalized pain.    NEW MEDICATIONS STARTED DURING THIS VISIT:  This SmartLink is deprecated. Use AVSMEDLIST instead to display the medication list for a patient.   Note:  This document was prepared using Dragon voice recognition software and may include unintentional dictation errors.     Orbie Pyo, MD 10/08/2017 1044

## 2017-10-15 DIAGNOSIS — M129 Arthropathy, unspecified: Secondary | ICD-10-CM

## 2017-10-15 DIAGNOSIS — I4891 Unspecified atrial fibrillation: Secondary | ICD-10-CM

## 2017-10-15 DIAGNOSIS — I10 Essential (primary) hypertension: Secondary | ICD-10-CM | POA: Diagnosis not present

## 2017-10-15 DIAGNOSIS — K449 Diaphragmatic hernia without obstruction or gangrene: Secondary | ICD-10-CM | POA: Diagnosis not present

## 2017-10-15 DIAGNOSIS — Z8673 Personal history of transient ischemic attack (TIA), and cerebral infarction without residual deficits: Secondary | ICD-10-CM

## 2017-10-15 DIAGNOSIS — Z515 Encounter for palliative care: Secondary | ICD-10-CM

## 2017-10-15 DIAGNOSIS — K219 Gastro-esophageal reflux disease without esophagitis: Secondary | ICD-10-CM

## 2017-10-15 DIAGNOSIS — E860 Dehydration: Secondary | ICD-10-CM

## 2017-10-15 DIAGNOSIS — F419 Anxiety disorder, unspecified: Secondary | ICD-10-CM | POA: Diagnosis not present

## 2017-10-15 DIAGNOSIS — Z7189 Other specified counseling: Secondary | ICD-10-CM

## 2017-10-15 DIAGNOSIS — C3401 Malignant neoplasm of right main bronchus: Secondary | ICD-10-CM | POA: Diagnosis not present

## 2017-10-15 DIAGNOSIS — Z79899 Other long term (current) drug therapy: Secondary | ICD-10-CM

## 2017-10-15 DIAGNOSIS — M4800 Spinal stenosis, site unspecified: Secondary | ICD-10-CM

## 2017-10-15 DIAGNOSIS — G893 Neoplasm related pain (acute) (chronic): Secondary | ICD-10-CM

## 2017-10-15 DIAGNOSIS — R52 Pain, unspecified: Secondary | ICD-10-CM | POA: Diagnosis not present

## 2017-10-15 DIAGNOSIS — R627 Adult failure to thrive: Secondary | ICD-10-CM

## 2017-10-15 DIAGNOSIS — Z87891 Personal history of nicotine dependence: Secondary | ICD-10-CM

## 2017-10-15 DIAGNOSIS — I5032 Chronic diastolic (congestive) heart failure: Secondary | ICD-10-CM | POA: Diagnosis not present

## 2017-10-15 DIAGNOSIS — I700 Atherosclerosis of aorta: Secondary | ICD-10-CM | POA: Diagnosis not present

## 2017-10-15 DIAGNOSIS — C799 Secondary malignant neoplasm of unspecified site: Secondary | ICD-10-CM | POA: Diagnosis not present

## 2017-10-15 LAB — CBC
HCT: 34.1 % — ABNORMAL LOW (ref 35.0–47.0)
Hemoglobin: 10.8 g/dL — ABNORMAL LOW (ref 12.0–16.0)
MCH: 27.1 pg (ref 26.0–34.0)
MCHC: 31.8 g/dL — AB (ref 32.0–36.0)
MCV: 85.2 fL (ref 80.0–100.0)
PLATELETS: 208 10*3/uL (ref 150–440)
RBC: 4.01 MIL/uL (ref 3.80–5.20)
RDW: 14.4 % (ref 11.5–14.5)
WBC: 17.1 10*3/uL — ABNORMAL HIGH (ref 3.6–11.0)

## 2017-10-15 LAB — COMPREHENSIVE METABOLIC PANEL
ALK PHOS: 68 U/L (ref 38–126)
ALT: 13 U/L — ABNORMAL LOW (ref 14–54)
ANION GAP: 8 (ref 5–15)
AST: 24 U/L (ref 15–41)
Albumin: 2.4 g/dL — ABNORMAL LOW (ref 3.5–5.0)
BUN: 35 mg/dL — ABNORMAL HIGH (ref 6–20)
CALCIUM: 10 mg/dL (ref 8.9–10.3)
CHLORIDE: 111 mmol/L (ref 101–111)
CO2: 24 mmol/L (ref 22–32)
Creatinine, Ser: 0.65 mg/dL (ref 0.44–1.00)
GFR calc non Af Amer: >60 mL/min (ref >60)
Glucose, Bld: 98 mg/dL (ref 65–99)
Potassium: 4.4 mmol/L (ref 3.5–5.1)
SODIUM: 143 mmol/L (ref 135–145)
Total Bilirubin: 0.7 mg/dL (ref 0.3–1.2)
Total Protein: 5.7 g/dL — ABNORMAL LOW (ref 6.5–8.1)

## 2017-10-15 MED ORDER — HYDROMORPHONE HCL 1 MG/ML IJ SOLN
0.5000 mg | INTRAMUSCULAR | Status: DC | PRN
  Administered 2017-10-16: 1 mg via INTRAVENOUS
  Filled 2017-10-15: qty 1

## 2017-10-15 MED ORDER — HYDROMORPHONE HCL 1 MG/ML IJ SOLN
1.0000 mg | INTRAMUSCULAR | Status: DC | PRN
  Administered 2017-10-15: 1 mg via INTRAVENOUS
  Filled 2017-10-15: qty 1

## 2017-10-15 NOTE — Progress Notes (Signed)
Patient is now comfort care. Patient's daughter at bedside throughout the day- waiting for other family to come from out of town tonight. Foley catheter inserted for comfort, Fentanyl patch increased & changed this morning, PRN IV pain medications given throughout the day with noted relief. Pt's family requesting IVF & oxygen remain in place until other family arrives.

## 2017-10-15 NOTE — Consult Note (Signed)
Consultation Note Date: 10/15/2017   Patient Name: Theresa Harris  DOB: [birth year]  MRN: [id number]  Age / Sex: 83 y.o., female  PCP: Kirk Ruths, MD Referring Physician: Gladstone Lighter, MD   Allergic to Morphine.   Reason for Consultation: Establishing goals of care and Terminal Care  HPI/Patient Profile: Patient is an 83 year old female with widely metastatic malignancy.  Patient's performance status has significantly declined over the past several weeks and even more so in the past 3-4 days.  By report she is having intractable pain every time she moves. Pain is controlled at this time.     Clinical Assessment and Goals of Care: Theresa Harris is resting in bed with partially eyes closed. She is not responsive to voice. Her family is at bedside. They state her pain was poorly controlled this morning and overnight.  Fentanyl patch increased this morning by hospitalist and IV Fentanyl in place.  Theresa Harris patient's daughter present at bedside with [place], patient's brother and his wife. Theresa Harris states she accompanies her mother to all appointments and helps her manage care, however she is not the POA. The POA is her sister Theresa Harris. I spoke with Theresa Harris via telephone and she states she is okay with not being a decision maker as she did not know she was supposed to be the POA. Spoke with secondary POA Theresa Harris who is also a daughter of Theresa Harris and sister of Theresa Harris who does not wish to be the primary Media planner and states Theresa Harris can make decisions. Phone call to Theresa Harris by Florentina Jenny on speaker phone, who's number is not listed in contacts, who is okay with the plans determined for his mother's care.       Discussed diagnosis, prognosis, GOC, EOL wishes disposition and options.  A detailed discussion was had today regarding advanced directives.  Concepts specific to code status, hydration, IV  antibiotics and rehospitalization was discussed.  The difference between an aggressive medical intervention path and a hospice comfort care path for this patient was discussed.  Values and goals of care important to patient and family were discussed.  They state 4 weeks ago, Theresa Harris was dressing herself, doing her banking and grocery shopping,  doing her banking, and driving. 2 weeks ago she needed the use of a wheel chair. 1 week ago she was able to get out of bed herself and she managed her check book. This hospitalization, she is unable to turn herself and is incoherent. They state she has been calling to her mother who is deceased. Family states she has lost 15 pounds in the past month, and 5 pounds just in the past week. Her albumin is 2.4. She has not eaten but a few tablespoons of applesauce with medications recently, and today is clenching teeth and will not take any medication orally.    Questions addressed and discussed.   Children would like to pursue comfort care for Theresa Harris. They are discussing home with hospice vs hospice facility. Spoke with Theresa Harris's daughter  who is a nurse to explain care needed for home hospice.   MOST form completed    SUMMARY OF RECOMMENDATIONS   Comfort care measures in place.   Allergic to Morphine.    Code Status/Advance Care Planning:  DNR    Symptom Management:   Home Fentanyl patch increased to 56mcg/hr from 12.7mcg/ hr this morning by admitting team.   PRN Dilaudid in place for breakthrough pain.  Ativan PRN for anxiety.  Tylenol for fever.   Palliative Prophylaxis:   Aspiration  Additional Recommendations (Limitations, Scope, Preferences):  Full Comfort Care  Prognosis:   < 2 weeks Weight loss, malnutrition, refusal to take oral medication, metastatic CA, electrolyte inbalance,    Discharge Planning: To Be Determined      Primary Diagnoses: Present on Admission: **None**   I have reviewed the medical record,  interviewed the patient and family, and examined the patient. The following aspects are pertinent.  Past Medical History:  Diagnosis Date  . Anxiety   . Arthritis   . Atherosclerosis of abdominal aorta ([place])   . Atrial fibrillation ([place])   . Cancer ([place])    lung primary-mets  . CHF (congestive heart failure) ([place])   . Chronic diastolic heart failure ([place])   . Dysrhythmia    a. fib.  [place] Kitchen GERD (gastroesophageal reflux disease)   . Hemorrhagic stroke ([place])   . History of hiatal hernia   . HTN (hypertension)   . Spinal stenosis   . Stroke [facility])    09/2015   Social History   Socioeconomic History  . Marital status: Widowed    Spouse name: None  . Number of children: None  . Years of education: None  . Highest education level: None  Social Needs  . Financial resource strain: None  . Food insecurity - worry: None  . Food insecurity - inability: None  . Transportation needs - medical: None  . Transportation needs - non-medical: None  Occupational History  . None  Tobacco Use  . Smoking status: Former Smoker    Last attempt to quit: 10/13/1959    Years since quitting: 58.0  . Smokeless tobacco: Never Used  Substance and Sexual Activity  . Alcohol use: No  . Drug use: No  . Sexual activity: None  Other Topics Concern  . None  Social History Narrative  . None   History reviewed. No pertinent family history. Scheduled Meds: . dexamethasone  4 mg Oral Daily  . fentaNYL  25 mcg Transdermal Q72H   Continuous Infusions: PRN Meds:.acetaminophen **OR** acetaminophen, bisacodyl, HYDROmorphone (DILAUDID) injection, LORazepam, ondansetron **OR** ondansetron (ZOFRAN) IV Medications Prior to Admission:  Prior to Admission medications   Medication Sig Start Date End Date Taking? Authorizing Provider  Cholecalciferol (VITAMIN D3) 2000 units TABS Take by mouth.   Yes [provider]  estradiol (ESTRACE) 1 MG tablet Take 0.5 tablets (0.5 mg total) by mouth daily. 11/05/15   Yes Angiulli, Lavon Paganini, PA-C  fentaNYL (DURAGESIC - DOSED MCG/HR) 12 MCG/HR Place 1 patch (12.5 mcg total) every 3 (three) days onto the skin. 10/11/17  Yes Lloyd Huger, MD  losartan (COZAAR) 100 MG tablet Take 100 mg daily by mouth.   Yes [provider]  magnesium oxide (MAG-OX) 400 MG tablet Take 1 tablet (400 mg total) by mouth daily. 11/05/15  Yes Angiulli, Lavon Paganini, PA-C  metoprolol tartrate (LOPRESSOR) 25 MG tablet Take 25 mg 2 (two) times daily by mouth.   Yes [provider]  omeprazole ([place])  20 MG capsule Take 1 capsule (20 mg total) by mouth 2 (two) times daily. 11/05/15  Yes Angiulli, Lavon Paganini, PA-C  ondansetron (ZOFRAN-ODT) 4 MG disintegrating tablet Take 4 mg every 8 (eight) hours as needed by mouth for nausea or vomiting.   Yes [provider]  oxazepam (SERAX) 15 MG capsule Take 1 capsule 2 (two) times daily by mouth.  11/17/15  Yes [provider]  senna (SENOKOT) 8.6 MG TABS tablet Take 1 tablet by mouth.   Yes [provider]  sucralfate (CARAFATE) 1 g tablet Take 1 g 4 (four) times daily by mouth.  10/06/17  Yes [provider]  traMADol (ULTRAM) 50 MG tablet Take every 6 (six) hours as needed by mouth.   Yes [provider]  dexamethasone (DECADRON) 4 MG tablet Take 1 tablet (4 mg total) daily by mouth. Patient not taking: Reported on 09/30/2017 10/11/17   Lloyd Huger, MD  diphenhydramine-acetaminophen (TYLENOL PM) 25-500 MG TABS tablet Take 1 tablet at bedtime as needed by mouth.    [provider]  tiZANidine (ZANAFLEX) 2 MG tablet 1 mg bedtime and every 8 hours as needed 11/05/15   Angiulli, Lavon Paganini, PA-C   Allergies  Allergen Reactions  . Morphine And Related Nausea And Vomiting  . Oxycodone Itching   Review of Systems  Unable to perform ROS   Physical Exam  Constitutional: No distress.  Resting with eyes closed. Unresponsive to voice. Per nursing clinches teeth and will not  take oral medications.   Pulmonary/Chest:  Even and unlabored.   Musculoskeletal: She exhibits no edema.  Skin:  Warm and dry.    Vital Signs: BP (!) 171/45 (BP Location: Right Arm)   Pulse 98   Temp 100 F (37.8 C) (Axillary)   Resp 18   Ht 5' (1.524 m)   Wt 50.3 kg (110 lb 14.4 oz)   SpO2 96%   BMI 21.66 kg/m  Pain Assessment: PAINAD   Pain Score: 0-No pain   SpO2: SpO2: 96 % O2 Device:SpO2: 96 % O2 Flow Rate: .O2 Flow Rate (L/min): 2 L/min  IO: Intake/output summary:   Intake/Output Summary (Last 24 hours) at 10/15/2017 1449 Last data filed at 10/15/2017 1432 Gross per 24 hour  Intake 1355 ml  Output 650 ml  Net 705 ml    LBM: Last BM Date: 10/11/17 Baseline Weight: Weight: 49.9 kg (110 lb) Most recent weight: Weight: 50.3 kg (110 lb 14.4 oz)     Palliative Assessment/Data: 20%     Time In: 12:00 Time Out: 3:00 Time Total: 3 hours Greater than 50%  of this time was spent counseling and coordinating care related to the above assessment and plan.  Plan discussed in detail with Dr. Tressia Miners and primary RN.   Signed by: Asencion Gowda, NP   Please contact Palliative Medicine Team phone at [phone] for questions and concerns.  For individual provider: See Shea Evans

## 2017-10-15 NOTE — Care Management Obs Status (Signed)
[facility] NOTIFICATION   Patient Details  Name: PARRIE RASCO MRN: [id number] Date of Birth: 1927-04-29   Medicare Observation Status Notification Given:  Yes Copy delivered/explained to son, his wife, and patient's daughter. Patient was not able to understand- pain and now resting. Family preferred not to sign.    Marshell Garfinkel, RN 10/15/2017, 12:43 PM

## 2017-10-15 NOTE — Progress Notes (Signed)
[facility] [place] NAME: Theresa Harris    MR#:  [id number]  DATE OF BIRTH:  [birth year]  SUBJECTIVE:  CHIEF COMPLAINT:   Chief Complaint  Patient presents with  . Altered Mental Status   - patient with metastatic cancer- likely primary from lung - now in distress from pain, discomfort from foley catheter  REVIEW OF SYSTEMS:  Review of Systems  Unable to perform ROS: Severity of pain    DRUG ALLERGIES:   Allergies  Allergen Reactions  . Morphine And Related Nausea And Vomiting  . Oxycodone Itching    VITALS:  Blood pressure (!) 171/45, pulse 98, temperature 100 F (37.8 C), temperature source Axillary, resp. rate 18, height 5' (1.524 m), weight 50.3 kg (110 lb 14.4 oz), SpO2 96 %.  PHYSICAL EXAMINATION:  Physical Exam  GENERAL:  83 y.o.-year-old elderly patient lying in the bed in significant distress secondary to pain.  EYES: Pupils equal, round, reactive to light and accommodation. No scleral icterus. Extraocular muscles intact.  HEENT: Head atraumatic, normocephalic. Oropharynx and nasopharynx clear.  NECK:  Supple, no jugular venous distention. No thyroid enlargement, no tenderness.  LUNGS: Normal breath sounds bilaterally, no wheezing, rales,rhonchi or crepitation. No use of accessory muscles of respiration. Decreased basilar breath sounds CARDIOVASCULAR: S1, S2 normal. No rubs, or gallops. 2/6 systolic murmur is present ABDOMEN: Soft, nontender, nondistended. Bowel sounds present. No organomegaly or mass.  EXTREMITIES: No pedal edema, cyanosis, or clubbing.  NEUROLOGIC: able to move all extremities, very restless in bed, in distress secondary to pain. Not following commands PSYCHIATRIC: The patient is alert and in distress SKIN: No obvious rash, lesion, or ulcer.    LABORATORY PANEL:   CBC Recent Labs  Lab 10/15/17 0449  WBC 17.1*  HGB 10.8*  HCT 34.1*  PLT 208    ------------------------------------------------------------------------------------------------------------------  Chemistries  Recent Labs  Lab 10/15/17 0449  NA 143  K 4.4  CL 111  CO2 24  GLUCOSE 98  BUN 35*  CREATININE 0.65  CALCIUM 10.0  AST 24  ALT 13*  ALKPHOS 68  BILITOT 0.7   ------------------------------------------------------------------------------------------------------------------  Cardiac Enzymes No results for input(s): TROPONINI in the last 168 hours. ------------------------------------------------------------------------------------------------------------------  RADIOLOGY:  Dg Chest 1 View  Result Date: 10/13/2017 CLINICAL DATA:  Shortness of breath, chest pain EXAM: CHEST 1 VIEW COMPARISON:  Chest CT 10/03/2017 FINDINGS: Elevation of the right hemidiaphragm. Right suprahilar upper lobe mass again noted as seen on recent CT. Heart is normal size. Left lung is clear. No effusions or acute bony abnormality. IMPRESSION: Right hilar/suprahilar mass again noted. Elevation the right hemidiaphragm. Electronically Signed   By: Rolm Baptise M.D.   On: 10/10/2017 09:24   Ct Head Wo Contrast  Result Date: 10/25/2017 CLINICAL DATA:  Altered level of consciousness. Urinary tract infection. Personal history of lung cancer. EXAM: CT HEAD WITHOUT CONTRAST TECHNIQUE: Contiguous axial images were obtained from the base of the skull through the vertex without intravenous contrast. COMPARISON:  MRI brain 10/11/2017. FINDINGS: Brain: Mild atrophy and white matter changes are again noted. No acute cortical infarct, hemorrhage, or mass lesion is present. Remote lacunar infarcts involving the right thalamus and left acute a man of are noted. Basal ganglia are otherwise intact. The insular ribbon is normal. No acute or focal cortical lesion is present. A right frontal convexity meningioma is stable. Brainstem and cerebellum are normal. Vascular: Atherosclerotic calcifications are  present within the cavernous internal carotid arteries bilaterally. There is  no hyperdense vessel. Skull: The calvarium is intact. No focal lytic or blastic lesions are present. Sinuses/Orbits: The paranasal sinuses and mastoid air cells are clear. Globes and orbits are within normal limits. IMPRESSION: 1. No acute intracranial abnormality or significant interval change. 2. Stable atrophy and white matter disease. 3. Stable calcified right frontal meningioma. Electronically Signed   By: San Morelle M.D.   On: 10/18/2017 09:33    EKG:   Orders placed or performed during the hospital encounter of 10/05/2017  . ED EKG  . ED EKG    ASSESSMENT AND PLAN:   83 year old female with multiple medical problems including congestive heart failure with diastolic dysfunction, atrial fibrillation, GERD, history of hemorrhagic stroke, Spinal stenosis, history of stroke, recent diagnosis of widespread metastatic malignancy with possible primary lung cancer presents to hospital secondary to weakness and intractable pain.  #1 significant pain-secondary to metastatic cancer and also urinary discomfort -We'll insert Foley catheter. Has bony lesions especially left hip for which palliative radiation was supposed to be scheduled this week. -Poor prognosis per oncology, hospice recommended -Palliative care consulted  #2 metastatic disease-with metastases to liver, bone and has a right hilar mass. Also has lymph nodes in the neck from which biopsy was done 3 days ago. 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 -Poor prognosis, not a candidate for chemotherapy -Consider hospice, if left hip is bothering will need palliative radiation  #3 leukocytosis-urine analysis negative for any infection, chest x-ray with right hilar mass, does not rule out any postobstructive pneumonia -However on steroids for possible brain metastases that can cause leukocytosis -No fevers, will hold off on  antibiotics at this time  #4 GERD-on Protonix  #5 DVT prophylaxis-Ted's and SCDs at this time  Further management based on palliative care discussion with the family -Daughter and brother and sister-in-law updated at bedside    All the records are reviewed and case discussed with Care Management/Social Workerr. Management plans discussed with the patient, family and they are in agreement.  CODE STATUS: DO NOT RESUSCITATE  TOTAL TIME TAKING CARE OF THIS PATIENT: 38 minutes.   POSSIBLE D/C IN 1-2 DAYS, DEPENDING ON CLINICAL CONDITION.   Gladstone Lighter M.D on 10/15/2017 at 2:02 PM  Between 7am to 6pm - Pager - [phone]  After 6pm go to [email or web address] - password [email or web address] [facility]  Office  [phone]  CC: Primary care physician; Kirk Ruths, MD

## 2017-10-15 NOTE — Progress Notes (Signed)
Palliative paged. Patient was in extreme early this morning- PRN IV Fentanyl given as ordered. Patient is unable to move in bed without excruciating pain. Per daughter at bedside, patient has not been eating but a bite here or there. Family refused to wake patient for medications/assessment/changing/turning this morning. New Fentanyl patch placed on right hip, old patch removed.

## 2017-10-15 NOTE — Consult Note (Signed)
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  Declining performance status, intractable pain.  INTERVAL HISTORY: Patient is an 83 year old female who was last evaluated in the cancer center on October 11, 2017 for widely metastatic malignancy. Preliminary pathology indicates it is likely squamous cell carcinoma of the lung.  Patient's performance status has significantly declined over the past several weeks and even more so in the past 3-4 days.  By report she is having intractable pain every time she moves.  Currently there is no family at bedside and patient is not arousable.  REVIEW OF SYSTEMS:   Review of Systems  Unable to perform ROS: Critical illness    PAST MEDICAL HISTORY: Past Medical History:  Diagnosis Date  . Anxiety   . Arthritis   . Atherosclerosis of abdominal aorta ([place])   . Atrial fibrillation ([place])   . Cancer ([place])    lung primary-mets  . CHF (congestive heart failure) ([place])   . Chronic diastolic heart failure ([place])   . Dysrhythmia    a. fib.  Marland Kitchen GERD (gastroesophageal reflux disease)   . Hemorrhagic stroke ([place])   . History of hiatal hernia   . HTN (hypertension)   . Spinal stenosis   . Stroke [facility])    09/2015    PAST SURGICAL HISTORY: Past Surgical History:  Procedure Laterality Date  . ABDOMINAL HYSTERECTOMY    . CARPAL TUNNEL RELEASE    . CHOLECYSTECTOMY    . HIP SURGERY    . KIDNEY SURGERY Right    "tacked up"  . KNEE SURGERY     right  . ROTATOR CUFF REPAIR Right   . TONSILLECTOMY      FAMILY HISTORY: History reviewed. No pertinent family history.  ADVANCED DIRECTIVES (Y/N):  @ADVDIR @  HEALTH MAINTENANCE: Social History   Tobacco  Use  . Smoking status: Former Smoker    Last attempt to quit: 10/13/1959    Years since quitting: 58.0  . Smokeless tobacco: Never Used  Substance Use Topics  . Alcohol use: No  . Drug use: No     Colonoscopy:  PAP:  Bone density:  Lipid panel:  Allergies  Allergen Reactions  . Morphine And Related Nausea And Vomiting  . Oxycodone Itching    Current Facility-Administered Medications  Medication Dose Route Frequency Provider Last Rate Last Dose  . 0.9 %  sodium chloride infusion   Intravenous Continuous Idelle Crouch, MD 100 mL/hr at 10/15/17 0754    . acetaminophen (TYLENOL) tablet 650 mg  650 mg Oral Q6H PRN Idelle Crouch, MD       Or  . acetaminophen (TYLENOL) suppository 650 mg  650 mg Rectal Q6H PRN Idelle Crouch, MD   650 mg at 10/15/17 1220  . bisacodyl (DULCOLAX) suppository 10 mg  10 mg Rectal Daily PRN Idelle Crouch, MD      . dexamethasone (DECADRON) tablet 4 mg  4 mg Oral Daily Idelle Crouch, MD      . docusate sodium (COLACE) capsule 100 mg  100 mg Oral BID Idelle Crouch, MD      . fentaNYL ([place] - dosed mcg/hr) patch 25 mcg  25 mcg Transdermal Q72H Idelle Crouch, MD   25  mcg at 10/15/17 0749  . heparin injection 5,000 Units  5,000 Units Subcutaneous Q8H Idelle Crouch, MD      . HYDROmorphone (DILAUDID) injection 1 mg  1 mg Intravenous Q4H PRN Gladstone Lighter, MD   1 mg at 10/15/17 1158  . LORazepam (ATIVAN) tablet 1 mg  1 mg Oral Q4H PRN Idelle Crouch, MD      . losartan (COZAAR) tablet 100 mg  100 mg Oral Daily Idelle Crouch, MD   100 mg at 10/22/2017 1515  . magnesium oxide (MAG-OX) tablet 400 mg  400 mg Oral Daily Idelle Crouch, MD      . metoprolol tartrate (LOPRESSOR) tablet 25 mg  25 mg Oral BID Idelle Crouch, MD      . ondansetron [facility]) tablet 4 mg  4 mg Oral Q6H PRN Idelle Crouch, MD       Or  . ondansetron (ZOFRAN) injection 4 mg  4 mg Intravenous Q6H PRN Idelle Crouch, MD   4 mg at 10/01/2017  1515  . pantoprazole (PROTONIX) injection 40 mg  40 mg Intravenous Q12H Idelle Crouch, MD   40 mg at 10/10/2017 2224  . sucralfate (CARAFATE) tablet 1 g  1 g Oral QID Idelle Crouch, MD      . tiZANidine (ZANAFLEX) tablet 2 mg  2 mg Oral Q6H PRN Idelle Crouch, MD      . traMADol Veatrice Bourbon) tablet 50 mg  50 mg Oral Q6H PRN Idelle Crouch, MD   50 mg at 10/20/2017 1515    OBJECTIVE: Vitals:   10/15/17 1214 10/15/17 1300  BP: (!) 171/45   Pulse: 98   Resp: 18   Temp: (!) 100.4 F (38 C) 100 F (37.8 C)  SpO2: 96%      Body mass index is 21.66 kg/m.    ECOG FS:4 - Bedbound  General: Ill-appearing, moderate distress secondary to pain. Eyes: Pink conjunctiva, anicteric sclera. HEENT: Normocephalic, moist mucous membranes, clear oropharnyx. Lungs: Clear to auscultation bilaterally. Heart: Regular rate and rhythm. No rubs, murmurs, or gallops. Abdomen: Soft, nontender, nondistended. No organomegaly noted, normoactive bowel sounds. Musculoskeletal: No edema, cyanosis, or clubbing. Neuro: Unable to arouse. Skin: No rashes or petechiae noted.    LAB RESULTS:  Lab Results  Component Value Date   NA 143 10/15/2017   K 4.4 10/15/2017   CL 111 10/15/2017   CO2 24 10/15/2017   GLUCOSE 98 10/15/2017   BUN 35 (H) 10/15/2017   CREATININE 0.65 10/15/2017   CALCIUM 10.0 10/15/2017   PROT 5.7 (L) 10/15/2017   ALBUMIN 2.4 (L) 10/15/2017   AST 24 10/15/2017   ALT 13 (L) 10/15/2017   ALKPHOS 68 10/15/2017   BILITOT 0.7 10/15/2017   GFRNONAA >60 10/15/2017   GFRAA >60 10/15/2017    Lab Results  Component Value Date   WBC 17.1 (H) 10/15/2017   NEUTROABS 15.7 (H) 10/03/2017   HGB 10.8 (L) 10/15/2017   HCT 34.1 (L) 10/15/2017   MCV 85.2 10/15/2017   PLT 208 10/15/2017     STUDIES: Dg Chest 1 View  Result Date: 10/26/2017 CLINICAL DATA:  Shortness of breath, chest pain EXAM: CHEST 1 VIEW COMPARISON:  Chest CT 10/03/2017 FINDINGS: Elevation of the right hemidiaphragm.  Right suprahilar upper lobe mass again noted as seen on recent CT. Heart is normal size. Left lung is clear. No effusions or acute bony abnormality. IMPRESSION: Right hilar/suprahilar mass again noted. Elevation the right hemidiaphragm. Electronically Signed  By: Rolm Baptise M.D.   On: 10/17/2017 09:24   Ct Head Wo Contrast  Result Date: 10/15/2017 CLINICAL DATA:  Altered level of consciousness. Urinary tract infection. Personal history of lung cancer. EXAM: CT HEAD WITHOUT CONTRAST TECHNIQUE: Contiguous axial images were obtained from the base of the skull through the vertex without intravenous contrast. COMPARISON:  MRI brain 10/11/2017. FINDINGS: Brain: Mild atrophy and white matter changes are again noted. No acute cortical infarct, hemorrhage, or mass lesion is present. Remote lacunar infarcts involving the right thalamus and left acute a man of are noted. Basal ganglia are otherwise intact. The insular ribbon is normal. No acute or focal cortical lesion is present. A right frontal convexity meningioma is stable. Brainstem and cerebellum are normal. Vascular: Atherosclerotic calcifications are present within the cavernous internal carotid arteries bilaterally. There is no hyperdense vessel. Skull: The calvarium is intact. No focal lytic or blastic lesions are present. Sinuses/Orbits: The paranasal sinuses and mastoid air cells are clear. Globes and orbits are within normal limits. IMPRESSION: 1. No acute intracranial abnormality or significant interval change. 2. Stable atrophy and white matter disease. 3. Stable calcified right frontal meningioma. Electronically Signed   By: San Morelle M.D.   On: 10/07/2017 09:33   Ct Chest W Contrast  Result Date: 10/03/2017 CLINICAL DATA:  Chest pain.  Short breath.  Weight loss. EXAM: CT CHEST WITH CONTRAST TECHNIQUE: Multidetector CT imaging of the chest was performed during intravenous contrast administration. CONTRAST:  9mL ISOVUE-300 IOPAMIDOL  (ISOVUE-300) INJECTION 61% COMPARISON:  Abdominal CT 07/20/2017 FINDINGS: Cardiovascular: No significant vascular findings. Normal heart size. No pericardial effusion. Mediastinum/Nodes: RIGHT hilar mass extends inot the right mediastinum measuring 3.7 x 4.2 cm. This mass surrounds the RIGHT upper lobe bronchus. Mass abuts the posterior wall of the superior vena cava. Lungs/Pleura: Several peripheral nodules in the RIGHT upper lobe and LEFT upper lobe. Small RIGHT lower lobe nodule measures 5 mm (image 78, series 3). Small RIGHT middle lobe nodule measures 5 mm (image 60, series 3). Scattered subcentimeter LEFT lung nodules. Upper Abdomen: Several large round hypoenhancing lesions in the LEFT and RIGHT hepatic lobes consistent with metastasis. Lesions are new from noncontrast CT 07/20/2017. Example lesion includes 4.3 cm lesion in the subcapsular RIGHT hepatic lobe. Adjacent 2.7 cm lesion RIGHT hepatic lobe. In LEFT lateral hepatic lobe a 1.0 cm and 1.4 cm lesion (images 137 and 109, series 2 ) Musculoskeletal: Severe degenerate changes shoulders. Sclerotic within the manubrium on lateral projection (image 86, series 6) this smudgy with potential cortical involvement. Loss of definition of the posterior wall vertebral body at T1 (image 86, series 6) is concerning). IMPRESSION: 1. RIGHT hilar mass consistent with bronchogenic carcinoma. Consider small cell carcinoma. 2. Postobstructive nodularity in the RIGHT upper lobe. 3. Scattered small bilateral pulmonary nodules are indeterminate. 4. Hypermetabolic bilobar HEPATIC METASTASIS. 5. Concern for skeletal metastasis in the sternum and T1 vertebral body. 6. FDG PET scan would be beneficial for biopsy planning and staging. These results will be called to the ordering clinician or representative by the Radiologist Assistant, and communication documented in the PACS or zVision Dashboard. Electronically Signed   By: Suzy Bouchard M.D.   On: 10/03/2017 17:04   Mr Jeri Cos IW Contrast  Result Date: 10/11/2017 CLINICAL DATA:  Lung carcinoma. Evaluation for intracranial metastatic disease. EXAM: MRI HEAD WITHOUT AND WITH CONTRAST TECHNIQUE: Multiplanar, multiecho pulse sequences of the brain and surrounding structures were obtained without and with intravenous contrast. CONTRAST:  2mL MULTIHANCE  GADOBENATE DIMEGLUMINE 094 MG/ML IV SOLN COMPARISON:  Brain MRI 10/25/2015 PET CT 10/10/2017 FINDINGS: Brain: The midline structures are normal. There is no acute infarct or acute hemorrhage. Unchanged right frontal convexity meningioma measuring 1.4 cm. No signal change of the underlying brain. Bilateral old gangliothalamic lacunar infarcts. Mild periventricular white matter hyperintensity. No age-advanced or lobar predominant atrophy. Hemosiderin deposition at site of prior right thalamic hemorrhage. Vascular: Major intracranial arterial and venous sinus flow voids are preserved. Skull and upper cervical spine: The visualized skull base, calvarium, upper cervical spine and extracranial soft tissues are normal. Sinuses/Orbits: The paranasal sinuses are clear and there is no mastoid or middle ear effusion. Bilateral lens replacements. Other: None IMPRESSION: 1. No intracranial metastatic disease or acute abnormality. 2. Unchanged 14 mm right frontal convexity meningioma. No abnormality of the underlying brain. Electronically Signed   By: Ulyses Jarred M.D.   On: 10/11/2017 14:32   Nm Pet Image Initial (pi) Skull Base To Thigh  Result Date: 10/10/2017 CLINICAL DATA:  Initial treatment strategy for lung mass. EXAM: NUCLEAR MEDICINE PET SKULL BASE TO THIGH TECHNIQUE: 12.06 mCi F-18 FDG was injected intravenously. Full-ring PET imaging was performed from the skull base to thigh after the radiotracer. CT data was obtained and used for attenuation correction and anatomic localization. FASTING BLOOD GLUCOSE:  Value: 90 mg/dl COMPARISON:  None. FINDINGS: NECK: There is a left supraclavicular  lymph node measuring 8 mm within SUV max equal to 3.4. CHEST: Large right upper lobe paramediastinal mass with right paratracheal invasion is identified measuring 4.7 cm with an SUV max equal to 21.9. Small pulmonary nodules are identified within both lungs. Most of these are too small to reliably characterize by PET-CT. Index nodule within the anteromedial right upper lobe measures 8 mm within SUV max equal to 4.1. No hypermetabolic axillary lymph nodes. Mild cardiac enlargement. Aortic atherosclerosis. No pericardial effusion. ABDOMEN/PELVIS: Multifocal hypermetabolic liver metastases identified. The largest lesion is in the right lobe of liver measuring 8.4 within SUV max equal to 20.98. No abnormal uptake within the adrenal glands. The spleen is unremarkable. No kidney mass or hydronephrosis visualized. Aortic atherosclerosis without aneurysm noted. Within the porta caval region there is a focal area of hypermetabolism within SUV max equal to 4.9. SKELETON: Multifocal hypermetabolic metastases identified within skeletal muscle. Mass within the upper posterior left chest wall measures 6.9 cm and has an SUV max equal to 4.2. Index lesion within the right gluteus musculature measures approximately 3 cm and has an SUV max equal to 8.56. Extensive multifocal hypermetabolic lytic bone metastases are identified. Of possible neurologic significance is a lesion involving to T1 vertebra which has an SUV max equal to 12.12. Other intensely hypermetabolic spinal lesions in by T7, T10 and T12. Large destructive lesion involving the left iliac bone measures approximately 5.2 cm and has an SUV max equal to 20.16. Bilateral hypermetabolic acetabular lesions are identified with underlying pathologic fractures, image number 207 and image 206 of series 3. SUV max associated with the right acetabulum equals 11.38. IMPRESSION: 1. Examination is positive for large hypermetabolic mass within the medial right upper lobe with associated  mediastinal invasion. 2. Numerous small bilateral pulmonary nodules compatible with metastatic disease. 3. Extensive liver metastasis 4. Widespread hypermetabolic lytic bone metastasis. Lesions involving the thoracic spine and pelvis may be of neurologic and/or orthopedic significance. 5. Multifocal skeletal muscle metastases. Electronically Signed   By: Kerby Moors M.D.   On: 10/10/2017 14:52   [place] Core Biopsy (lymph Nodes)  Result Date: 10/12/2017 CLINICAL DATA:  Central right lung mass with metastatic disease to lymph nodes, liver, bone and soft tissue including large muscular and soft tissue metastases to the left neck and back. EXAM: ULTRASOUND GUIDED CORE BIOPSY OF LEFT NECK SOFT TISSUE MASS MEDICATIONS: 1.0 mg IV Versed; 50 mcg IV Fentanyl Total Moderate Sedation Time: 10 minutes. The patient's level of consciousness and physiologic status were continuously monitored during the procedure by Radiology nursing. PROCEDURE: The procedure, risks, benefits, and alternatives were explained to the patient and her daughter. Questions regarding the procedure were encouraged and answered. The patient's daughter understands and consents to the procedure. A time out was performed prior to initiating the procedure. The left base of neck was prepped with chlorhexidine in a sterile fashion, and a sterile drape was applied covering the operative field. A sterile gown and sterile gloves were used for the procedure. Local anesthesia was provided with 1% Lidocaine. Ultrasound was performed of the left neck and upper back to localize soft tissue masses. A mass at the posterior base of the left neck was chosen for sampling. A total of 4 separate 18 gauge core biopsy samples were obtained and submitted in formalin. COMPLICATIONS: None. FINDINGS: Lobulated soft tissue mass at the base of the left neck posteriorly likely invades the trapezius muscle and measures approximately 3.9 x 1.6 x 2.6 cm. Solid tissue was obtained from  the mass. IMPRESSION: Ultrasound guided soft tissue mass biopsy of a metastatic soft tissue lesion likely invading the left trapezius muscle. This mass measures approximately 4 cm in maximal diameter. Solid tissue was obtained. Electronically Signed   By: Aletta Edouard M.D.   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  Declining performance status, intractable pain.  PLAN:    1.  Right hilar mass: PET scan and MRI results reviewed independently with widely metastatic disease in bone and liver. Patient does not have any brain metastasis.  Preliminary biopsy results consistent with squamous cell carcinoma of the lung, but final pathology is pending.   Previously it was discussed with the patient and her daughter that treatment would be difficult given her advanced age and declining performance status.  Hospice was also mentioned at her previous clinic visit.  Given her current condition and worsening performance status, would recommend hospice care, possibly at the hospice home, at this time.  Palliative care consult is pending.   2.  Pain: Agree with current narcotic regimen.  If patient agrees to hospice, it is possible to still pursue palliative XRT.  Consider radiation oncology consult to further discuss.  Palliative care consult is pending as above.  I will be out of town the remainder of the week.  Please contact the oncologist on-call, Dr. Janese Banks, if there are any further questions or concerns.  Lloyd Huger, MD   10/15/2017 1:28 PM

## 2017-10-16 ENCOUNTER — Telehealth: Payer: Self-pay | Admitting: *Deleted

## 2017-10-16 DIAGNOSIS — Z515 Encounter for palliative care: Secondary | ICD-10-CM | POA: Diagnosis present

## 2017-10-16 DIAGNOSIS — I4891 Unspecified atrial fibrillation: Secondary | ICD-10-CM | POA: Diagnosis present

## 2017-10-16 DIAGNOSIS — K219 Gastro-esophageal reflux disease without esophagitis: Secondary | ICD-10-CM | POA: Diagnosis present

## 2017-10-16 DIAGNOSIS — I700 Atherosclerosis of aorta: Secondary | ICD-10-CM | POA: Diagnosis present

## 2017-10-16 DIAGNOSIS — C799 Secondary malignant neoplasm of unspecified site: Secondary | ICD-10-CM | POA: Diagnosis not present

## 2017-10-16 DIAGNOSIS — G893 Neoplasm related pain (acute) (chronic): Secondary | ICD-10-CM | POA: Diagnosis present

## 2017-10-16 DIAGNOSIS — C3491 Malignant neoplasm of unspecified part of right bronchus or lung: Secondary | ICD-10-CM | POA: Diagnosis present

## 2017-10-16 DIAGNOSIS — Z8673 Personal history of transient ischemic attack (TIA), and cerebral infarction without residual deficits: Secondary | ICD-10-CM | POA: Diagnosis not present

## 2017-10-16 DIAGNOSIS — C787 Secondary malignant neoplasm of liver and intrahepatic bile duct: Secondary | ICD-10-CM | POA: Diagnosis present

## 2017-10-16 DIAGNOSIS — K7460 Unspecified cirrhosis of liver: Secondary | ICD-10-CM | POA: Diagnosis present

## 2017-10-16 DIAGNOSIS — E860 Dehydration: Secondary | ICD-10-CM | POA: Diagnosis present

## 2017-10-16 DIAGNOSIS — R509 Fever, unspecified: Secondary | ICD-10-CM | POA: Diagnosis not present

## 2017-10-16 DIAGNOSIS — E43 Unspecified severe protein-calorie malnutrition: Secondary | ICD-10-CM | POA: Diagnosis present

## 2017-10-16 DIAGNOSIS — D72829 Elevated white blood cell count, unspecified: Secondary | ICD-10-CM | POA: Diagnosis not present

## 2017-10-16 DIAGNOSIS — R52 Pain, unspecified: Secondary | ICD-10-CM | POA: Diagnosis not present

## 2017-10-16 DIAGNOSIS — Z85118 Personal history of other malignant neoplasm of bronchus and lung: Secondary | ICD-10-CM | POA: Diagnosis not present

## 2017-10-16 DIAGNOSIS — Z79891 Long term (current) use of opiate analgesic: Secondary | ICD-10-CM | POA: Diagnosis not present

## 2017-10-16 DIAGNOSIS — G9341 Metabolic encephalopathy: Secondary | ICD-10-CM | POA: Diagnosis present

## 2017-10-16 DIAGNOSIS — Z66 Do not resuscitate: Secondary | ICD-10-CM | POA: Diagnosis present

## 2017-10-16 DIAGNOSIS — Z9049 Acquired absence of other specified parts of digestive tract: Secondary | ICD-10-CM | POA: Diagnosis not present

## 2017-10-16 DIAGNOSIS — R627 Adult failure to thrive: Secondary | ICD-10-CM | POA: Diagnosis present

## 2017-10-16 DIAGNOSIS — F419 Anxiety disorder, unspecified: Secondary | ICD-10-CM | POA: Diagnosis present

## 2017-10-16 DIAGNOSIS — I110 Hypertensive heart disease with heart failure: Secondary | ICD-10-CM | POA: Diagnosis present

## 2017-10-16 DIAGNOSIS — I5032 Chronic diastolic (congestive) heart failure: Secondary | ICD-10-CM | POA: Diagnosis present

## 2017-10-16 DIAGNOSIS — C7951 Secondary malignant neoplasm of bone: Secondary | ICD-10-CM | POA: Diagnosis present

## 2017-10-16 DIAGNOSIS — Z90710 Acquired absence of both cervix and uterus: Secondary | ICD-10-CM | POA: Diagnosis not present

## 2017-10-16 LAB — SURGICAL PATHOLOGY

## 2017-10-16 MED ORDER — LORAZEPAM 2 MG/ML IJ SOLN
1.0000 mg | INTRAMUSCULAR | Status: DC | PRN
  Administered 2017-10-16: 1 mg via INTRAVENOUS
  Filled 2017-10-16: qty 1

## 2017-10-17 ENCOUNTER — Ambulatory Visit: Payer: PPO

## 2017-10-17 ENCOUNTER — Inpatient Hospital Stay: Payer: PPO | Admitting: Oncology

## 2017-10-27 NOTE — Progress Notes (Addendum)
Daily Progress Note   Patient Name: Theresa Harris       Date: 10/20/2017 DOB: 17-Nov-1927  Age: 83 y.o. MRN#: [id number] Attending Physician: Gladstone Lighter, MD Primary Care Physician: Kirk Ruths, MD Admit Date: 10/11/2017  Reason for Consultation/Follow-up: Establishing goals of care and Terminal Care  Subjective: Theresa Harris is resting in bed. Family at bedside. From yesterday's meeting, family wanted to discuss plans to take Theresa Harris home with hospice, but today would like her to stay in hospital if possible for end of life care. Theresa Harris appears to be actively dying.  Per family, overnight she slept well, and then awoke and began moaning and then yelling for her deceased mother. She received a dose of Dilaudid overnight. Currently, her extremities are warm to the touch. Her eyes are half open. She does not respond to verbal or touch stimuli.She has overall even and unlabored respirations which at times has pauses.    1:00pm: In to see patient and family, patient has just died. Support offered to family.    Length of Stay: 0  Current Medications: Scheduled Meds:  . dexamethasone  4 mg Oral Daily  . fentaNYL  25 mcg Transdermal Q72H    Continuous Infusions:   PRN Meds: acetaminophen **OR** acetaminophen, bisacodyl, HYDROmorphone (DILAUDID) injection, LORazepam, ondansetron **OR** ondansetron (ZOFRAN) IV  Physical Exam  Constitutional: No distress.  Eyes:  Eyes half open.   Neurological:  Does not respond to voice or touch stimuli.  Skin: Skin is dry.            Vital Signs: BP (!) 171/45 (BP Location: Right Arm)   Pulse 98   Temp 100 F (37.8 C) (Axillary)   Resp 18   Ht 5' (1.524 m)   Wt 50.3 kg (110 lb 14.4 oz)   SpO2 96%   BMI 21.66 kg/m  SpO2:  SpO2: 96 % O2 Device: O2 Device: Nasal Cannula O2 Flow Rate: O2 Flow Rate (L/min): 2 L/min  Intake/output summary:   Intake/Output Summary (Last 24 hours) at 2017-10-20 0844 Last data filed at 2017-10-20 0357 Gross per 24 hour  Intake 0 ml  Output 950 ml  Net -950 ml   LBM: Last BM Date: 10/11/17 Baseline Weight: Weight: 49.9 kg (110 lb) Most recent weight: Weight: 50.3 kg (110 lb 14.4 oz)  Palliative Assessment/Data: 10%      Patient Active Problem List   Diagnosis Date Noted  . Metastatic cancer ([place]) 10/01/2017  . Failure to thrive in adult 09/28/2017  . Dehydration 10/10/2017  . Generalized pain 10/09/2017  . Mass of right lung 10/05/2017  . Osteoarthritis of left knee 12/21/2015  . Nontraumatic subcortical hemorrhage of right cerebral hemisphere ([place])   . Chronic diastolic congestive heart failure ([place]) 10/28/2015  . Essential hypertension 10/28/2015  . Ataxia   . Sensation alteration, late effect of cerebrovascular disease   . Aneurysm, cerebral, nonruptured 10/26/2015  . Meningioma ([place]) 10/26/2015  . ICH (intracerebral hemorrhage) ([place]) 10/22/2015    Palliative Care Assessment & Plan   Patient Profile: Patient is an 83 year old female with widely metastatic malignancy. Patient's performance status has significantly declined over the past several weeks and even more so in the past 3-4 days. She has continued to decline during hospitalization. Upon admission, she had intractable pain every time she moves. Pain is controlled at this time.     Assessment: Theresa Harris is receiving comfort care.   Recommendations/Plan:  Patient is anticipated hospital death.   Comfort measures in place.   Goals of Care and Additional Recommendations:  Limitations on Scope of Treatment: Full Comfort Care  Code Status:    Code Status Orders  (From admission, onward)        Start     Ordered   10/12/2017 1420  Do not attempt resuscitation (DNR)  Continuous      Question Answer Comment  In the event of cardiac or respiratory ARREST Do not call a "code blue"   In the event of cardiac or respiratory ARREST Do not perform Intubation, CPR, defibrillation or ACLS   In the event of cardiac or respiratory ARREST Use medication by any route, position, wound care, and other measures to relive pain and suffering. May use oxygen, suction and manual treatment of airway obstruction as needed for comfort.      10/01/2017 1419    Code Status History    Date Active Date Inactive Code Status Order ID Comments User Context   10/27/2015 18:38 11/06/2015 14:01 Full Code [id number]  Elizabeth Sauer Inpatient   10/27/2015 18:38 10/27/2015 18:38 Full Code [id number]  Elizabeth Sauer Inpatient   10/22/2015 18:36 10/27/2015 18:38 Full Code [id number]  Wallie Char Inpatient       Prognosis:   Hours - Days  Discharge Planning:  Anticipated Hospital Death  Care plan was discussed with Dr. Tressia Miners and primary RN.   Thank you for allowing the Palliative Medicine Team to assist in the care of this patient.   Time In: 8:00 1:00 Time Out: 9:05 1:30 Total Time 65 min 30min Prolonged Time Billed  yes       Greater than 50%  of this time was spent counseling and coordinating care related to the above assessment and plan.  Asencion Gowda, NP 2017-10-22 9:07 AM Office: [phone] 7am-7pm  Please see Amion for pager number and availability  Call primary team after hours  Please contact Palliative Medicine Team phone at [phone] for questions and concerns.

## 2017-10-27 NOTE — Progress Notes (Signed)
[facility] NAME: Doylene Splinter    MR#:  [id number]  DATE OF BIRTH:  04/23/1927  SUBJECTIVE:  CHIEF COMPLAINT:   Chief Complaint  Patient presents with  . Altered Mental Status   - Patient with metastatic cancer of unknown primary, likely lung. Admitted for pain control. Started on comfort measures yesterday. -Appears actively dying at this time. Family at bedside  REVIEW OF SYSTEMS:  Review of Systems  Unable to perform ROS: Mental status change    DRUG ALLERGIES:   Allergies  Allergen Reactions  . Morphine And Related Nausea And Vomiting  . Oxycodone Itching    VITALS:  Blood pressure (!) 171/45, pulse 98, temperature 100 F (37.8 C), temperature source Axillary, resp. rate 18, height 5' (1.524 m), weight 50.3 kg (110 lb 14.4 oz), SpO2 96 %.  PHYSICAL EXAMINATION:  Physical Exam  GENERAL:  83 y.o.-year-old elderly patient lying in the bed, appears tachypneic, but otherwise without any discomfort. Not responding. EYES: Pupils equal, round, with minimal reaction to light and accommodation. No scleral icterus. Extraocular muscles intact.  HEENT: Head atraumatic, normocephalic. Oropharynx and nasopharynx clear. Open-mouth breathing, dry mucous membranes. NECK:  Supple, no jugular venous distention. No thyroid enlargement, no tenderness.  LUNGS: Normal breath sounds bilaterally, no wheezing, rales,rhonchi or crepitation. Decreased breath sounds at the bases No use of accessory muscles of respiration. Decreased basilar breath sounds CARDIOVASCULAR: S1, S2 normal. No rubs, or gallops. 2/6 systolic murmur is present ABDOMEN: Soft, nontender, nondistended. Bowel sounds present. No organomegaly or mass.  EXTREMITIES: No pedal edema, cyanosis, or clubbing.  NEUROLOGIC: He appears comfortable and sedated, not following commands PSYCHIATRIC: The patient is sedated SKIN: No obvious rash, lesion, or ulcer.    LABORATORY PANEL:     CBC Recent Labs  Lab 10/15/17 0449  WBC 17.1*  HGB 10.8*  HCT 34.1*  PLT 208   ------------------------------------------------------------------------------------------------------------------  Chemistries  Recent Labs  Lab 10/15/17 0449  NA 143  K 4.4  CL 111  CO2 24  GLUCOSE 98  BUN 35*  CREATININE 0.65  CALCIUM 10.0  AST 24  ALT 13*  ALKPHOS 68  BILITOT 0.7   ------------------------------------------------------------------------------------------------------------------  Cardiac Enzymes No results for input(s): TROPONINI in the last 168 hours. ------------------------------------------------------------------------------------------------------------------  RADIOLOGY:  No results found.  EKG:   Orders placed or performed during the hospital encounter of 10/07/2017  . ED EKG  . ED EKG    ASSESSMENT AND PLAN:   83 year old female with multiple medical problems including congestive heart failure with diastolic dysfunction, atrial fibrillation, GERD, history of hemorrhagic stroke, Spinal stenosis, history of stroke, recent diagnosis of widespread metastatic malignancy with possible primary lung cancer presents to hospital secondary to weakness and intractable pain.  #1 generalized pain to metastatic cancer and also urinary discomfort -Urinary discomfort improved with insertion of Foley catheter. Has bony lesions from her metastatic disease. -Poor prognosis per oncology, hospice recommended -Palliative care consulted -Patient is on comfort care now  #2 metastatic disease-with metastases to liver, bone and has a right hilar mass. Also has lymph nodes in the neck from which biopsy was done 3 days ago. 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 Was on Decadron as outpatient. All medications except  comfort meds are discontinued at this time  #4 DVT prophylaxis-comfort care now   Family updated  at bedside. Patient is comfort care now. Not stable enough for transfer. We'll monitor   All the records are reviewed and case discussed with Care Management/Social Workerr. Management plans discussed with the patient, family and they are in agreement.  CODE STATUS: DO NOT RESUSCITATE  TOTAL TIME TAKING CARE OF THIS PATIENT: 28 minutes.   POSSIBLE D/C ?, DEPENDING ON CLINICAL CONDITION.   Asuncion Shibata M.D on 10/31/2017 at 11:30 AM  Between 7am to 6pm - Pager - [phone]  After 6pm go to [email or web address] - password [email or web address] [facility]  Office  [phone]  CC: Primary care physician; Kirk Ruths, MD

## 2017-10-27 NOTE — Progress Notes (Signed)
Nurse asked [place] to visit the family of a pt in [id number] who had just passed away in the presence of her family. CH met with the family at bedside. Family was calm and reflected on the life of the deceased. They talked about things they did growing up, the church, and the last experience they have with the deceased before she passed. Cristal, the Nurse practitioner, was present in the Rm. The family asked the [facility] to offer prayer for them, which he did with a ministry of presence.   11-09-2017 1300  Clinical Encounter Type  Visited With Patient;Patient and family together;Patient not available  Visit Type Initial;Psychological support;Spiritual support;Death  Referral From Nurse  Consult/Referral To Chaplain  Spiritual Encounters  Spiritual Needs Prayer;Emotional;Grief support

## 2017-10-27 NOTE — Death Summary Note (Signed)
   [place] at [facility]    Death Note   Theresa Harris FAO:[id number],ONG:[id number] is a 83 y.o. female, Outpatient Primary MD for the patient is Kirk Ruths, MD  In brief: 83 year old female with multiple medical problems including congestive heart failure with diastolic dysfunction, atrial fibrillation, GERD, history of hemorrhagic stroke, Spinal stenosis, history of stroke, recent diagnosis of widespread metastatic malignancy with possible primary lung cancer presents to hospital secondary to weakness and intractable pain.  #1 generalized pain to metastatic cancer and also urinary discomfort -Urinary discomfort improved with insertion of Foley catheter. Has bony lesions from her metastatic disease. -Poor prognosis per oncology, hospice recommended -Palliative care consulted -Patient was placed on comfort care  #2 metastatic disease-with metastases to liver, bone and has a right hilar mass. Also has lymph nodes in the neck from which biopsy was done 3 days ago. 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 -Poor prognosis, not a candidate for chemotherapy  #3 leukocytosis- no source of infection identified. Was on Decadron as outpatient. All medications except comfort meds are discontinued at this time  She was placed on comfort care and passed away on 10-21-17  Pronounced dead by RN Maddie on  2017-10-21    @    12:35PM               Cause of death:  1. Metastatic cancer - squamous cell carcinoma form lung 2. Diastolic CHF 3. Atrial fibrillation 4. Hemorrhagic stroke 5. Spinal stenosis    Lynna Zamorano M.D on 2017-10-21 at 12:44 PM  [place] [facility] [place]  Total clinical and documentation time for today Under 30 minutes

## 2017-10-27 NOTE — Telephone Encounter (Signed)
Results of left neck biopsy shows metastatic carcinoma favor Squamous.

## 2017-10-27 NOTE — Progress Notes (Signed)
A total of 2 hours spent with family providing reassurance and explanation of palliative vs hospice care.  Family very receptive to all information given.

## 2017-10-27 NOTE — Progress Notes (Signed)
[facility] [place] to room to pick up patient. Family present. 2 rings removed and given to Glenda Chroman, daughter. Family states glasses are in room and they will take them

## 2017-10-27 NOTE — Telephone Encounter (Signed)
Of note, patient expired at 12:35 PM today per chart notes

## 2017-10-27 NOTE — Plan of Care (Signed)
  Progressing Education: Knowledge of General Education information will improve 11-03-17 0256 - Progressing by Sonda Primes, RN Health Behavior/Discharge Planning: Ability to manage health-related needs will improve 11/03/17 0256 - Progressing by Sonda Primes, RN Clinical Measurements: Ability to maintain clinical measurements within normal limits will improve Nov 03, 2017 0256 - Progressing by Sonda Primes, RN Will remain free from infection Nov 03, 2017 0256 - Progressing by Sonda Primes, RN Diagnostic test results will improve 03-Nov-2017 0256 - Progressing by Sonda Primes, RN Respiratory complications will improve 03-Nov-2017 0256 - Progressing by Sonda Primes, RN Cardiovascular complication will be avoided Nov 03, 2017 0256 - Progressing by Sonda Primes, RN Activity: Risk for activity intolerance will decrease 11/03/2017 0256 - Progressing by Sonda Primes, RN Nutrition: Adequate nutrition will be maintained 11-03-2017 0256 - Progressing by Sonda Primes, RN Coping: Level of anxiety will decrease 2017-11-03 0256 - Progressing by Sonda Primes, RN Elimination: Will not experience complications related to bowel motility 03-Nov-2017 0256 - Progressing by Sonda Primes, RN Will not experience complications related to urinary retention 11-03-2017 0256 - Progressing by Sonda Primes, RN Pain Managment: General experience of comfort will improve November 03, 2017 0256 - Progressing by Sonda Primes, RN Safety: Ability to remain free from injury will improve 2017-11-03 0256 - Progressing by Sonda Primes, RN Skin Integrity: Risk for impaired skin integrity will decrease 2017/11/03 0256 - Progressing by Sonda Primes, RN Respiratory: Verbalizations of increased ease of respirations will increase 11-03-17 0256 - Progressing by Sonda Primes, RN Role Relationship: Family's ability to cope with current  situation will improve 11/03/17 0256 - Progressing by Sonda Primes, RN Ability to verbalize concerns, feelings, and thoughts to partner or family member will improve 11-03-2017 0256 - Progressing by Sonda Primes, RN Pain Management: Satisfaction with pain management regimen will improve 03-Nov-2017 0256 - Progressing by Sonda Primes, RN

## 2017-10-27 NOTE — Progress Notes (Signed)
Called to room by family member. This RN and Andre Lefort, RN pronounced the patient deceased at Nov 08, 2017 at 12:35. MD, [facility] notified. Family members witnessed death. CDS notified and patient is not acceptable for organ donation. Ammie Dalton, RN

## 2017-10-27 DEATH — deceased

## 2019-03-21 IMAGING — CT CT RENAL STONE PROTOCOL
3 of 4 series · 9 of 46 positions shown, 16 images · non-contrast
Comparison: None.

CLINICAL DATA: One week history of right lower back pain and spasms
radiating down the right leg.

EXAM:
CT ABDOMEN AND PELVIS WITHOUT CONTRAST
TECHNIQUE: Multidetector CT imaging of the abdomen and pelvis was performed
following the standard protocol without IV contrast.

[Series 4: lung bases · axial · 0.75mm/px · z∈[-165,-90]mm · 5 of 23 slices shown, 10 images]
[im 4/23  soft-tissue]
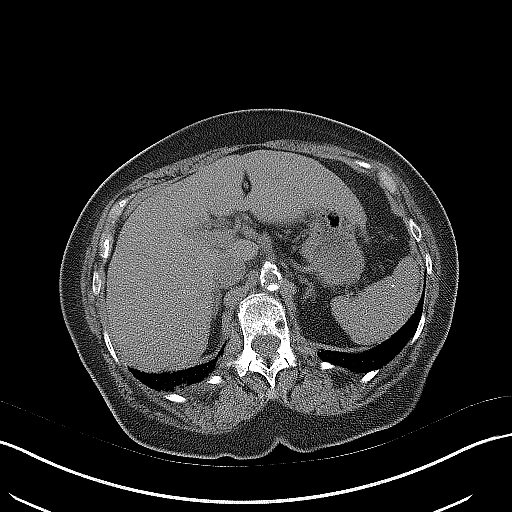
[im 4/23  bone]
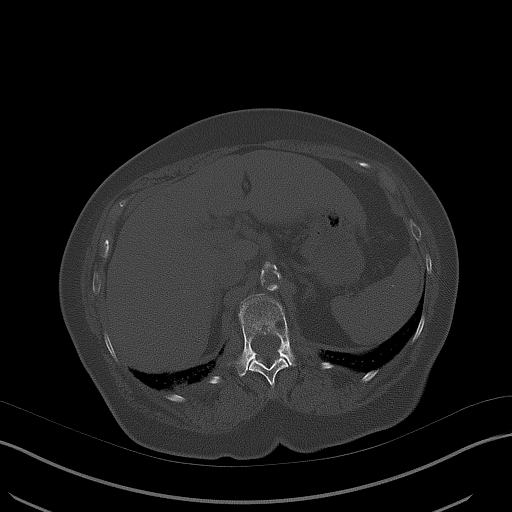
[im 8/23  soft-tissue]
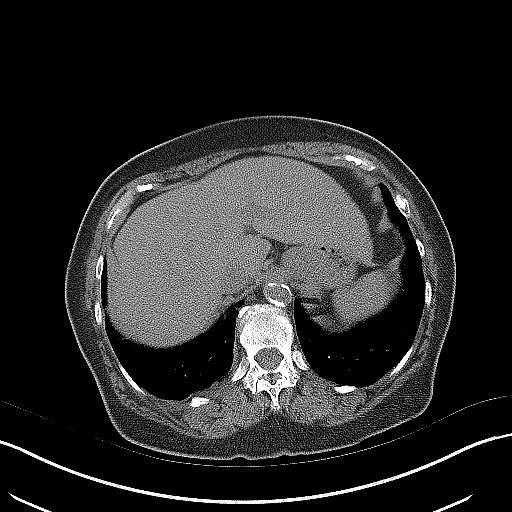
[im 8/23  lung]
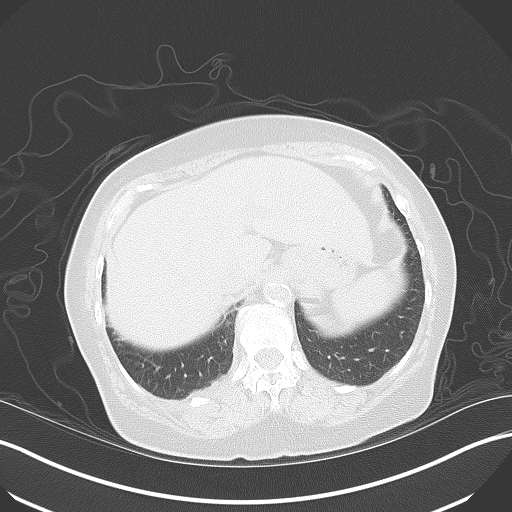
[im 12/23  soft-tissue]
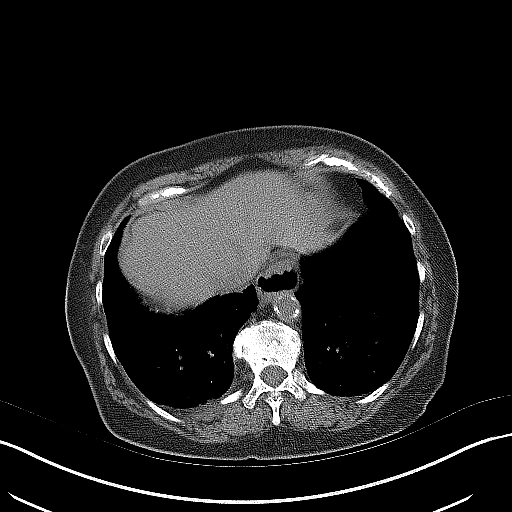
[im 12/23  lung]
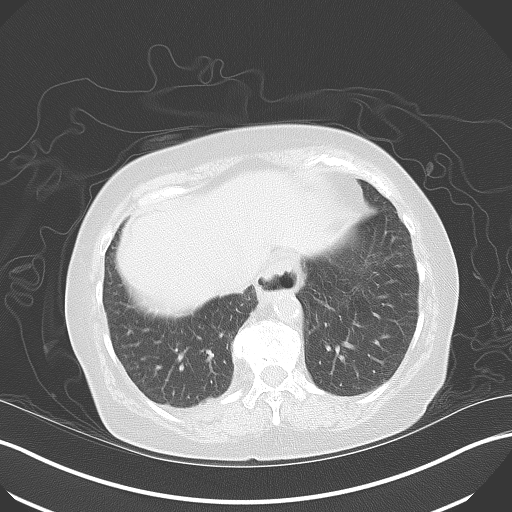
[im 15/23  soft-tissue]
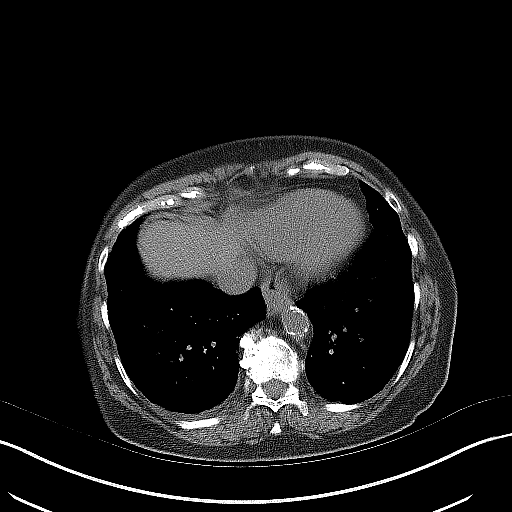
[im 15/23  lung]
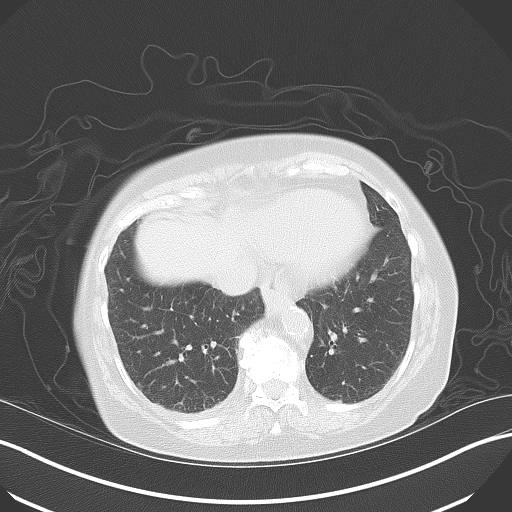
[im 19/23  soft-tissue]
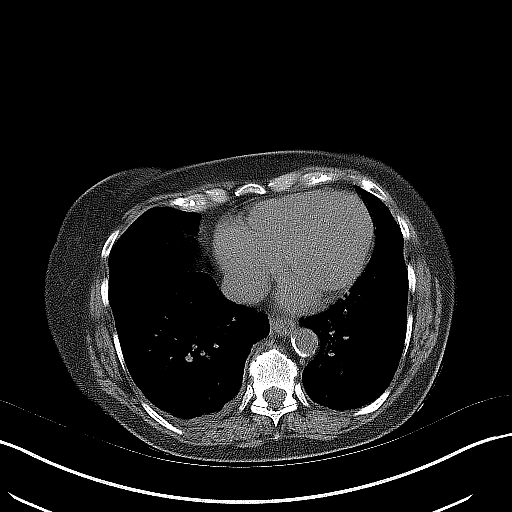
[im 19/23  lung]
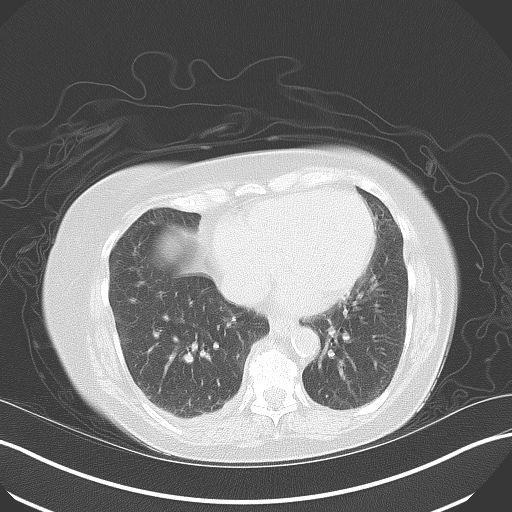

[Series 5: coronal · coronal · 0.79mm/px · 3 of 131 slices shown, 4 images]
[im 44/131  soft-tissue]
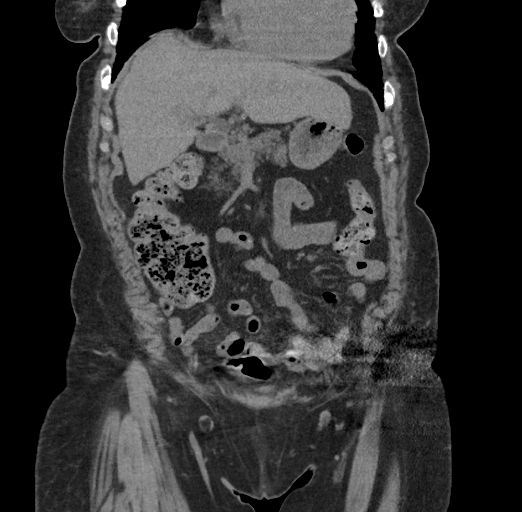
[im 58/131  soft-tissue]
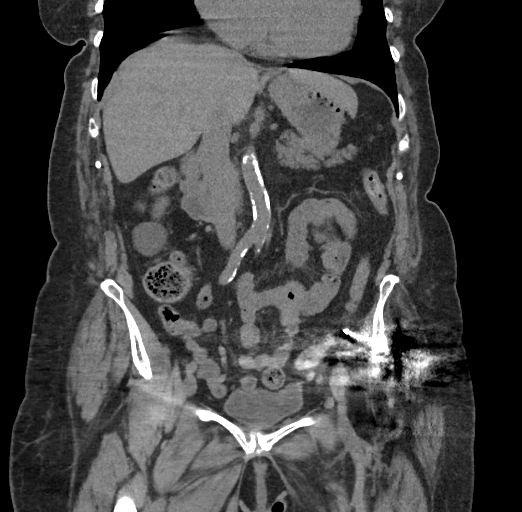
[im 58/131  bone]
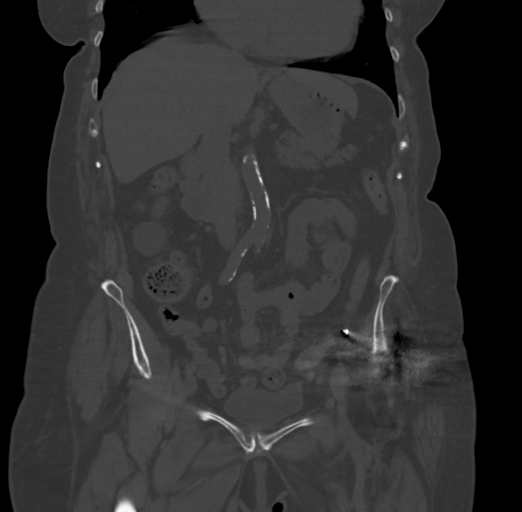
[im 73/131  soft-tissue]
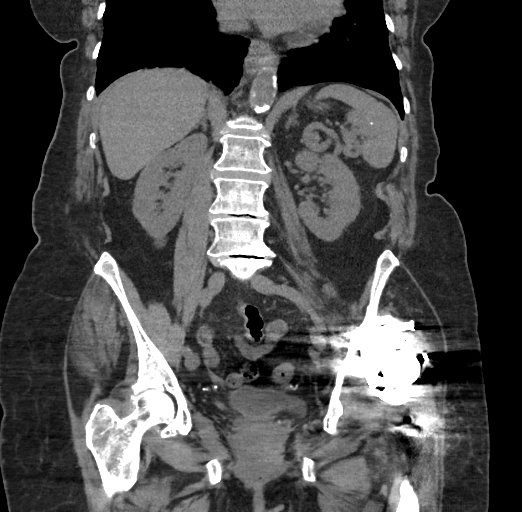

[Series 6: sagittal · sagittal · 0.62mm/px · 1 of 162 slices shown, 2 images]
[im 54/162  soft-tissue]
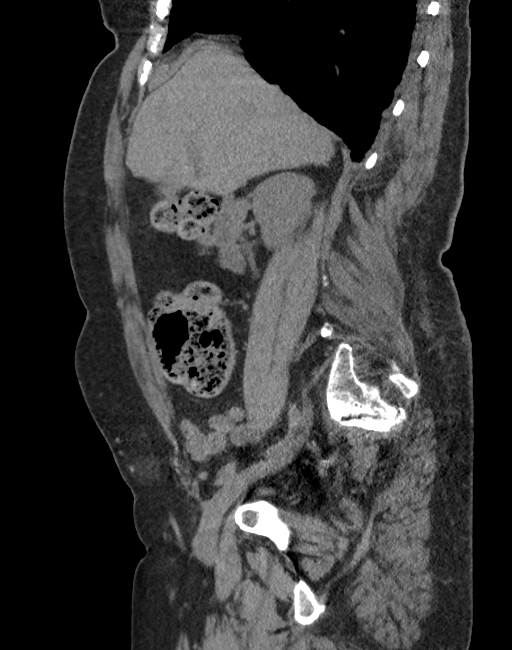
[im 54/162  bone]
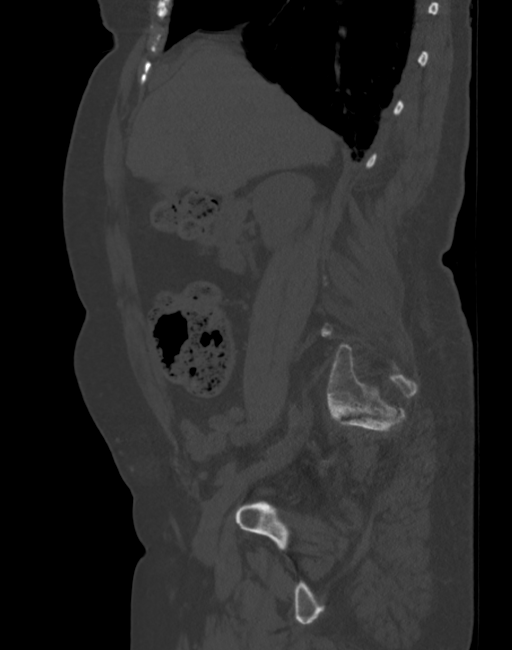

[9 of 46 positions shown; findings below may reference images not displayed]

FINDINGS: Lower chest:  Tiny right pleural effusion.  Small hiatal hernia.

Hepatobiliary: Subtle nodularity of the liver contour raises the
question of cirrhosis. Gallbladder surgically absent. No
intrahepatic or extrahepatic biliary dilation.

Pancreas: Subtle edema or inflammation is identified anterior to the
pancreatic head (well seen on coronal image 53 of series 5). No
focal mass lesion. No dilatation of the main duct. No
intraparenchymal cyst.

Spleen: No splenomegaly. No focal mass lesion.

Adrenals/Urinary Tract: No adrenal nodule or mass. No stones are
seen in either kidney. No ureteral stones are evident although the
distal portion of each ureter is obscured by streak artifact from
left hip replacement. Bladder is unremarkable. 3.2 cm homogeneous
exophytic lesion lower pole right kidney approaches water
attenuation and is likely a cyst. Tiny hypodensity in the interpolar
left kidney is too small to characterize.

Stomach/Bowel: Small hiatal hernia. Stomach otherwise unremarkable.
Duodenum is normally positioned as is the ligament of Treitz. No
small bowel wall thickening. No small bowel dilatation. The terminal
ileum is normal. The appendix is not visualized, but there is no
edema or inflammation in the region of the cecum. No gross colonic
mass. No colonic wall thickening. No substantial diverticular
change.

Vascular/Lymphatic: There is abdominal aortic atherosclerosis
without aneurysm. There is no gastrohepatic or hepatoduodenal
ligament lymphadenopathy. No intraperitoneal or retroperitoneal
lymphadenopathy. 8 mm short axis left common iliac lymph node is
upper normal. No pelvic sidewall lymphadenopathy.

Reproductive: Uterus surgically absent.  There is no adnexal mass.

Other: No intraperitoneal free fluid.

Musculoskeletal: Status post left total hip replacement although
difficult to assess due to streak artifact, the acetabular component
is laterally displaced in there are multiple fractured acetabular
component fixation screws. Sagittal reconstruction show grade 1
spondylolisthesis of L4 on 5. No evidence for lumbar spine
compression fracture.
IMPRESSION: 1. Apparent subtle edema/ inflammation in the region of the duodenum
and pancreatic head. Groove pancreatitis would be a consideration
based on imaging.
2. Status post left total hip replacement with displacement of the
acetabular component multiple fractured fixation screws associated
with the acetabular cup are evident and may be related to previous
arthroplasty as it appears that the the patient has had revision
surgery. One of the inferior acetabular screws on the current study
shows evidence of loosening.
3. Subtle nodularity of liver contour raises the question of
cirrhosis.
4. Trace right pleural effusion.
5. Small hiatal hernia.
6.  Aortic Atherosclerois ([id number]-170.0)
